# Patient Record
Sex: Male | Born: 1935 | Race: White | Hispanic: No | Smoking: Never smoker
Health system: Southern US, Community
[De-identification: ages and names within clinical notes are randomized; demographics above are authoritative.]

## PROBLEM LIST (undated history)

## (undated) DIAGNOSIS — D649 Anemia, unspecified: Secondary | ICD-10-CM

## (undated) DIAGNOSIS — S129XXA Fracture of neck, unspecified, initial encounter: Secondary | ICD-10-CM

## (undated) DIAGNOSIS — S069X9A Unspecified intracranial injury with loss of consciousness of unspecified duration, initial encounter: Secondary | ICD-10-CM

## (undated) DIAGNOSIS — C61 Malignant neoplasm of prostate: Secondary | ICD-10-CM

## (undated) DIAGNOSIS — I10 Essential (primary) hypertension: Secondary | ICD-10-CM

## (undated) DIAGNOSIS — N400 Enlarged prostate without lower urinary tract symptoms: Secondary | ICD-10-CM

## (undated) DIAGNOSIS — R1310 Dysphagia, unspecified: Secondary | ICD-10-CM

## (undated) DIAGNOSIS — H3530 Unspecified macular degeneration: Secondary | ICD-10-CM

## (undated) DIAGNOSIS — R296 Repeated falls: Secondary | ICD-10-CM

## (undated) DIAGNOSIS — S065X9A Traumatic subdural hemorrhage with loss of consciousness of unspecified duration, initial encounter: Secondary | ICD-10-CM

## (undated) DIAGNOSIS — H409 Unspecified glaucoma: Secondary | ICD-10-CM

## (undated) DIAGNOSIS — F329 Major depressive disorder, single episode, unspecified: Secondary | ICD-10-CM

## (undated) DIAGNOSIS — G4730 Sleep apnea, unspecified: Secondary | ICD-10-CM

## (undated) DIAGNOSIS — N3941 Urge incontinence: Secondary | ICD-10-CM

## (undated) DIAGNOSIS — H401190 Primary open-angle glaucoma, unspecified eye, stage unspecified: Secondary | ICD-10-CM

## (undated) DIAGNOSIS — S0292XA Unspecified fracture of facial bones, initial encounter for closed fracture: Secondary | ICD-10-CM

## (undated) DIAGNOSIS — M47812 Spondylosis without myelopathy or radiculopathy, cervical region: Secondary | ICD-10-CM

## (undated) DIAGNOSIS — S065XAA Traumatic subdural hemorrhage with loss of consciousness status unknown, initial encounter: Secondary | ICD-10-CM

## (undated) DIAGNOSIS — M1990 Unspecified osteoarthritis, unspecified site: Secondary | ICD-10-CM

## (undated) DIAGNOSIS — F0390 Unspecified dementia without behavioral disturbance: Secondary | ICD-10-CM

## (undated) DIAGNOSIS — S069XAA Unspecified intracranial injury with loss of consciousness status unknown, initial encounter: Secondary | ICD-10-CM

## (undated) HISTORY — PX: TURP VAPORIZATION: SUR1397

## (undated) HISTORY — DX: Traumatic subdural hemorrhage with loss of consciousness of unspecified duration, initial encounter: S06.5X9A

## (undated) HISTORY — PX: HERNIA REPAIR: SHX51

## (undated) HISTORY — PX: GALLBLADDER SURGERY: SHX652

## (undated) HISTORY — DX: Primary open-angle glaucoma, unspecified eye, stage unspecified: H40.1190

## (undated) HISTORY — DX: Unspecified macular degeneration: H35.30

## (undated) HISTORY — PX: APPENDECTOMY: SHX54

## (undated) HISTORY — DX: Fracture of neck, unspecified, initial encounter: S12.9XXA

## (undated) HISTORY — DX: Urge incontinence: N39.41

## (undated) HISTORY — DX: Malignant neoplasm of prostate: C61

## (undated) HISTORY — PX: TONSILLECTOMY: SUR1361

## (undated) HISTORY — DX: Unspecified fracture of facial bones, initial encounter for closed fracture: S02.92XA

## (undated) HISTORY — PX: CHOLECYSTECTOMY: SHX55

## (undated) HISTORY — DX: Benign prostatic hyperplasia without lower urinary tract symptoms: N40.0

## (undated) HISTORY — DX: Traumatic subdural hemorrhage with loss of consciousness status unknown, initial encounter: S06.5XAA

---

## 2006-12-10 HISTORY — PX: GLAUCOMA SURGERY: SHX656

## 2009-09-18 ENCOUNTER — Emergency Department: Payer: Self-pay | Admitting: Emergency Medicine

## 2009-11-26 ENCOUNTER — Encounter: Payer: Self-pay | Admitting: Internal Medicine

## 2009-12-09 ENCOUNTER — Encounter: Payer: Self-pay | Admitting: Internal Medicine

## 2010-04-18 ENCOUNTER — Emergency Department: Payer: Self-pay | Admitting: Emergency Medicine

## 2010-07-02 ENCOUNTER — Emergency Department: Payer: Self-pay | Admitting: Emergency Medicine

## 2010-07-24 ENCOUNTER — Ambulatory Visit: Payer: Self-pay | Admitting: Internal Medicine

## 2010-07-25 ENCOUNTER — Encounter: Payer: Self-pay | Admitting: Internal Medicine

## 2010-08-09 ENCOUNTER — Encounter: Payer: Self-pay | Admitting: Internal Medicine

## 2010-09-09 ENCOUNTER — Encounter: Payer: Self-pay | Admitting: Internal Medicine

## 2010-10-02 ENCOUNTER — Ambulatory Visit: Payer: Self-pay | Admitting: Internal Medicine

## 2010-10-10 ENCOUNTER — Encounter: Payer: Self-pay | Admitting: Internal Medicine

## 2010-11-09 ENCOUNTER — Encounter: Payer: Self-pay | Admitting: Internal Medicine

## 2010-12-10 ENCOUNTER — Encounter: Payer: Self-pay | Admitting: Internal Medicine

## 2010-12-29 HISTORY — PX: OTHER SURGICAL HISTORY: SHX169

## 2011-01-09 ENCOUNTER — Encounter: Payer: Self-pay | Admitting: Internal Medicine

## 2011-02-09 ENCOUNTER — Encounter: Payer: Self-pay | Admitting: Internal Medicine

## 2011-02-28 HISTORY — PX: OTHER SURGICAL HISTORY: SHX169

## 2011-03-12 ENCOUNTER — Encounter: Payer: Self-pay | Admitting: Internal Medicine

## 2011-03-30 LAB — BASIC METABOLIC PANEL
Anion Gap: 7 (ref 7–16)
BUN: 22 mg/dL — ABNORMAL HIGH (ref 7–18)
Chloride: 104 mmol/L (ref 98–107)
Co2: 28 mmol/L (ref 21–32)
Creatinine: 0.95 mg/dL (ref 0.60–1.30)
EGFR (African American): >60
Potassium: 3.7 mmol/L (ref 3.5–5.1)
Sodium: 139 mmol/L (ref 136–145)

## 2011-04-09 ENCOUNTER — Encounter: Payer: Self-pay | Admitting: Internal Medicine

## 2011-05-10 ENCOUNTER — Encounter: Payer: Self-pay | Admitting: Internal Medicine

## 2011-05-13 LAB — BASIC METABOLIC PANEL
Anion Gap: 8 (ref 7–16)
Calcium, Total: 8.2 mg/dL — ABNORMAL LOW (ref 8.5–10.1)
Chloride: 107 mmol/L (ref 98–107)
Co2: 27 mmol/L (ref 21–32)
Creatinine: 1.02 mg/dL (ref 0.60–1.30)
EGFR (African American): >60
EGFR (Non-African Amer.): >60
Glucose: 98 mg/dL (ref 65–99)
Sodium: 142 mmol/L (ref 136–145)

## 2011-06-09 ENCOUNTER — Encounter: Payer: Self-pay | Admitting: Internal Medicine

## 2011-07-10 ENCOUNTER — Encounter: Payer: Self-pay | Admitting: Internal Medicine

## 2011-08-09 ENCOUNTER — Encounter: Payer: Self-pay | Admitting: Internal Medicine

## 2011-08-23 DIAGNOSIS — H532 Diplopia: Secondary | ICD-10-CM | POA: Insufficient documentation

## 2011-09-09 ENCOUNTER — Encounter: Payer: Self-pay | Admitting: Internal Medicine

## 2011-10-10 ENCOUNTER — Encounter: Payer: Self-pay | Admitting: Internal Medicine

## 2011-11-09 ENCOUNTER — Encounter: Payer: Self-pay | Admitting: Internal Medicine

## 2011-12-07 LAB — BASIC METABOLIC PANEL
BUN: 24 mg/dL — ABNORMAL HIGH (ref 7–18)
Calcium, Total: 8.2 mg/dL — ABNORMAL LOW (ref 8.5–10.1)
EGFR (Non-African Amer.): >60
Glucose: 89 mg/dL (ref 65–99)
Potassium: 3.4 mmol/L — ABNORMAL LOW (ref 3.5–5.1)

## 2011-12-10 ENCOUNTER — Encounter: Payer: Self-pay | Admitting: Internal Medicine

## 2012-01-09 ENCOUNTER — Encounter: Payer: Self-pay | Admitting: Internal Medicine

## 2012-02-09 ENCOUNTER — Encounter: Payer: Self-pay | Admitting: Internal Medicine

## 2012-03-11 ENCOUNTER — Encounter: Payer: Self-pay | Admitting: Internal Medicine

## 2012-03-27 DIAGNOSIS — N400 Enlarged prostate without lower urinary tract symptoms: Secondary | ICD-10-CM

## 2012-03-27 DIAGNOSIS — R339 Retention of urine, unspecified: Secondary | ICD-10-CM | POA: Insufficient documentation

## 2012-03-27 DIAGNOSIS — N3941 Urge incontinence: Secondary | ICD-10-CM

## 2012-03-27 DIAGNOSIS — R350 Frequency of micturition: Secondary | ICD-10-CM | POA: Insufficient documentation

## 2012-03-27 HISTORY — DX: Benign prostatic hyperplasia without lower urinary tract symptoms: N40.0

## 2012-03-27 HISTORY — DX: Urge incontinence: N39.41

## 2012-04-08 ENCOUNTER — Encounter: Payer: Self-pay | Admitting: Internal Medicine

## 2012-04-27 ENCOUNTER — Ambulatory Visit: Payer: Self-pay

## 2012-05-09 ENCOUNTER — Encounter: Payer: Self-pay | Admitting: Internal Medicine

## 2012-05-22 DIAGNOSIS — N138 Other obstructive and reflux uropathy: Secondary | ICD-10-CM | POA: Insufficient documentation

## 2012-05-22 DIAGNOSIS — C61 Malignant neoplasm of prostate: Secondary | ICD-10-CM

## 2012-05-22 HISTORY — DX: Malignant neoplasm of prostate: C61

## 2012-06-08 ENCOUNTER — Encounter: Payer: Self-pay | Admitting: Internal Medicine

## 2012-07-09 ENCOUNTER — Encounter: Payer: Self-pay | Admitting: Internal Medicine

## 2012-08-08 ENCOUNTER — Encounter: Payer: Self-pay | Admitting: Internal Medicine

## 2012-08-10 LAB — BASIC METABOLIC PANEL
BUN: 26 mg/dL — ABNORMAL HIGH (ref 7–18)
Calcium, Total: 9.1 mg/dL (ref 8.5–10.1)
Creatinine: 1.13 mg/dL (ref 0.60–1.30)
EGFR (African American): >60
EGFR (Non-African Amer.): 59 — ABNORMAL LOW
Osmolality: 286 (ref 275–301)
Potassium: 3.8 mmol/L (ref 3.5–5.1)
Sodium: 141 mmol/L (ref 136–145)

## 2012-09-08 ENCOUNTER — Encounter: Payer: Self-pay | Admitting: Internal Medicine

## 2012-10-09 ENCOUNTER — Encounter: Payer: Self-pay | Admitting: Internal Medicine

## 2012-11-08 ENCOUNTER — Encounter: Payer: Self-pay | Admitting: Internal Medicine

## 2012-12-09 ENCOUNTER — Encounter: Payer: Self-pay | Admitting: Internal Medicine

## 2013-01-08 ENCOUNTER — Encounter: Payer: Self-pay | Admitting: Internal Medicine

## 2013-02-08 ENCOUNTER — Encounter: Payer: Self-pay | Admitting: Internal Medicine

## 2013-03-08 LAB — BASIC METABOLIC PANEL
ANION GAP: 3 — AB (ref 7–16)
BUN: 20 mg/dL — ABNORMAL HIGH (ref 7–18)
CALCIUM: 8.9 mg/dL (ref 8.5–10.1)
CREATININE: 0.99 mg/dL (ref 0.60–1.30)
Chloride: 106 mmol/L (ref 98–107)
Co2: 28 mmol/L (ref 21–32)
EGFR (African American): >60
Glucose: 97 mg/dL (ref 65–99)
Osmolality: 276 (ref 275–301)
POTASSIUM: 3.5 mmol/L (ref 3.5–5.1)
Sodium: 137 mmol/L (ref 136–145)

## 2013-03-11 ENCOUNTER — Encounter: Payer: Self-pay | Admitting: Internal Medicine

## 2013-04-08 ENCOUNTER — Encounter: Payer: Self-pay | Admitting: Internal Medicine

## 2013-05-09 ENCOUNTER — Encounter: Payer: Self-pay | Admitting: Internal Medicine

## 2013-06-08 ENCOUNTER — Encounter: Payer: Self-pay | Admitting: Internal Medicine

## 2013-07-09 ENCOUNTER — Encounter: Payer: Self-pay | Admitting: Internal Medicine

## 2013-07-18 ENCOUNTER — Ambulatory Visit: Payer: Self-pay | Admitting: Gerontology

## 2013-08-08 ENCOUNTER — Encounter: Payer: Self-pay | Admitting: Internal Medicine

## 2013-09-06 LAB — BASIC METABOLIC PANEL
Anion Gap: 8 (ref 7–16)
BUN: 19 mg/dL — ABNORMAL HIGH (ref 7–18)
CALCIUM: 8.1 mg/dL — AB (ref 8.5–10.1)
CREATININE: 1.07 mg/dL (ref 0.60–1.30)
Chloride: 109 mmol/L — ABNORMAL HIGH (ref 98–107)
Co2: 24 mmol/L (ref 21–32)
EGFR (African American): >60
EGFR (Non-African Amer.): >60
Glucose: 121 mg/dL — ABNORMAL HIGH (ref 65–99)
Osmolality: 285 (ref 275–301)
Potassium: 3.5 mmol/L (ref 3.5–5.1)
SODIUM: 141 mmol/L (ref 136–145)

## 2013-09-08 ENCOUNTER — Encounter: Payer: Self-pay | Admitting: Internal Medicine

## 2013-10-09 ENCOUNTER — Encounter: Payer: Self-pay | Admitting: Internal Medicine

## 2013-11-08 ENCOUNTER — Encounter: Payer: Self-pay | Admitting: Internal Medicine

## 2013-12-09 ENCOUNTER — Encounter: Payer: Self-pay | Admitting: Internal Medicine

## 2014-01-08 ENCOUNTER — Encounter: Payer: Self-pay | Admitting: Internal Medicine

## 2014-02-08 ENCOUNTER — Encounter: Payer: Self-pay | Admitting: Internal Medicine

## 2014-03-11 ENCOUNTER — Encounter: Payer: Self-pay | Admitting: Internal Medicine

## 2014-04-09 ENCOUNTER — Encounter
Admit: 2014-04-09 | Disposition: A | Discharge status: Home / Self Care | Payer: Self-pay | Attending: Internal Medicine | Admitting: Internal Medicine

## 2014-05-01 DIAGNOSIS — H4010X0 Unspecified open-angle glaucoma, stage unspecified: Secondary | ICD-10-CM | POA: Insufficient documentation

## 2014-05-10 ENCOUNTER — Encounter
Admit: 2014-05-10 | Disposition: A | Discharge status: Home / Self Care | Payer: Self-pay | Attending: Internal Medicine | Admitting: Internal Medicine

## 2014-05-28 LAB — BASIC METABOLIC PANEL
Anion Gap: 5 — ABNORMAL LOW (ref 7–16)
BUN: 28 mg/dL — ABNORMAL HIGH
CALCIUM: 7.8 mg/dL — AB
Chloride: 109 mmol/L
Co2: 25 mmol/L
Creatinine: 0.81 mg/dL
EGFR (African American): >60
Glucose: 86 mg/dL
POTASSIUM: 3.7 mmol/L
Sodium: 139 mmol/L

## 2014-06-09 ENCOUNTER — Encounter
Admission: RE | Admit: 2014-06-09 | Discharge: 2014-06-09 | Disposition: A | Discharge status: Home / Self Care | Payer: Medicare Other | Source: Ambulatory Visit | Attending: Internal Medicine | Admitting: Internal Medicine

## 2014-06-09 DIAGNOSIS — R350 Frequency of micturition: Secondary | ICD-10-CM | POA: Insufficient documentation

## 2014-06-09 DIAGNOSIS — R8290 Unspecified abnormal findings in urine: Secondary | ICD-10-CM | POA: Insufficient documentation

## 2014-06-09 DIAGNOSIS — M545 Low back pain: Secondary | ICD-10-CM | POA: Insufficient documentation

## 2014-06-22 DIAGNOSIS — M545 Low back pain: Secondary | ICD-10-CM | POA: Diagnosis not present

## 2014-06-22 DIAGNOSIS — R8290 Unspecified abnormal findings in urine: Secondary | ICD-10-CM | POA: Diagnosis not present

## 2014-06-22 DIAGNOSIS — R350 Frequency of micturition: Secondary | ICD-10-CM | POA: Diagnosis not present

## 2014-06-22 LAB — URINALYSIS COMPLETE WITH MICROSCOPIC (ARMC ONLY)
Bacteria, UA: NONE SEEN
Bilirubin Urine: NEGATIVE
Glucose, UA: NEGATIVE mg/dL
HGB URINE DIPSTICK: NEGATIVE
Ketones, ur: NEGATIVE mg/dL
Leukocytes, UA: NEGATIVE
Nitrite: NEGATIVE
Protein, ur: NEGATIVE mg/dL
RBC / HPF: NONE SEEN RBC/hpf (ref 0–5)
Specific Gravity, Urine: 1.019 (ref 1.005–1.030)
pH: 5 (ref 5.0–8.0)

## 2014-06-24 LAB — URINE CULTURE

## 2014-09-09 ENCOUNTER — Encounter
Admission: RE | Admit: 2014-09-09 | Discharge: 2014-09-09 | Disposition: A | Discharge status: Home / Self Care | Payer: Medicare Other | Source: Ambulatory Visit | Attending: Internal Medicine | Admitting: Internal Medicine

## 2014-10-11 ENCOUNTER — Encounter
Admission: RE | Admit: 2014-10-11 | Discharge: 2014-10-11 | Disposition: A | Discharge status: Home / Self Care | Payer: Medicare Other | Source: Ambulatory Visit | Attending: Internal Medicine | Admitting: Internal Medicine

## 2014-11-09 ENCOUNTER — Encounter
Admission: RE | Admit: 2014-11-09 | Discharge: 2014-11-09 | Disposition: A | Discharge status: Home / Self Care | Payer: Medicare Other | Source: Ambulatory Visit | Attending: Internal Medicine | Admitting: Internal Medicine

## 2014-12-10 ENCOUNTER — Encounter
Admission: RE | Admit: 2014-12-10 | Discharge: 2014-12-10 | Disposition: A | Discharge status: Home / Self Care | Payer: Medicare Other | Source: Ambulatory Visit | Attending: Internal Medicine | Admitting: Internal Medicine

## 2014-12-10 DIAGNOSIS — M549 Dorsalgia, unspecified: Secondary | ICD-10-CM | POA: Insufficient documentation

## 2014-12-10 DIAGNOSIS — R350 Frequency of micturition: Secondary | ICD-10-CM | POA: Insufficient documentation

## 2014-12-10 LAB — BASIC METABOLIC PANEL
ANION GAP: 7 (ref 5–15)
BUN: 28 mg/dL — AB (ref 6–20)
CHLORIDE: 109 mmol/L (ref 101–111)
CO2: 23 mmol/L (ref 22–32)
Calcium: 8.3 mg/dL — ABNORMAL LOW (ref 8.9–10.3)
Creatinine, Ser: 1 mg/dL (ref 0.61–1.24)
GFR calc Af Amer: >60 mL/min (ref >60)
Glucose, Bld: 97 mg/dL (ref 65–99)
POTASSIUM: 3.8 mmol/L (ref 3.5–5.1)
SODIUM: 139 mmol/L (ref 135–145)

## 2015-01-09 ENCOUNTER — Encounter
Admission: RE | Admit: 2015-01-09 | Discharge: 2015-01-09 | Disposition: A | Discharge status: Home / Self Care | Payer: Medicare Other | Source: Ambulatory Visit | Attending: Internal Medicine | Admitting: Internal Medicine

## 2015-02-04 DIAGNOSIS — G4730 Sleep apnea, unspecified: Secondary | ICD-10-CM

## 2015-02-04 DIAGNOSIS — S129XXA Fracture of neck, unspecified, initial encounter: Secondary | ICD-10-CM | POA: Insufficient documentation

## 2015-02-04 DIAGNOSIS — G4733 Obstructive sleep apnea (adult) (pediatric): Secondary | ICD-10-CM | POA: Insufficient documentation

## 2015-02-04 HISTORY — DX: Sleep apnea, unspecified: G47.30

## 2015-02-05 DIAGNOSIS — F32A Depression, unspecified: Secondary | ICD-10-CM

## 2015-02-05 DIAGNOSIS — I10 Essential (primary) hypertension: Secondary | ICD-10-CM | POA: Insufficient documentation

## 2015-02-05 DIAGNOSIS — F39 Unspecified mood [affective] disorder: Secondary | ICD-10-CM | POA: Insufficient documentation

## 2015-02-05 HISTORY — DX: Depression, unspecified: F32.A

## 2015-02-09 ENCOUNTER — Encounter
Admission: RE | Admit: 2015-02-09 | Discharge: 2015-02-09 | Disposition: A | Discharge status: Home / Self Care | Payer: Medicare Other | Source: Ambulatory Visit | Attending: Internal Medicine | Admitting: Internal Medicine

## 2015-03-12 ENCOUNTER — Encounter
Admission: RE | Admit: 2015-03-12 | Discharge: 2015-03-12 | Disposition: A | Discharge status: Home / Self Care | Payer: Medicare Other | Source: Ambulatory Visit | Attending: Internal Medicine | Admitting: Internal Medicine

## 2015-03-17 DIAGNOSIS — F329 Major depressive disorder, single episode, unspecified: Secondary | ICD-10-CM | POA: Diagnosis not present

## 2015-03-17 DIAGNOSIS — Z66 Do not resuscitate: Secondary | ICD-10-CM | POA: Diagnosis not present

## 2015-03-17 DIAGNOSIS — H353190 Nonexudative age-related macular degeneration, unspecified eye, stage unspecified: Secondary | ICD-10-CM | POA: Diagnosis not present

## 2015-03-17 DIAGNOSIS — Z86718 Personal history of other venous thrombosis and embolism: Secondary | ICD-10-CM | POA: Diagnosis not present

## 2015-03-17 DIAGNOSIS — N401 Enlarged prostate with lower urinary tract symptoms: Secondary | ICD-10-CM | POA: Diagnosis not present

## 2015-03-17 DIAGNOSIS — N39498 Other specified urinary incontinence: Secondary | ICD-10-CM | POA: Diagnosis not present

## 2015-03-17 DIAGNOSIS — I10 Essential (primary) hypertension: Secondary | ICD-10-CM | POA: Diagnosis not present

## 2015-03-17 DIAGNOSIS — G3184 Mild cognitive impairment, so stated: Secondary | ICD-10-CM | POA: Diagnosis not present

## 2015-03-17 DIAGNOSIS — H401113 Primary open-angle glaucoma, right eye, severe stage: Secondary | ICD-10-CM | POA: Diagnosis not present

## 2015-03-17 HISTORY — PX: AQUEOUS SHUNT: SHX6305

## 2015-03-21 DIAGNOSIS — I509 Heart failure, unspecified: Secondary | ICD-10-CM | POA: Diagnosis not present

## 2015-03-21 DIAGNOSIS — M159 Polyosteoarthritis, unspecified: Secondary | ICD-10-CM | POA: Diagnosis not present

## 2015-03-21 DIAGNOSIS — F329 Major depressive disorder, single episode, unspecified: Secondary | ICD-10-CM | POA: Diagnosis not present

## 2015-03-31 DIAGNOSIS — F0280 Dementia in other diseases classified elsewhere without behavioral disturbance: Secondary | ICD-10-CM | POA: Diagnosis not present

## 2015-03-31 DIAGNOSIS — G301 Alzheimer's disease with late onset: Secondary | ICD-10-CM | POA: Diagnosis not present

## 2015-04-09 ENCOUNTER — Encounter
Admission: RE | Admit: 2015-04-09 | Discharge: 2015-04-09 | Disposition: A | Discharge status: Home / Self Care | Payer: Medicare Other | Source: Ambulatory Visit | Attending: Internal Medicine | Admitting: Internal Medicine

## 2015-04-14 DIAGNOSIS — F0280 Dementia in other diseases classified elsewhere without behavioral disturbance: Secondary | ICD-10-CM | POA: Insufficient documentation

## 2015-04-14 DIAGNOSIS — G309 Alzheimer's disease, unspecified: Secondary | ICD-10-CM

## 2015-05-10 ENCOUNTER — Encounter
Admission: RE | Admit: 2015-05-10 | Discharge: 2015-05-10 | Disposition: A | Discharge status: Home / Self Care | Payer: Medicare Other | Source: Ambulatory Visit | Attending: Internal Medicine | Admitting: Internal Medicine

## 2015-06-09 ENCOUNTER — Encounter
Admission: RE | Admit: 2015-06-09 | Discharge: 2015-06-09 | Disposition: A | Discharge status: Home / Self Care | Payer: Medicare Other | Source: Ambulatory Visit | Attending: Internal Medicine | Admitting: Internal Medicine

## 2015-06-16 DIAGNOSIS — F0390 Unspecified dementia without behavioral disturbance: Secondary | ICD-10-CM | POA: Diagnosis not present

## 2015-06-16 DIAGNOSIS — I509 Heart failure, unspecified: Secondary | ICD-10-CM | POA: Diagnosis not present

## 2015-06-16 DIAGNOSIS — I10 Essential (primary) hypertension: Secondary | ICD-10-CM | POA: Diagnosis not present

## 2015-06-16 DIAGNOSIS — G4700 Insomnia, unspecified: Secondary | ICD-10-CM | POA: Diagnosis not present

## 2015-06-16 DIAGNOSIS — F329 Major depressive disorder, single episode, unspecified: Secondary | ICD-10-CM | POA: Diagnosis not present

## 2015-07-10 ENCOUNTER — Encounter
Admission: RE | Admit: 2015-07-10 | Discharge: 2015-07-10 | Disposition: A | Discharge status: Home / Self Care | Payer: Medicare Other | Source: Ambulatory Visit | Attending: Internal Medicine | Admitting: Internal Medicine

## 2015-08-05 ENCOUNTER — Encounter: Payer: Self-pay | Admitting: Occupational Medicine

## 2015-08-05 ENCOUNTER — Emergency Department
Admission: EM | Admit: 2015-08-05 | Discharge: 2015-08-06 | Disposition: A | Discharge status: Home / Self Care | Payer: Medicare Other | Attending: Emergency Medicine | Admitting: Emergency Medicine

## 2015-08-05 ENCOUNTER — Emergency Department: Payer: Medicare Other

## 2015-08-05 DIAGNOSIS — M1990 Unspecified osteoarthritis, unspecified site: Secondary | ICD-10-CM | POA: Diagnosis not present

## 2015-08-05 DIAGNOSIS — Z79899 Other long term (current) drug therapy: Secondary | ICD-10-CM | POA: Diagnosis not present

## 2015-08-05 DIAGNOSIS — F329 Major depressive disorder, single episode, unspecified: Secondary | ICD-10-CM | POA: Insufficient documentation

## 2015-08-05 DIAGNOSIS — H401133 Primary open-angle glaucoma, bilateral, severe stage: Secondary | ICD-10-CM | POA: Diagnosis not present

## 2015-08-05 DIAGNOSIS — Z7982 Long term (current) use of aspirin: Secondary | ICD-10-CM | POA: Insufficient documentation

## 2015-08-05 DIAGNOSIS — I10 Essential (primary) hypertension: Secondary | ICD-10-CM | POA: Diagnosis not present

## 2015-08-05 DIAGNOSIS — Z7951 Long term (current) use of inhaled steroids: Secondary | ICD-10-CM | POA: Insufficient documentation

## 2015-08-05 DIAGNOSIS — K625 Hemorrhage of anus and rectum: Secondary | ICD-10-CM | POA: Diagnosis not present

## 2015-08-05 DIAGNOSIS — R319 Hematuria, unspecified: Secondary | ICD-10-CM | POA: Insufficient documentation

## 2015-08-05 HISTORY — DX: Unspecified osteoarthritis, unspecified site: M19.90

## 2015-08-05 HISTORY — DX: Sleep apnea, unspecified: G47.30

## 2015-08-05 HISTORY — DX: Anemia, unspecified: D64.9

## 2015-08-05 HISTORY — DX: Benign prostatic hyperplasia without lower urinary tract symptoms: N40.0

## 2015-08-05 HISTORY — DX: Repeated falls: R29.6

## 2015-08-05 HISTORY — DX: Major depressive disorder, single episode, unspecified: F32.9

## 2015-08-05 HISTORY — DX: Unspecified glaucoma: H40.9

## 2015-08-05 HISTORY — DX: Unspecified intracranial injury with loss of consciousness status unknown, initial encounter: S06.9XAA

## 2015-08-05 HISTORY — DX: Essential (primary) hypertension: I10

## 2015-08-05 HISTORY — DX: Spondylosis without myelopathy or radiculopathy, cervical region: M47.812

## 2015-08-05 HISTORY — DX: Dysphagia, unspecified: R13.10

## 2015-08-05 HISTORY — DX: Unspecified dementia, unspecified severity, without behavioral disturbance, psychotic disturbance, mood disturbance, and anxiety: F03.90

## 2015-08-05 HISTORY — DX: Unspecified intracranial injury with loss of consciousness of unspecified duration, initial encounter: S06.9X9A

## 2015-08-05 LAB — CBC WITH DIFFERENTIAL/PLATELET
BASOS ABS: 0 10*3/uL (ref 0–0.1)
BASOS PCT: 1 %
EOS ABS: 0.2 10*3/uL (ref 0–0.7)
Eosinophils Relative: 2 %
HCT: 36 % — ABNORMAL LOW (ref 40.0–52.0)
HEMOGLOBIN: 12.2 g/dL — AB (ref 13.0–18.0)
Lymphocytes Relative: 20 %
Lymphs Abs: 1.9 10*3/uL (ref 1.0–3.6)
MCH: 30.5 pg (ref 26.0–34.0)
MCHC: 33.9 g/dL (ref 32.0–36.0)
MCV: 89.8 fL (ref 80.0–100.0)
Monocytes Absolute: 0.7 10*3/uL (ref 0.2–1.0)
Monocytes Relative: 8 %
NEUTROS PCT: 69 %
Neutro Abs: 6.5 10*3/uL (ref 1.4–6.5)
Platelets: 254 10*3/uL (ref 150–440)
RBC: 4.01 MIL/uL — AB (ref 4.40–5.90)
RDW: 14.1 % (ref 11.5–14.5)
WBC: 9.4 10*3/uL (ref 3.8–10.6)

## 2015-08-05 LAB — URINALYSIS COMPLETE WITH MICROSCOPIC (ARMC ONLY)
BACTERIA UA: NONE SEEN
SQUAMOUS EPITHELIAL / LPF: NONE SEEN
Specific Gravity, Urine: 1.018 (ref 1.005–1.030)

## 2015-08-05 LAB — COMPREHENSIVE METABOLIC PANEL
ALBUMIN: 3.9 g/dL (ref 3.5–5.0)
ALT: 20 U/L (ref 17–63)
AST: 19 U/L (ref 15–41)
Alkaline Phosphatase: 82 U/L (ref 38–126)
Anion gap: 8 (ref 5–15)
BUN: 28 mg/dL — AB (ref 6–20)
CALCIUM: 8.8 mg/dL — AB (ref 8.9–10.3)
CO2: 24 mmol/L (ref 22–32)
Chloride: 108 mmol/L (ref 101–111)
Creatinine, Ser: 1.28 mg/dL — ABNORMAL HIGH (ref 0.61–1.24)
GFR calc non Af Amer: 48 mL/min — ABNORMAL LOW (ref >60)
GFR, EST AFRICAN AMERICAN: 56 mL/min — AB (ref >60)
Glucose, Bld: 106 mg/dL — ABNORMAL HIGH (ref 65–99)
Potassium: 3.8 mmol/L (ref 3.5–5.1)
Sodium: 140 mmol/L (ref 135–145)
TOTAL PROTEIN: 6.9 g/dL (ref 6.5–8.1)
Total Bilirubin: 0.4 mg/dL (ref 0.3–1.2)

## 2015-08-05 LAB — PROTIME-INR
INR: 1.04
Prothrombin Time: 13.8 seconds (ref 11.4–15.0)

## 2015-08-05 LAB — APTT: APTT: 30 s (ref 24–36)

## 2015-08-05 MED ORDER — ACETAMINOPHEN 325 MG PO TABS
650.0000 mg | ORAL_TABLET | Freq: Once | ORAL | Status: AC
  Administered 2015-08-05: 650 mg via ORAL
  Filled 2015-08-05: qty 2

## 2015-08-05 MED ORDER — DEXTROSE 5 % IV SOLN
1.0000 g | Freq: Once | INTRAVENOUS | Status: AC
  Administered 2015-08-05: 1 g via INTRAVENOUS
  Filled 2015-08-05: qty 10

## 2015-08-05 MED ORDER — SODIUM CHLORIDE 0.9 % IV BOLUS (SEPSIS)
500.0000 mL | Freq: Once | INTRAVENOUS | Status: AC
  Administered 2015-08-05: 500 mL via INTRAVENOUS

## 2015-08-05 NOTE — ED Notes (Signed)
Pt presents from [facility] on the AL unit. Pt states he is fine. Staff noticed 2 diapes with blood in it. Staff thinks its coming from urine passed clots today twice. Staff sent a U/A today.

## 2015-08-05 NOTE — ED Notes (Signed)
Placed a urethral cath 10fr. Irrigation going around cath MD aware ordered a bigger cath to be placed.

## 2015-08-05 NOTE — ED Notes (Signed)
[employer] NP updated on condition. [facility] updated on condition.

## 2015-08-05 NOTE — ED Notes (Signed)
MD at bedside. Holding irrgation at this time due to drainage clear in foley tubing. Will monitor.

## 2015-08-05 NOTE — ED Notes (Signed)
Pt c/o pain to tip of penis from the cath MD aware.

## 2015-08-06 DIAGNOSIS — K625 Hemorrhage of anus and rectum: Secondary | ICD-10-CM | POA: Diagnosis not present

## 2015-08-06 MED ORDER — CEPHALEXIN 500 MG PO CAPS: 500.0000 mg | ORAL_CAPSULE | Freq: Four times a day (QID) | ORAL | Status: AC

## 2015-08-06 NOTE — ED Notes (Signed)
Pt still having blood and clots in foley bag after irrigation of NS 1249ml MD made aware.

## 2015-08-06 NOTE — ED Provider Notes (Signed)
Time Seen: Approximately 2050  I have reviewed the triage notes  Chief Complaint: Hematuria   History of Present Illness: Tony [name] is a 82 y.o. male who presents with painless hematuria. Patient was transported here from [place]. They have noticed some blood in it and his. patient thinks she passed some clots in his urine twice today. He does have a urologist and has a scheduled appointment coming up in 1 week. Patient has a history of dementia but can answer some questions appropriately. He denies any significant pain at this time.   Past Medical History  Diagnosis Date  . Dementia   . Dysphagia   . Spondylosis of cervical joint   . Osteoarthritis   . Glaucoma   . Hypertension   . Depression   . Sleep apnea   . Anemia   . Benign prostatic hyperplasia   . Falling   . Traumatic brain injury ([place])   . Intracranial injury ([place])     There are no active problems to display for this patient.   Past Surgical History  Procedure Laterality Date  . Appendectomy    . Tonsillectomy    . Hernia repair      Past Surgical History  Procedure Laterality Date  . Appendectomy    . Tonsillectomy    . Hernia repair      Current Outpatient Rx  Name  Route  Sig  Dispense  Refill  . acetaminophen (TYLENOL) 325 MG tablet   Oral   Take 650 mg by mouth every 6 (six) hours as needed.         Marland Kitchen amLODipine (NORVASC) 5 MG tablet   Oral   Take 7.5 mg by mouth daily.         Marland Kitchen aspirin EC 81 MG tablet   Oral   Take 81 mg by mouth daily.         Marland Kitchen azelastine (ASTELIN) 0.1 % nasal spray   Each Nare   Place 2 sprays into both nostrils at bedtime. Use in each nostril as directed         . bimatoprost (LUMIGAN) 0.01 % SOLN   Left Eye   Place 1 drop into the left eye at bedtime.         . brimonidine (ALPHAGAN) 0.2 % ophthalmic solution   Left Eye   Place 1 drop into the left eye 2 (two) times daily.         . carvedilol (COREG) 12.5 MG tablet    Oral   Take 12.5 mg by mouth 2 (two) times daily with a meal.         . citalopram (CELEXA) 20 MG tablet   Oral   Take 20 mg by mouth daily.         . coal tar (NEUTROGENA T-GEL) 0.5 % shampoo   Topical   Apply topically once a week.         . donepezil (ARICEPT) 10 MG tablet   Oral   Take 10 mg by mouth at bedtime.         . dorzolamide-timolol (COSOPT) 22.3-6.8 MG/ML ophthalmic solution   Both Eyes   Place 1 drop into both eyes 2 (two) times daily.         Marland Kitchen loperamide (IMODIUM) 2 MG capsule   Oral   Take 2-4 mg by mouth as needed for diarrhea or loose stools.         . mometasone (NASONEX) 50  MCG/ACT nasal spray   Nasal   Place 2 sprays into the nose daily.         . potassium chloride (MICRO-K) 10 MEQ CR capsule   Oral   Take 10 mEq by mouth daily.         . tamsulosin (FLOMAX) 0.4 MG CAPS capsule   Oral   Take 0.4 mg by mouth daily.         Marland Kitchen torsemide (DEMADEX) 5 MG tablet   Oral   Take 5 mg by mouth daily.         . traMADol (ULTRAM) 50 MG tablet   Oral   Take 50 mg by mouth every 4 (four) hours as needed.         . traZODone (DESYREL) 50 MG tablet   Oral   Take 50 mg by mouth at bedtime as needed for sleep.         . cephALEXin (KEFLEX) 500 MG capsule   Oral   Take 1 capsule (500 mg total) by mouth 4 (four) times daily.   28 capsule   0     Allergies:  Review of patient's allergies indicates no known allergies.  Family History: History reviewed. No pertinent family history.  Social History: Social History  Substance Use Topics  . Smoking status: Never Smoker   . Smokeless tobacco: None  . Alcohol Use: 0.6 oz/week    1 Cans of beer per week     Comment: day     Review of Systems:   10 point review of systems was performed and was otherwise negative: Review of systems taken through the medical record and also from the patient Constitutional: No fever Eyes: No visual disturbances ENT: No sore throat, ear  pain Cardiac: No chest pain Respiratory: No shortness of breath, wheezing, or stridor Abdomen: No abdominal pain, no vomiting, No diarrhea Endocrine: No weight loss, No night sweats Extremities: No peripheral edema, cyanosis Skin: No rashes, easy bruising Neurologic: No focal weakness, trouble with speech or swollowing Urologic: Positive hematuria with clots Physical Exam:  ED Triage Vitals  Enc Vitals Group     BP 08/05/15 2031 155/66 mmHg     Pulse Rate 08/05/15 2031 68     Resp 08/05/15 2031 18     Temp 08/05/15 2031 98.6 F (37 C)     Temp Source 08/05/15 2031 Oral     SpO2 08/05/15 2024 97 %     Weight 08/05/15 2031 166 lb 4.8 oz (75.433 kg)     Height 08/05/15 2031 5\' 4"  (1.626 m)     Head Cir --      Peak Flow --      Pain Score --      Pain Loc --      Pain Edu? --      Excl. in [place]? --     General: Awake , Alert , and Oriented times2. Glasgow Coma Scale 15 Head: Normal cephalic , atraumatic Eyes: Pupils equal , round, reactive to light Nose/Throat: No nasal drainage, patent upper airway without erythema or exudate.  Neck: Supple, Full range of motion, No anterior adenopathy or palpable thyroid masses Lungs: Clear to ascultation without wheezes , rhonchi, or rales Heart: Regular rate, regular rhythm without murmurs , gallops , or rubs Abdomen: Soft, non tender without rebound, guarding , or rigidity; bowel sounds positive and symmetric in all 4 quadrants. No organomegaly .        Extremities: 2 plus symmetric  pulses. No edema, clubbing or cyanosis Neurologic: normal ambulation, Motor symmetric without deficits, sensory intact Skin: warm, dry, no rashes Blood at the penile meatus. No testicular pain or masses  Labs:   All laboratory work was reviewed including any pertinent negatives or positives listed below:  Labs Reviewed  [place] ([place]) - Abnormal; Notable for the following:    Color, Urine RED (*)    APPearance CLOUDY (*)     Glucose, UA   (*)    Value: TEST NOT REPORTED DUE TO COLOR INTERFERENCE OF URINE PIGMENT   Bilirubin Urine   (*)    Value: TEST NOT REPORTED DUE TO COLOR INTERFERENCE OF URINE PIGMENT   Ketones, ur   (*)    Value: TEST NOT REPORTED DUE TO COLOR INTERFERENCE OF URINE PIGMENT   Hgb urine dipstick   (*)    Value: TEST NOT REPORTED DUE TO COLOR INTERFERENCE OF URINE PIGMENT   Protein, ur   (*)    Value: TEST NOT REPORTED DUE TO COLOR INTERFERENCE OF URINE PIGMENT   Nitrite   (*)    Value: TEST NOT REPORTED DUE TO COLOR INTERFERENCE OF URINE PIGMENT   Leukocytes, UA   (*)    Value: TEST NOT REPORTED DUE TO COLOR INTERFERENCE OF URINE PIGMENT   All other components within normal limits  CBC WITH DIFFERENTIAL/PLATELET - Abnormal; Notable for the following:    RBC 4.01 (*)    Hemoglobin 12.2 (*)    HCT 36.0 (*)    All other components within normal limits  COMPREHENSIVE METABOLIC PANEL - Abnormal; Notable for the following:    Glucose, Bld 106 (*)    BUN 28 (*)    Creatinine, Ser 1.28 (*)    Calcium 8.8 (*)    GFR calc non Af Amer 48 (*)    GFR calc Af Amer 56 (*)    All other components within normal limits  URINE CULTURE  APTT  PROTIME-INR      CT RENAL STONE STUDY (Final result) Result time: 08/05/15 21:59:18   Final result by Rad Results In Interface (08/05/15 21:59:18)   Narrative:   CLINICAL DATA: Hematuria.  EXAM: CT ABDOMEN AND PELVIS WITHOUT CONTRAST  TECHNIQUE: Multidetector CT imaging of the abdomen and pelvis was performed following the standard protocol without IV contrast.  COMPARISON: None.  FINDINGS: Atelectasis seen in the lingula. Coronary artery calcifications are noted. Lung bases are otherwise unremarkable. No free air or free fluid.  No renal stones or hydronephrosis. A tiny exophytic nodule off the left kidney is too small to characterize but may simply represent a hyperdense cyst. This exophytic nodule measures 8 mm on series 2, image  39. No other masses. No ureteral stones or ureterectasis.  The patient is status post cholecystectomy. The liver, spleen, adrenal glands, and pancreas are unremarkable. Atherosclerotic changes seen in the non aneurysmal aorta. No adenopathy. The stomach and small bowel are normal. The colon demonstrates diverticulosis without diverticulitis. The patient is status post appendectomy.  High attenuation material centered in the right side of the bladder is consistent with blood products. An underlying source is not definitely seen. There is mild increased attenuation in the fat around the bladder however. No adenopathy or mass. No other abnormalities seen in the pelvis.  The visualized bones demonstrate degenerative changes.  IMPRESSION: 1. High attenuation centered in the right side of the bladder consistent with blood products. Mild increased attenuation of fat around the bladder raises the possibility  of an inflammatory or infectious etiology. No stones or suspicious masses are seen. The small exophytic nodule off the left kidney is too small to characterize but may simply be a hyperdense cyst. 2. Atherosclerotic change in the abdominal aorta.   Electronically Signed By: Dorise Bullion III M.D On: 08/05/2015 21:59    ED Course: * Patient's stay here showed some improvement patient had a Foley catheter and had some drainage of some large clots and then was irrigated with normal saline initially cleared and then some clots returned though he still seems to be passing the clots and denies any pain other than from the catheter site. IV antibiotics and his urine was cultured as it is unknown whether or not there is associated urinary tract infection or hemorrhagic cystitis. The patient has a urology consultation upcoming with left the Foley catheter in place.   Assessment: Gross hematuria Final Clinical Impression:   Final diagnoses:  Hematuria     Plan: * Outpatient  management The patient's power of attorney is a Designer, jewellery. Be of understanding and the nursing staff here consulted with the nursing staff there and advised them on how to flush the catheter if it becomes clotted off again. If flushing does not become successful then they may have to return to the emergency department. I placed patient on Keflex for possible infection with urine culture pending           Daymon Larsen, MD 08/06/15 [phone]

## 2015-08-06 NOTE — Discharge Instructions (Signed)
Hematuria, Adult Hematuria is blood in your urine. It can be caused by a bladder infection, kidney infection, prostate infection, kidney stone, or cancer of your urinary tract. Infections can usually be treated with medicine, and a kidney stone usually will pass through your urine. If neither of these is the cause of your hematuria, further workup to find out the reason may be needed. It is very important that you tell your health care provider about any blood you see in your urine, even if the blood stops without treatment or happens without causing pain. Blood in your urine that happens and then stops and then happens again can be a symptom of a very serious condition. Also, pain is not a symptom in the initial stages of many urinary cancers. HOME CARE INSTRUCTIONS   Drink lots of fluid, 3-4 quarts a day. If you have been diagnosed with an infection, cranberry juice is especially recommended, in addition to large amounts of water.  Avoid caffeine, tea, and carbonated beverages because they tend to irritate the bladder.  Avoid alcohol because it may irritate the prostate.  Take all medicines as directed by your health care provider.  If you were prescribed an antibiotic medicine, finish it all even if you start to feel better.  If you have been diagnosed with a kidney stone, follow your health care provider's instructions regarding straining your urine to catch the stone.  Empty your bladder often. Avoid holding urine for long periods of time.  After a bowel movement, women should cleanse front to back. Use each tissue only once.  Empty your bladder before and after sexual intercourse if you are a male. SEEK MEDICAL CARE IF:  You develop back pain.  You have a fever.  You have a feeling of sickness in your stomach (nausea) or vomiting.  Your symptoms are not better in 3 days. Return sooner if you are getting worse. SEEK IMMEDIATE MEDICAL CARE IF:   You develop severe vomiting and  are unable to keep the medicine down.  You develop severe back or abdominal pain despite taking your medicines.  You begin passing a large amount of blood or clots in your urine.  You feel extremely weak or faint, or you pass out. MAKE SURE YOU:   Understand these instructions.  Will watch your condition.  Will get help right away if you are not doing well or get worse.   This information is not intended to replace advice given to you by your health care provider. Make sure you discuss any questions you have with your health care provider.   Document Released: 01/25/2005 Document Revised: 02/15/2014 Document Reviewed: 09/25/2012 Elsevier Interactive Patient Education [employer].  Please return immediately if condition worsens. Please contact her primary physician or the physician you were given for referral. If you have any specialist physicians involved in her treatment and plan please also contact them. Thank you for using [facility].  Irrigate as needed , if foley not draining. Irrigate with 500 ml  Normal Saline. If does not drain after irrigation then return to ED. Foley will continue to drain clots. Please establish urgent Urology follow up.

## 2015-08-06 NOTE — ED Notes (Addendum)
Pt dirty with stool on him. Pt cleaned up. Foley drainage bloody MD aware sent back to facility. Pts foley drainage bag changed. Pt getting frustrated pain to penis tip from cath. Occasional bladder spasms. MD aware .

## 2015-08-08 ENCOUNTER — Non-Acute Institutional Stay (SKILLED_NURSING_FACILITY): Payer: Medicare Other | Admitting: Gerontology

## 2015-08-08 DIAGNOSIS — R319 Hematuria, unspecified: Secondary | ICD-10-CM | POA: Diagnosis not present

## 2015-08-08 DIAGNOSIS — N4889 Other specified disorders of penis: Secondary | ICD-10-CM

## 2015-08-08 DIAGNOSIS — N390 Urinary tract infection, site not specified: Secondary | ICD-10-CM | POA: Diagnosis not present

## 2015-08-08 LAB — URINE CULTURE

## 2015-08-08 NOTE — Progress Notes (Signed)
Location:  [facility]:  SNF [phone]) Provider:  Toni Arthurs, NP-C  Kirk Ruths., MD  Patient Care Team: Kirk Ruths, MD as PCP - General (Internal Medicine)  Extended Emergency Contact Information Primary Emergency Contact: [facility] Address: [address] [place] [address] [name] of [place] Phone: [phone] Work Phone: [phone] Mobile Phone: [phone] Relation: Son Secondary Emergency Contact: Nelia Shi Address: [address]          [place], [place] [address] [name] of [place] Phone: [phone] Work Phone: [phone] Mobile Phone: [phone] Relation: Daughter  Code Status:  DNR Goals of care: Advanced Directive information Advanced Directives 08/05/2015  Does patient have an advance directive? Yes  Does patient want to make changes to advanced directive? No - Patient declined  Copy of advanced directive(s) in chart? No - copy requested     Chief Complaint  Patient presents with  . Penis Pain    HPI:  Pt is a 82 y.o. male seen today for an acute visit for f/u after visit to the ED for hematuria. Pt was passing large amounts of blood with urine. Soaked through 2 briefs in an hours time. Staff very concerned. Sent to ED. ED diagnosed UTI, placed foley and recommended urology f/u. He was sent back with an rx for Keflex. Cultures were still pending at the time. Pt having bladder spasms now and penile pain. Foley, large diameter, in place. Hematuria in foley bag. Pt was dementia so more complete ROS difficult.    Past Medical History  Diagnosis Date  . Dementia   . Dysphagia   . Spondylosis of cervical joint   . Osteoarthritis   . Glaucoma   . Hypertension   . Depression   . Sleep apnea   . Anemia   . Benign prostatic hyperplasia   . Falling   . Traumatic brain injury ([place])   . Intracranial injury [facility])    Past Surgical History  Procedure Laterality  Date  . Appendectomy    . Tonsillectomy    . Hernia repair      No Known Allergies    Medication List       This list is accurate as of: 08/08/15  2:11 PM.  Always use your most recent med list.               acetaminophen 325 MG tablet  Commonly known as:  TYLENOL  Take 650 mg by mouth every 6 (six) hours as needed.     amLODipine 5 MG tablet  Commonly known as:  NORVASC  Take 7.5 mg by mouth daily.     aspirin EC 81 MG tablet  Take 81 mg by mouth daily.     azelastine 0.1 % nasal spray  Commonly known as:  ASTELIN  Place 2 sprays into both nostrils at bedtime. Use in each nostril as directed     bimatoprost 0.01 % Soln  Commonly known as:  LUMIGAN  Place 1 drop into the left eye at bedtime.     brimonidine 0.2 % ophthalmic solution  Commonly known as:  ALPHAGAN  Place 1 drop into the left eye 2 (two) times daily.     carvedilol 12.5 MG tablet  Commonly known as:  COREG  Take 12.5 mg by mouth 2 (two) times daily with a meal.     cephALEXin 500 MG capsule  Commonly known as:  KEFLEX  Take 1 capsule (500 mg total) by mouth 4 (four) times daily.     citalopram 20 MG tablet  Commonly known as:  CELEXA  Take 20 mg by mouth daily.     coal tar 0.5 % shampoo  Commonly known as:  NEUTROGENA T-GEL  Apply topically once a week.     donepezil 10 MG tablet  Commonly known as:  ARICEPT  Take 10 mg by mouth at bedtime.     dorzolamide-timolol 22.3-6.8 MG/ML ophthalmic solution  Commonly known as:  COSOPT  Place 1 drop into both eyes 2 (two) times daily.     loperamide 2 MG capsule  Commonly known as:  IMODIUM  Take 2-4 mg by mouth as needed for diarrhea or loose stools.     mometasone 50 MCG/ACT nasal spray  Commonly known as:  NASONEX  Place 2 sprays into the nose daily.     potassium chloride 10 MEQ CR capsule  Commonly known as:  MICRO-K  Take 10 mEq by mouth daily.     tamsulosin 0.4 MG Caps capsule  Commonly known as:  FLOMAX  Take 0.4 mg by mouth  daily.     torsemide 5 MG tablet  Commonly known as:  DEMADEX  Take 5 mg by mouth daily.     traMADol 50 MG tablet  Commonly known as:  ULTRAM  Take 50 mg by mouth every 4 (four) hours as needed.     traZODone 50 MG tablet  Commonly known as:  DESYREL  Take 50 mg by mouth at bedtime as needed for sleep.        Review of Systems  Unable to perform ROS: Dementia  Constitutional: Negative.   Respiratory: Negative.   Cardiovascular: Negative.   Gastrointestinal: Negative.   Genitourinary: Positive for urgency, hematuria and penile pain.  Musculoskeletal: Negative.   Skin: Negative.   All other systems reviewed and are negative.    There is no immunization history on file for this patient. There are no preventive care reminders to display for this patient. No flowsheet data found. Functional Status Survey:    There were no vitals filed for this visit. There is no weight on file to calculate BMI. Physical Exam  Constitutional: Vital signs are normal. He appears well-developed and well-nourished. No distress.  Cardiovascular: Normal rate, regular rhythm, normal heart sounds and normal pulses.  Exam reveals no gallop and no friction rub.   No murmur heard. Pulmonary/Chest: Effort normal and breath sounds normal. No respiratory distress.  Abdominal: Normal appearance and bowel sounds are normal. There is no tenderness.  Genitourinary: Circumcised. Penile tenderness present. No penile erythema. No discharge found.  Neurological: He is alert.  Skin: Skin is warm, dry and intact.  Nursing note and vitals reviewed.   Labs reviewed:  Recent Labs  12/10/14 0540 08/05/15 2035  NA 139 140  K 3.8 3.8  CL 109 108  CO2 23 24  GLUCOSE 97 106*  BUN 28* 28*  CREATININE 1.00 1.28*  CALCIUM 8.3* 8.8*    Recent Labs  08/05/15 2035  AST 19  ALT 20  ALKPHOS 82  BILITOT 0.4  PROT 6.9  ALBUMIN 3.9    Recent Labs  08/05/15 2035  WBC 9.4  NEUTROABS 6.5  HGB 12.2*    HCT 36.0*  MCV 89.8  PLT 254   No results found for: TSH No results found for: HGBA1C No results found for: CHOL, HDL, LDLCALC, LDLDIRECT, TRIG, CHOLHDL  Significant Diagnostic  Results in last 30 days:  Ct Renal Stone Study  08/05/2015  CLINICAL DATA:  Hematuria. EXAM: CT ABDOMEN AND PELVIS WITHOUT CONTRAST TECHNIQUE: Multidetector CT imaging of the abdomen and pelvis was performed following the standard protocol without IV contrast. COMPARISON:  None. FINDINGS: Atelectasis seen in the lingula. Coronary artery calcifications are noted. Lung bases are otherwise unremarkable. No free air or free fluid. No renal stones or hydronephrosis. A tiny exophytic nodule off the left kidney is too small to characterize but may simply represent a hyperdense cyst. This exophytic nodule measures 8 mm on series 2, image 39. No other masses. No ureteral stones or ureterectasis. The patient is status post cholecystectomy. The liver, spleen, adrenal glands, and pancreas are unremarkable. Atherosclerotic changes seen in the non aneurysmal aorta. No adenopathy. The stomach and small bowel are normal. The colon demonstrates diverticulosis without diverticulitis. The patient is status post appendectomy. High attenuation material centered in the right side of the bladder is consistent with blood products. An underlying source is not definitely seen. There is mild increased attenuation in the fat around the bladder however. No adenopathy or mass. No other abnormalities seen in the pelvis. The visualized bones demonstrate degenerative changes. IMPRESSION: 1. High attenuation centered in the right side of the bladder consistent with blood products. Mild increased attenuation of fat around the bladder raises the possibility of an inflammatory or infectious etiology. No stones or suspicious masses are seen. The small exophytic nodule off the left kidney is too small to characterize but may simply be a hyperdense cyst. 2.  Atherosclerotic change in the abdominal aorta. Electronically Signed   By: Dorise Bullion III M.D   On: 08/05/2015 21:59    Assessment/Plan 1. Penis pain  Tramadol 50 mg 1-2 tablets Q 4 hours prn pain #120  Pyridium 100 mg po TID x 3 days  Lidocaine cream 4%- apply small amount to tip of penis QID prn for pain. May also retract penis and apply cream to foley tubing to allow med to reach urethra  2. Urinary tract infection with hematuria, site unspecified  >100,000 E-Coli on cx  Cipro 250 mg po Q 12 hours x 14 days  DC Keflex (Rx from ED)  Maintain foley  Urology f/u next week  Flush foley prn for clots.  No indication of sepsis  Family/ staff Communication:   Total Time: 40 minutes  Documentation: 20 minutes  Face to Face: 20 minutes  Family/Phone:   Labs/tests ordered:  na  Vikki Ports, NP-C [facility] [address], [place] [address] Cell Phone (Mon-Fri 8am-5pm):  [phone] On Call:  [phone] & follow prompts after 5pm & weekends Office Phone:  [phone] Office Fax:  [phone]

## 2015-08-09 ENCOUNTER — Encounter
Admission: RE | Admit: 2015-08-09 | Discharge: 2015-08-09 | Disposition: A | Discharge status: Home / Self Care | Payer: Medicare Other | Source: Ambulatory Visit | Attending: Internal Medicine | Admitting: Internal Medicine

## 2015-08-09 DIAGNOSIS — R39198 Other difficulties with micturition: Secondary | ICD-10-CM | POA: Insufficient documentation

## 2015-08-14 DIAGNOSIS — N3941 Urge incontinence: Secondary | ICD-10-CM | POA: Diagnosis not present

## 2015-08-14 DIAGNOSIS — N138 Other obstructive and reflux uropathy: Secondary | ICD-10-CM | POA: Diagnosis not present

## 2015-08-14 DIAGNOSIS — R339 Retention of urine, unspecified: Secondary | ICD-10-CM | POA: Diagnosis not present

## 2015-08-14 DIAGNOSIS — R350 Frequency of micturition: Secondary | ICD-10-CM | POA: Diagnosis not present

## 2015-08-14 DIAGNOSIS — N401 Enlarged prostate with lower urinary tract symptoms: Secondary | ICD-10-CM | POA: Diagnosis not present

## 2015-08-14 DIAGNOSIS — N3001 Acute cystitis with hematuria: Secondary | ICD-10-CM | POA: Diagnosis not present

## 2015-09-04 DIAGNOSIS — R39198 Other difficulties with micturition: Secondary | ICD-10-CM | POA: Diagnosis not present

## 2015-09-04 LAB — URINALYSIS COMPLETE WITH MICROSCOPIC (ARMC ONLY)
BACTERIA UA: NONE SEEN
BILIRUBIN URINE: NEGATIVE
GLUCOSE, UA: NEGATIVE mg/dL
KETONES UR: NEGATIVE mg/dL
LEUKOCYTES UA: NEGATIVE
Nitrite: NEGATIVE
PH: 6 (ref 5.0–8.0)
Protein, ur: NEGATIVE mg/dL
RBC / HPF: NONE SEEN RBC/hpf (ref 0–5)
Specific Gravity, Urine: 1.017 (ref 1.005–1.030)

## 2015-09-05 LAB — URINE CULTURE

## 2015-09-09 ENCOUNTER — Encounter
Admission: RE | Admit: 2015-09-09 | Discharge: 2015-09-09 | Disposition: A | Discharge status: Home / Self Care | Payer: Medicare Other | Source: Ambulatory Visit | Attending: Internal Medicine | Admitting: Internal Medicine

## 2015-09-15 DIAGNOSIS — F329 Major depressive disorder, single episode, unspecified: Secondary | ICD-10-CM | POA: Diagnosis not present

## 2015-09-15 DIAGNOSIS — F0390 Unspecified dementia without behavioral disturbance: Secondary | ICD-10-CM | POA: Diagnosis not present

## 2015-09-15 DIAGNOSIS — I509 Heart failure, unspecified: Secondary | ICD-10-CM | POA: Diagnosis not present

## 2015-10-10 ENCOUNTER — Encounter: Admission: RE | Admit: 2015-10-10 | Payer: Medicare Other | Source: Ambulatory Visit | Admitting: Internal Medicine

## 2015-11-09 ENCOUNTER — Encounter
Admission: RE | Admit: 2015-11-09 | Discharge: 2015-11-09 | Disposition: A | Discharge status: Home / Self Care | Payer: Medicare Other | Source: Ambulatory Visit | Attending: Internal Medicine | Admitting: Internal Medicine

## 2015-11-19 DIAGNOSIS — F329 Major depressive disorder, single episode, unspecified: Secondary | ICD-10-CM | POA: Diagnosis not present

## 2015-11-19 DIAGNOSIS — G309 Alzheimer's disease, unspecified: Secondary | ICD-10-CM | POA: Diagnosis not present

## 2015-11-19 DIAGNOSIS — G4700 Insomnia, unspecified: Secondary | ICD-10-CM | POA: Diagnosis not present

## 2015-11-19 DIAGNOSIS — F0280 Dementia in other diseases classified elsewhere without behavioral disturbance: Secondary | ICD-10-CM | POA: Diagnosis not present

## 2015-12-10 ENCOUNTER — Encounter
Admission: RE | Admit: 2015-12-10 | Discharge: 2015-12-10 | Disposition: A | Discharge status: Home / Self Care | Payer: Medicare Other | Source: Ambulatory Visit | Attending: Internal Medicine | Admitting: Internal Medicine

## 2015-12-10 DIAGNOSIS — R410 Disorientation, unspecified: Secondary | ICD-10-CM | POA: Insufficient documentation

## 2015-12-16 DIAGNOSIS — H401133 Primary open-angle glaucoma, bilateral, severe stage: Secondary | ICD-10-CM | POA: Diagnosis not present

## 2015-12-24 DIAGNOSIS — F329 Major depressive disorder, single episode, unspecified: Secondary | ICD-10-CM | POA: Diagnosis not present

## 2015-12-24 DIAGNOSIS — I10 Essential (primary) hypertension: Secondary | ICD-10-CM | POA: Diagnosis not present

## 2015-12-24 DIAGNOSIS — G4733 Obstructive sleep apnea (adult) (pediatric): Secondary | ICD-10-CM | POA: Diagnosis not present

## 2015-12-24 DIAGNOSIS — I509 Heart failure, unspecified: Secondary | ICD-10-CM | POA: Diagnosis not present

## 2015-12-29 ENCOUNTER — Non-Acute Institutional Stay: Payer: Medicare Other | Admitting: Gerontology

## 2015-12-29 DIAGNOSIS — J069 Acute upper respiratory infection, unspecified: Secondary | ICD-10-CM | POA: Diagnosis not present

## 2015-12-29 DIAGNOSIS — R0989 Other specified symptoms and signs involving the circulatory and respiratory systems: Secondary | ICD-10-CM | POA: Diagnosis not present

## 2015-12-29 DIAGNOSIS — R062 Wheezing: Secondary | ICD-10-CM | POA: Diagnosis not present

## 2015-12-30 DIAGNOSIS — R410 Disorientation, unspecified: Secondary | ICD-10-CM | POA: Diagnosis not present

## 2015-12-30 LAB — URINALYSIS COMPLETE WITH MICROSCOPIC (ARMC ONLY)
BILIRUBIN URINE: NEGATIVE
Bacteria, UA: NONE SEEN
GLUCOSE, UA: NEGATIVE mg/dL
HGB URINE DIPSTICK: NEGATIVE
Ketones, ur: NEGATIVE mg/dL
LEUKOCYTES UA: NEGATIVE
Nitrite: NEGATIVE
Protein, ur: NEGATIVE mg/dL
RBC / HPF: NONE SEEN RBC/hpf (ref 0–5)
SPECIFIC GRAVITY, URINE: 1.013 (ref 1.005–1.030)
pH: 5 (ref 5.0–8.0)

## 2015-12-31 ENCOUNTER — Non-Acute Institutional Stay: Payer: Medicare Other | Admitting: Gerontology

## 2015-12-31 DIAGNOSIS — J069 Acute upper respiratory infection, unspecified: Secondary | ICD-10-CM

## 2016-01-01 LAB — URINE CULTURE: CULTURE: NO GROWTH

## 2016-01-06 NOTE — Progress Notes (Signed)
Location:      Place of Service:  ALF (13) Provider:  Toni Arthurs, NP-C  Kirk Ruths., MD  Patient Care Team: Kirk Ruths, MD as PCP - General (Internal Medicine)  Extended Emergency Contact Information Primary Emergency Contact: [facility] Address: [address] [place] [address] Tony [name] of [place] Phone: [phone] Work Phone: [phone] Mobile Phone: [phone] Relation: Son Secondary Emergency Contact: Nelia Shi Address: [address]          [place], [place] [address] Tony [name] of [place] Phone: [phone] Work Phone: [phone] Mobile Phone: [phone] Relation: Daughter  Code Status:  dnr Goals of care: Advanced Directive information Advanced Directives 08/05/2015  Does Patient Have a Medical Advance Directive? Yes  Does patient want to make changes to medical advance directive? No - Patient declined  Copy of [place] in Chart? No - copy requested     Chief Complaint  Patient presents with  . Acute Visit    HPI:  Pt is a 82 y.o. male seen today for an acute visit for cough, congestion, wheezing. Staff is concerned about on-going wheezing. Daughter asked for pt to be evaluated for safety (medically) to take pt out of the facility for Thanksgiving. Pt denies cough, congestion. However, pt has dementia and is not a reliable historian. Pt reports feeling well and is without complaints. Afebrile. VSS remain stable.    Past Medical History:  Diagnosis Date  . Anemia   . Benign prostatic hyperplasia   . Dementia   . Depression   . Dysphagia   . Falling   . Glaucoma   . Hypertension   . Intracranial injury ([place])   . Osteoarthritis   . Sleep apnea   . Spondylosis of cervical joint   . Traumatic brain injury [facility])    Past Surgical History:  Procedure Laterality Date  . APPENDECTOMY    . HERNIA REPAIR    . TONSILLECTOMY      No Known Allergies    Medication List         Accurate as of 12/29/15 11:59 PM. Always use your most recent med list.          acetaminophen 325 MG tablet Commonly known as:  TYLENOL Take 650 mg by mouth every 6 (six) hours as needed.   amLODipine 5 MG tablet Commonly known as:  NORVASC Take 7.5 mg by mouth daily.   aspirin EC 81 MG tablet Take 81 mg by mouth daily.   azelastine 0.1 % nasal spray Commonly known as:  ASTELIN Place 2 sprays into both nostrils at bedtime. Use in each nostril as directed   bimatoprost 0.01 % Soln Commonly known as:  LUMIGAN Place 1 drop into the left eye at bedtime.   brimonidine 0.2 % ophthalmic solution Commonly known as:  ALPHAGAN Place 1 drop into the left eye 2 (two) times daily.   carvedilol 12.5 MG tablet Commonly known as:  COREG Take 12.5 mg by mouth 2 (two) times daily with a meal.   citalopram 20 MG tablet Commonly known as:  CELEXA Take 20 mg by mouth daily.   coal tar 0.5 % shampoo Commonly known as:  NEUTROGENA T-GEL Apply topically once a week.   donepezil 10 MG tablet Commonly known as:  ARICEPT Take 10 mg by mouth at bedtime.   dorzolamide-timolol 22.3-6.8 MG/ML ophthalmic solution Commonly known as:  COSOPT Place 1 drop into both eyes 2 (  two) times daily.   loperamide 2 MG capsule Commonly known as:  IMODIUM Take 2-4 mg by mouth as needed for diarrhea or loose stools.   mometasone 50 MCG/ACT nasal spray Commonly known as:  NASONEX Place 2 sprays into the nose daily.   potassium chloride 10 MEQ CR capsule Commonly known as:  MICRO-K Take 10 mEq by mouth daily.   tamsulosin 0.4 MG Caps capsule Commonly known as:  FLOMAX Take 0.4 mg by mouth daily.   torsemide 5 MG tablet Commonly known as:  DEMADEX Take 5 mg by mouth daily.   traMADol 50 MG tablet Commonly known as:  ULTRAM Take 50 mg by mouth every 4 (four) hours as needed.   traZODone 50 MG tablet Commonly known as:  DESYREL Take 50 mg by mouth at bedtime as needed for sleep.        Review of Systems  Unable to perform ROS: Dementia  Constitutional: Negative for activity change, appetite change, chills, diaphoresis and fever.  HENT: Negative for congestion, sneezing, sore throat, trouble swallowing and voice change.   Respiratory: Positive for cough. Negative for apnea, choking, chest tightness, shortness of breath and wheezing.   Cardiovascular: Negative for chest pain.  Musculoskeletal: Arthralgias: typical arthritis.  Neurological: Negative for dizziness, tremors, syncope, speech difficulty, weakness, numbness and headaches.  All other systems reviewed and are negative.    There is no immunization history on file for this patient. Pertinent  Health Maintenance Due  Topic Date Due  . PNA vac Low Risk Adult (1 of 2 - PCV13) 02/23/1992  . INFLUENZA VACCINE  09/09/2015   No flowsheet data found. Functional Status Survey:    Vitals:   12/29/15 0600 01/06/16 2251  BP: (!) 128/53 (!) 128/53   There is no height or weight on file to calculate BMI. Physical Exam  Constitutional: He is oriented to person, place, and time. Vital signs are normal. He appears well-developed and well-nourished. He is active and cooperative. He does not appear ill. No distress.  HENT:  Head: Normocephalic and atraumatic.  Mouth/Throat: Uvula is midline, oropharynx is clear and moist and mucous membranes are normal. Mucous membranes are not pale, not dry and not cyanotic.  Eyes: Conjunctivae, EOM and lids are normal. Pupils are equal, round, and reactive to light.  Neck: Trachea normal, normal range of motion and full passive range of motion without pain. Neck supple. No JVD present. No tracheal deviation, no edema and no erythema present. No thyromegaly present.  Cardiovascular: Normal rate, regular rhythm, normal heart sounds, intact distal pulses and normal pulses.  Exam reveals no gallop, no distant heart sounds and no friction rub.   No murmur heard. Pulmonary/Chest: Effort  normal. No accessory muscle usage. No respiratory distress. He has no decreased breath sounds. He has no wheezes. He has no rhonchi. He has rales (faint) in the right lower field and the left lower field. He exhibits no tenderness.  Abdominal: Soft. Normal appearance and bowel sounds are normal. He exhibits no distension and no ascites. There is no tenderness.  Musculoskeletal: Normal range of motion. He exhibits no edema or tenderness.  Expected osteoarthritis, stiffness  Neurological: He is alert and oriented to person, place, and time. He has normal strength.  Skin: Skin is warm, dry and intact. He is not diaphoretic. No cyanosis. No pallor. Nails show no clubbing.  Psychiatric: He has a normal mood and affect. His speech is normal and behavior is normal. Judgment and thought content normal. Cognition and  memory are normal.  Nursing note and vitals reviewed.   Labs reviewed:  Recent Labs  08/05/15 2035  NA 140  K 3.8  CL 108  CO2 24  GLUCOSE 106*  BUN 28*  CREATININE 1.28*  CALCIUM 8.8*    Recent Labs  08/05/15 2035  AST 19  ALT 20  ALKPHOS 82  BILITOT 0.4  PROT 6.9  ALBUMIN 3.9    Recent Labs  08/05/15 2035  WBC 9.4  NEUTROABS 6.5  HGB 12.2*  HCT 36.0*  MCV 89.8  PLT 254   No results found for: TSH No results found for: HGBA1C No results found for: CHOL, HDL, LDLCALC, LDLDIRECT, TRIG, CHOLHDL  Significant Diagnostic Results in last 30 days:  No results found.  Assessment/Plan 1. Acute upper respiratory infection  Azithromycin 250 mg- 2 tablets today, then 1 tablet daily x 4 days  Tessalon Perles 100 mg- 1 capsule po TID scheduled x 3 days  Furosemide 20 mg IM x 1 now  Continue Guaifenesin 10 mL po Q 4 hours prn- cough/ congestion   Family/ staff Communication:   Total Time:  Documentation:  Face to Face:  Family/Phone:   Labs/tests ordered:  cxr  Medication list reviewed and assessed for continued appropriateness.  Vikki Ports,  NP-C [facility] [phone], [place] [address] Cell Phone (Mon-Fri 8am-5pm):  [phone] On Call:  [phone] & follow prompts after 5pm & weekends Office Phone:  [phone] Office Fax:  [phone]

## 2016-01-08 NOTE — Progress Notes (Signed)
Location:      Place of Service:  ALF (13) Provider:  Toni Arthurs, NP-C  Kirk Ruths., MD  Patient Care Team: Kirk Ruths, MD as PCP - General (Internal Medicine)  Extended Emergency Contact Information Primary Emergency Contact: [facility] Address: [address] [place] [address] Tony [name] of [place] Phone: [phone] Work Phone: [phone] Mobile Phone: [phone] Relation: Son Secondary Emergency Contact: Nelia Shi Address: [address]          [place], [place] [address] Tony [name] of [place] Phone: [phone] Work Phone: [phone] Mobile Phone: [phone] Relation: Daughter  Code Status:  dnr Goals of care: Advanced Directive information Advanced Directives 08/05/2015  Does Patient Have a Medical Advance Directive? Yes  Does patient want to make changes to medical advance directive? No - Patient declined  Copy of [place] in Chart? No - copy requested     Chief Complaint  Patient presents with  . Follow-up    HPI:  Pt is a 82 y.o. male seen today for an follow up visit for cough, congestion, wheezing. Staff was concerned about on-going wheezing. Daughter asked for pt to be evaluated for safety (medically) to take pt out of the facility for Thanksgiving. Pt denies cough, congestion. However, pt has dementia and is not a reliable historian. CXR was obtained. Abt and cough suppressant initiated.Pt reports feeling well and is without complaints. Afebrile. VSS remain stable.    Past Medical History:  Diagnosis Date  . Anemia   . Benign prostatic hyperplasia   . Dementia   . Depression   . Dysphagia   . Falling   . Glaucoma   . Hypertension   . Intracranial injury ([place])   . Osteoarthritis   . Sleep apnea   . Spondylosis of cervical joint   . Traumatic brain injury [facility])    Past Surgical History:  Procedure Laterality Date  . APPENDECTOMY    . HERNIA REPAIR    .  TONSILLECTOMY      No Known Allergies    Medication List       Accurate as of 12/31/15 11:59 PM. Always use your most recent med list.          acetaminophen 325 MG tablet Commonly known as:  TYLENOL Take 650 mg by mouth every 6 (six) hours as needed.   amLODipine 5 MG tablet Commonly known as:  NORVASC Take 7.5 mg by mouth daily.   aspirin EC 81 MG tablet Take 81 mg by mouth daily.   azelastine 0.1 % nasal spray Commonly known as:  ASTELIN Place 2 sprays into both nostrils at bedtime. Use in each nostril as directed   bimatoprost 0.01 % Soln Commonly known as:  LUMIGAN Place 1 drop into the left eye at bedtime.   brimonidine 0.2 % ophthalmic solution Commonly known as:  ALPHAGAN Place 1 drop into the left eye 2 (two) times daily.   carvedilol 12.5 MG tablet Commonly known as:  COREG Take 12.5 mg by mouth 2 (two) times daily with a meal.   citalopram 20 MG tablet Commonly known as:  CELEXA Take 20 mg by mouth daily.   coal tar 0.5 % shampoo Commonly known as:  NEUTROGENA T-GEL Apply topically once a week.   donepezil 10 MG tablet Commonly known as:  ARICEPT Take 10 mg by mouth at bedtime.   dorzolamide-timolol 22.3-6.8 MG/ML ophthalmic solution Commonly known as:  COSOPT Place  1 drop into both eyes 2 (two) times daily.   loperamide 2 MG capsule Commonly known as:  IMODIUM Take 2-4 mg by mouth as needed for diarrhea or loose stools.   mometasone 50 MCG/ACT nasal spray Commonly known as:  NASONEX Place 2 sprays into the nose daily.   potassium chloride 10 MEQ CR capsule Commonly known as:  MICRO-K Take 10 mEq by mouth daily.   tamsulosin 0.4 MG Caps capsule Commonly known as:  FLOMAX Take 0.4 mg by mouth daily.   torsemide 5 MG tablet Commonly known as:  DEMADEX Take 5 mg by mouth daily.   traMADol 50 MG tablet Commonly known as:  ULTRAM Take 50 mg by mouth every 4 (four) hours as needed.   traZODone 50 MG tablet Commonly known as:   DESYREL Take 50 mg by mouth at bedtime as needed for sleep.       Review of Systems  Unable to perform ROS: Dementia  Constitutional: Negative for activity change, appetite change, chills, diaphoresis and fever.  HENT: Negative for congestion, sneezing, sore throat, trouble swallowing and voice change.   Respiratory: Positive for cough. Negative for apnea, choking, chest tightness, shortness of breath and wheezing.   Cardiovascular: Negative for chest pain.  Musculoskeletal: Arthralgias: typical arthritis.  Neurological: Negative for dizziness, tremors, syncope, speech difficulty, weakness, numbness and headaches.  All other systems reviewed and are negative.    There is no immunization history on file for this patient. Pertinent  Health Maintenance Due  Topic Date Due  . PNA vac Low Risk Adult (1 of 2 - PCV13) 02/23/1992  . INFLUENZA VACCINE  09/09/2015   No flowsheet data found. Functional Status Survey:    There were no vitals filed for this visit. There is no height or weight on file to calculate BMI. Physical Exam  Constitutional: He is oriented to person, place, and time. Vital signs are normal. He appears well-developed and well-nourished. He is active and cooperative. He does not appear ill. No distress.  HENT:  Head: Normocephalic and atraumatic.  Mouth/Throat: Uvula is midline, oropharynx is clear and moist and mucous membranes are normal. Mucous membranes are not pale, not dry and not cyanotic.  Eyes: Conjunctivae, EOM and lids are normal. Pupils are equal, round, and reactive to light.  Neck: Trachea normal, normal range of motion and full passive range of motion without pain. Neck supple. No JVD present. No tracheal deviation, no edema and no erythema present. No thyromegaly present.  Cardiovascular: Normal rate, regular rhythm, normal heart sounds, intact distal pulses and normal pulses.  Exam reveals no gallop, no distant heart sounds and no friction rub.   No  murmur heard. Pulmonary/Chest: Effort normal. No accessory muscle usage. No respiratory distress. He has no decreased breath sounds. He has no wheezes. He has no rhonchi. He has rales (faint) in the right lower field and the left lower field. He exhibits no tenderness.  Abdominal: Soft. Normal appearance and bowel sounds are normal. He exhibits no distension and no ascites. There is no tenderness.  Musculoskeletal: Normal range of motion. He exhibits no edema or tenderness.  Expected osteoarthritis, stiffness  Neurological: He is alert and oriented to person, place, and time. He has normal strength.  Skin: Skin is warm, dry and intact. He is not diaphoretic. No cyanosis. No pallor. Nails show no clubbing.  Psychiatric: He has a normal mood and affect. His speech is normal and behavior is normal. Judgment and thought content normal. Cognition and memory  are normal.  Nursing note and vitals reviewed.   Labs reviewed:  Recent Labs  08/05/15 2035  NA 140  K 3.8  CL 108  CO2 24  GLUCOSE 106*  BUN 28*  CREATININE 1.28*  CALCIUM 8.8*    Recent Labs  08/05/15 2035  AST 19  ALT 20  ALKPHOS 82  BILITOT 0.4  PROT 6.9  ALBUMIN 3.9    Recent Labs  08/05/15 2035  WBC 9.4  NEUTROABS 6.5  HGB 12.2*  HCT 36.0*  MCV 89.8  PLT 254   No results found for: TSH No results found for: HGBA1C No results found for: CHOL, HDL, LDLCALC, LDLDIRECT, TRIG, CHOLHDL  Significant Diagnostic Results in last 30 days:  No results found.  Assessment/Plan 1. Acute upper respiratory infection  Finish course of Azithromycin 250 mg  Tessalon Perles 100 mg- 1 capsule po TID scheduled x 3 days  Continue Guaifenesin 10 mL po Q 4 hours prn- cough/ congestion   Ok for pt to go out of facility for Thanksgiving with family  Family/ staff Communication:   Total Time:  Documentation:  Face to Face:  Family/Phone:   Labs/tests ordered:    Medication list reviewed and assessed for continued  appropriateness.  Vikki Ports, NP-C [facility] [phone], [place] [address] Cell Phone (Mon-Fri 8am-5pm):  [phone] On Call:  [phone] & follow prompts after 5pm & weekends Office Phone:  [phone] Office Fax:  [phone]

## 2016-01-09 ENCOUNTER — Encounter
Admission: RE | Admit: 2016-01-09 | Discharge: 2016-01-09 | Disposition: A | Discharge status: Home / Self Care | Payer: Medicare Other | Source: Ambulatory Visit | Attending: Internal Medicine | Admitting: Internal Medicine

## 2016-01-09 DIAGNOSIS — R410 Disorientation, unspecified: Secondary | ICD-10-CM | POA: Insufficient documentation

## 2016-01-21 DIAGNOSIS — G309 Alzheimer's disease, unspecified: Secondary | ICD-10-CM | POA: Diagnosis not present

## 2016-01-21 DIAGNOSIS — F0280 Dementia in other diseases classified elsewhere without behavioral disturbance: Secondary | ICD-10-CM | POA: Diagnosis not present

## 2016-01-21 DIAGNOSIS — G4700 Insomnia, unspecified: Secondary | ICD-10-CM | POA: Diagnosis not present

## 2016-01-21 DIAGNOSIS — F329 Major depressive disorder, single episode, unspecified: Secondary | ICD-10-CM | POA: Diagnosis not present

## 2016-01-29 DIAGNOSIS — R262 Difficulty in walking, not elsewhere classified: Secondary | ICD-10-CM | POA: Diagnosis not present

## 2016-01-29 DIAGNOSIS — R410 Disorientation, unspecified: Secondary | ICD-10-CM | POA: Diagnosis not present

## 2016-01-29 DIAGNOSIS — M6281 Muscle weakness (generalized): Secondary | ICD-10-CM | POA: Diagnosis not present

## 2016-01-29 LAB — COMPREHENSIVE METABOLIC PANEL
ALBUMIN: 4 g/dL (ref 3.5–5.0)
ALK PHOS: 68 U/L (ref 38–126)
ALT: 27 U/L (ref 17–63)
ANION GAP: 7 (ref 5–15)
AST: 25 U/L (ref 15–41)
BILIRUBIN TOTAL: 0.3 mg/dL (ref 0.3–1.2)
BUN: 21 mg/dL — AB (ref 6–20)
CALCIUM: 9.1 mg/dL (ref 8.9–10.3)
CO2: 22 mmol/L (ref 22–32)
Chloride: 112 mmol/L — ABNORMAL HIGH (ref 101–111)
Creatinine, Ser: 1.06 mg/dL (ref 0.61–1.24)
GFR calc Af Amer: >60 mL/min (ref >60)
GFR calc non Af Amer: >60 mL/min (ref >60)
GLUCOSE: 96 mg/dL (ref 65–99)
Potassium: 3.8 mmol/L (ref 3.5–5.1)
SODIUM: 141 mmol/L (ref 135–145)
Total Protein: 7.5 g/dL (ref 6.5–8.1)

## 2016-01-29 LAB — CBC WITH DIFFERENTIAL/PLATELET
BASOS ABS: 0 10*3/uL (ref 0–0.1)
Basophils Relative: 0 %
Eosinophils Absolute: 0.2 10*3/uL (ref 0–0.7)
Eosinophils Relative: 2 %
HEMATOCRIT: 41.2 % (ref 40.0–52.0)
HEMOGLOBIN: 13.8 g/dL (ref 13.0–18.0)
LYMPHS ABS: 1.7 10*3/uL (ref 1.0–3.6)
LYMPHS PCT: 22 %
MCH: 30.2 pg (ref 26.0–34.0)
MCHC: 33.4 g/dL (ref 32.0–36.0)
MCV: 90.5 fL (ref 80.0–100.0)
Monocytes Absolute: 0.5 10*3/uL (ref 0.2–1.0)
Monocytes Relative: 7 %
NEUTROS ABS: 5.4 10*3/uL (ref 1.4–6.5)
NEUTROS PCT: 69 %
PLATELETS: 237 10*3/uL (ref 150–440)
RBC: 4.55 MIL/uL (ref 4.40–5.90)
RDW: 14.6 % — ABNORMAL HIGH (ref 11.5–14.5)
WBC: 7.8 10*3/uL (ref 3.8–10.6)

## 2016-01-29 LAB — MAGNESIUM: Magnesium: 2.5 mg/dL — ABNORMAL HIGH (ref 1.7–2.4)

## 2016-01-29 LAB — FOLATE: Folate: 10.2 ng/mL (ref >5.9)

## 2016-01-29 LAB — TSH: TSH: 1.535 u[IU]/mL (ref 0.350–4.500)

## 2016-01-30 LAB — VITAMIN D 25 HYDROXY (VIT D DEFICIENCY, FRACTURES): Vit D, 25-Hydroxy: 12.4 ng/mL — ABNORMAL LOW (ref 30.0–100.0)

## 2016-01-30 LAB — VITAMIN B12: Vitamin B-12: 198 pg/mL (ref 180–914)

## 2016-02-03 DIAGNOSIS — R262 Difficulty in walking, not elsewhere classified: Secondary | ICD-10-CM | POA: Diagnosis not present

## 2016-02-03 DIAGNOSIS — M6281 Muscle weakness (generalized): Secondary | ICD-10-CM | POA: Diagnosis not present

## 2016-02-07 DIAGNOSIS — M6281 Muscle weakness (generalized): Secondary | ICD-10-CM | POA: Diagnosis not present

## 2016-02-07 DIAGNOSIS — R262 Difficulty in walking, not elsewhere classified: Secondary | ICD-10-CM | POA: Diagnosis not present

## 2016-02-09 ENCOUNTER — Encounter
Admission: RE | Admit: 2016-02-09 | Discharge: 2016-02-09 | Disposition: A | Discharge status: Home / Self Care | Payer: Medicare Other | Source: Ambulatory Visit | Attending: Internal Medicine | Admitting: Internal Medicine

## 2016-02-10 DIAGNOSIS — M6281 Muscle weakness (generalized): Secondary | ICD-10-CM | POA: Diagnosis not present

## 2016-02-10 DIAGNOSIS — R262 Difficulty in walking, not elsewhere classified: Secondary | ICD-10-CM | POA: Diagnosis not present

## 2016-02-11 DIAGNOSIS — R262 Difficulty in walking, not elsewhere classified: Secondary | ICD-10-CM | POA: Diagnosis not present

## 2016-02-11 DIAGNOSIS — M6281 Muscle weakness (generalized): Secondary | ICD-10-CM | POA: Diagnosis not present

## 2016-02-13 DIAGNOSIS — M6281 Muscle weakness (generalized): Secondary | ICD-10-CM | POA: Diagnosis not present

## 2016-02-13 DIAGNOSIS — R262 Difficulty in walking, not elsewhere classified: Secondary | ICD-10-CM | POA: Diagnosis not present

## 2016-02-16 DIAGNOSIS — M6281 Muscle weakness (generalized): Secondary | ICD-10-CM | POA: Diagnosis not present

## 2016-02-16 DIAGNOSIS — R262 Difficulty in walking, not elsewhere classified: Secondary | ICD-10-CM | POA: Diagnosis not present

## 2016-03-02 DIAGNOSIS — N3941 Urge incontinence: Secondary | ICD-10-CM | POA: Diagnosis not present

## 2016-03-02 DIAGNOSIS — R339 Retention of urine, unspecified: Secondary | ICD-10-CM | POA: Diagnosis not present

## 2016-03-02 DIAGNOSIS — R310 Gross hematuria: Secondary | ICD-10-CM | POA: Diagnosis not present

## 2016-03-02 DIAGNOSIS — N401 Enlarged prostate with lower urinary tract symptoms: Secondary | ICD-10-CM | POA: Diagnosis not present

## 2016-03-02 DIAGNOSIS — R350 Frequency of micturition: Secondary | ICD-10-CM | POA: Diagnosis not present

## 2016-03-02 DIAGNOSIS — N138 Other obstructive and reflux uropathy: Secondary | ICD-10-CM | POA: Diagnosis not present

## 2016-03-11 ENCOUNTER — Encounter
Admission: RE | Admit: 2016-03-11 | Discharge: 2016-03-11 | Disposition: A | Discharge status: Home / Self Care | Payer: Medicare Other | Source: Ambulatory Visit | Attending: Internal Medicine | Admitting: Internal Medicine

## 2016-03-22 DIAGNOSIS — I10 Essential (primary) hypertension: Secondary | ICD-10-CM | POA: Diagnosis not present

## 2016-03-22 DIAGNOSIS — I5032 Chronic diastolic (congestive) heart failure: Secondary | ICD-10-CM | POA: Diagnosis not present

## 2016-03-22 DIAGNOSIS — F325 Major depressive disorder, single episode, in full remission: Secondary | ICD-10-CM | POA: Diagnosis not present

## 2016-03-22 DIAGNOSIS — F0280 Dementia in other diseases classified elsewhere without behavioral disturbance: Secondary | ICD-10-CM | POA: Diagnosis not present

## 2016-03-22 DIAGNOSIS — G301 Alzheimer's disease with late onset: Secondary | ICD-10-CM | POA: Diagnosis not present

## 2016-03-30 DIAGNOSIS — G4700 Insomnia, unspecified: Secondary | ICD-10-CM | POA: Diagnosis not present

## 2016-03-30 DIAGNOSIS — F329 Major depressive disorder, single episode, unspecified: Secondary | ICD-10-CM | POA: Diagnosis not present

## 2016-03-30 DIAGNOSIS — G309 Alzheimer's disease, unspecified: Secondary | ICD-10-CM | POA: Diagnosis not present

## 2016-03-30 DIAGNOSIS — F0280 Dementia in other diseases classified elsewhere without behavioral disturbance: Secondary | ICD-10-CM | POA: Diagnosis not present

## 2016-04-05 DIAGNOSIS — F0280 Dementia in other diseases classified elsewhere without behavioral disturbance: Secondary | ICD-10-CM | POA: Diagnosis not present

## 2016-04-05 DIAGNOSIS — G301 Alzheimer's disease with late onset: Secondary | ICD-10-CM | POA: Diagnosis not present

## 2016-04-08 ENCOUNTER — Encounter
Admission: RE | Admit: 2016-04-08 | Discharge: 2016-04-08 | Disposition: A | Discharge status: Home / Self Care | Payer: Medicare Other | Source: Ambulatory Visit | Attending: Internal Medicine | Admitting: Internal Medicine

## 2016-04-26 ENCOUNTER — Emergency Department: Payer: Medicare Other

## 2016-04-26 ENCOUNTER — Encounter: Payer: Self-pay | Admitting: Emergency Medicine

## 2016-04-26 ENCOUNTER — Emergency Department
Admission: EM | Admit: 2016-04-26 | Discharge: 2016-04-26 | Disposition: A | Discharge status: Home / Self Care | Payer: Medicare Other | Attending: Emergency Medicine | Admitting: Emergency Medicine

## 2016-04-26 DIAGNOSIS — Y939 Activity, unspecified: Secondary | ICD-10-CM | POA: Insufficient documentation

## 2016-04-26 DIAGNOSIS — M545 Low back pain, unspecified: Secondary | ICD-10-CM

## 2016-04-26 DIAGNOSIS — I10 Essential (primary) hypertension: Secondary | ICD-10-CM | POA: Diagnosis not present

## 2016-04-26 DIAGNOSIS — M542 Cervicalgia: Secondary | ICD-10-CM | POA: Diagnosis not present

## 2016-04-26 DIAGNOSIS — Z7982 Long term (current) use of aspirin: Secondary | ICD-10-CM | POA: Insufficient documentation

## 2016-04-26 DIAGNOSIS — S40212A Abrasion of left shoulder, initial encounter: Secondary | ICD-10-CM | POA: Insufficient documentation

## 2016-04-26 DIAGNOSIS — Y929 Unspecified place or not applicable: Secondary | ICD-10-CM | POA: Diagnosis not present

## 2016-04-26 DIAGNOSIS — G309 Alzheimer's disease, unspecified: Secondary | ICD-10-CM | POA: Diagnosis not present

## 2016-04-26 DIAGNOSIS — Z79899 Other long term (current) drug therapy: Secondary | ICD-10-CM | POA: Diagnosis not present

## 2016-04-26 DIAGNOSIS — Y999 Unspecified external cause status: Secondary | ICD-10-CM | POA: Insufficient documentation

## 2016-04-26 DIAGNOSIS — S0990XA Unspecified injury of head, initial encounter: Secondary | ICD-10-CM | POA: Diagnosis not present

## 2016-04-26 DIAGNOSIS — R296 Repeated falls: Secondary | ICD-10-CM

## 2016-04-26 DIAGNOSIS — S0101XA Laceration without foreign body of scalp, initial encounter: Secondary | ICD-10-CM | POA: Diagnosis not present

## 2016-04-26 DIAGNOSIS — S199XXA Unspecified injury of neck, initial encounter: Secondary | ICD-10-CM | POA: Diagnosis not present

## 2016-04-26 DIAGNOSIS — R51 Headache: Secondary | ICD-10-CM | POA: Diagnosis not present

## 2016-04-26 DIAGNOSIS — W19XXXA Unspecified fall, initial encounter: Secondary | ICD-10-CM | POA: Diagnosis not present

## 2016-04-26 LAB — URINALYSIS, COMPLETE (UACMP) WITH MICROSCOPIC
BILIRUBIN URINE: NEGATIVE
Bacteria, UA: NONE SEEN
GLUCOSE, UA: NEGATIVE mg/dL
Ketones, ur: NEGATIVE mg/dL
LEUKOCYTES UA: NEGATIVE
NITRITE: NEGATIVE
PH: 7 (ref 5.0–8.0)
Protein, ur: NEGATIVE mg/dL
SPECIFIC GRAVITY, URINE: 1.012 (ref 1.005–1.030)

## 2016-04-26 LAB — BASIC METABOLIC PANEL
ANION GAP: 6 (ref 5–15)
BUN: 19 mg/dL (ref 6–20)
CALCIUM: 8.6 mg/dL — AB (ref 8.9–10.3)
CO2: 26 mmol/L (ref 22–32)
Chloride: 105 mmol/L (ref 101–111)
Creatinine, Ser: 0.71 mg/dL (ref 0.61–1.24)
GLUCOSE: 118 mg/dL — AB (ref 65–99)
POTASSIUM: 3.8 mmol/L (ref 3.5–5.1)
Sodium: 137 mmol/L (ref 135–145)

## 2016-04-26 LAB — CBC
HCT: 37.7 % — ABNORMAL LOW (ref 40.0–52.0)
Hemoglobin: 12.8 g/dL — ABNORMAL LOW (ref 13.0–18.0)
MCH: 30.9 pg (ref 26.0–34.0)
MCHC: 33.8 g/dL (ref 32.0–36.0)
MCV: 91.2 fL (ref 80.0–100.0)
PLATELETS: 242 10*3/uL (ref 150–440)
RBC: 4.14 MIL/uL — AB (ref 4.40–5.90)
RDW: 14.4 % (ref 11.5–14.5)
WBC: 10.9 10*3/uL — AB (ref 3.8–10.6)

## 2016-04-26 LAB — TROPONIN I

## 2016-04-26 MED ORDER — LIDOCAINE-EPINEPHRINE-TETRACAINE (LET) SOLUTION
NASAL | Status: AC
  Administered 2016-04-26: 3 mL
  Filled 2016-04-26: qty 3

## 2016-04-26 MED ORDER — ACETAMINOPHEN 325 MG PO TABS
650.0000 mg | ORAL_TABLET | Freq: Once | ORAL | Status: AC
  Administered 2016-04-26: 650 mg via ORAL
  Filled 2016-04-26: qty 2

## 2016-04-26 MED ORDER — BACITRACIN-NEOMYCIN-POLYMYXIN 400-5-5000 EX OINT
TOPICAL_OINTMENT | Freq: Once | CUTANEOUS | Status: AC
  Administered 2016-04-26: 11:00:00 via TOPICAL
  Filled 2016-04-26: qty 1

## 2016-04-26 MED ORDER — TRIPLE ANTIBIOTIC 5-400-5000 EX OINT: TOPICAL_OINTMENT | Freq: Three times a day (TID) | CUTANEOUS | 0 refills | Status: DC

## 2016-04-26 NOTE — ED Notes (Signed)
This RN spoke with nursing home staff that pt is ready to picked up for discharge.

## 2016-04-26 NOTE — ED Triage Notes (Signed)
Pt to ed via [facility] from [facility], resides in the memory care. Pt had a fall this am, unwitnessed. Pt does not remember falling or why he fell. NO LOC noted. Pt a/ox2. Pt c/o head and back pain. EMS reports all VSS. edp at bedside.

## 2016-04-26 NOTE — ED Notes (Signed)
Pt noted to have a 3 inch lac to back of head and small abrasion to left scapula.

## 2016-04-26 NOTE — Discharge Instructions (Signed)
Make an appointment for reevaluation with your primary care physician in the next 1-2 days. In addition, make an appointment for staple removal in 5-7 days.  Please keep the area over the staples clean and dry. You may shower but do not soak your scalp until the staples have been removed.  Please apply Neosporin or any triple antibiotic cream and a thick coat 3 times daily over the abrasion on the left scapula has completely healed.  Return to the emergency department for severe pain, changes in mental status, vomiting, inability to walk, or any other symptoms concerning to you.

## 2016-04-26 NOTE — ED Provider Notes (Signed)
[facility] Emergency Department Provider Note  ____________________________________________  Time seen: Approximately 7:18 AM  I have reviewed the triage vital signs and the nursing notes.   HISTORY  Chief Complaint Fall and Head Laceration    HPI Kavin Weckwerth is a 83 y.o. male who lives in the dementia unit at a nursing home, with a history of recurrent falls, remote traumatic brain injury, hypertension, presenting for fall. The patient does not remember anything about the fall and it was unwitnessed. At this time, he is complaining of pain in the back of his head with an associated laceration, as well as low back pain without any numbness tingling, weakness. He was able to ambulate after the fall.Patient takes a daily aspirin but is not otherwise anticoagulated according to his medication list.   Past Medical History:  Diagnosis Date  . Anemia   . Benign prostatic hyperplasia   . Dementia   . Depression   . Dysphagia   . Falling   . Glaucoma   . Hypertension   . Intracranial injury ([place])   . Osteoarthritis   . Sleep apnea   . Spondylosis of cervical joint   . Traumatic brain injury [facility])     Patient Active Problem List   Diagnosis Date Noted  . 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 Clinical depression 02/05/2015  . BP (high blood pressure) 02/05/2015  . Apnea, sleep 02/04/2015  . Fracture of cervical vertebra ([place]) 02/04/2015  . Primary open angle glaucoma 05/01/2014  . Benign prostatic hyperplasia with urinary obstruction 05/22/2012  . Incomplete bladder emptying 03/27/2012  . Urge incontinence of urine 03/27/2012  . FOM (frequency of micturition) 03/27/2012  . Binocular vision disorder with diplopia 08/23/2011    Past Surgical History:  Procedure Laterality Date  . APPENDECTOMY    . HERNIA REPAIR    . TONSILLECTOMY      Current Outpatient Rx  . Order #: [id number] Class: Historical Med  . Order #:  [id number] Class: Historical Med  . Order #: [id number] Class: Historical Med  . Order #: [id number] Class: Historical Med  . Order #: [id number] Class: Historical Med  . Order #: [id number] Class: Historical Med  . Order #: [id number] Class: Historical Med  . Order #: [id number] Class: Historical Med  . Order #: [id number] Class: Historical Med  . Order #: [id number] Class: Historical Med  . Order #: [id number] Class: Historical Med  . Order #: [id number] Class: Historical Med  . Order #: [id number] Class: Historical Med  . Order #: [id number] Class: Historical Med  . Order #: [id number] Class: Historical Med  . Order #: [id number] Class: Historical Med  . Order #: [id number] Class: Historical Med  . Order #: [id number] Class: Historical Med  . Order #: [id number] Class: Historical Med  . Order #: [id number] Class: Print  . Order #: [id number] Class: Historical Med  . Order #: [id number] Class: Historical Med    Allergies Patient has no known allergies.  No family history on file.  Social History Social History  Substance Use Topics  . Smoking status: Never Smoker  . Smokeless tobacco: Never Used  . Alcohol use 0.6 oz/week    1 Cans of beer per week     Comment: day    Review of Systems Constitutional: No fever/chills.No lightheadedness or known syncope although the patient does not remember his fall. Eyes: No visual changes. Alert and double vision at this time. ENT: No congestion or rhinorrhea.  Neck: Denies neck pain. Cardiovascular: Denies chest pain. Denies palpitations. Respiratory: Denies shortness of breath.  No cough. Gastrointestinal: No  abdominal pain.  No nausea, no vomiting.  No diarrhea.  No constipation. Genitourinary: Negative for dysuria. Musculoskeletal: Positive for back pain. Hip pain. No extremity pain. Skin: Negative for rash. Positive for laceration on the posterior scalp. Neurological: Negative for headaches. No focal numbness, tingling or weakness.   10-point ROS otherwise  negative.  ____________________________________________   PHYSICAL EXAM:  VITAL SIGNS: ED Triage Vitals [04/26/16 0712]  Enc Vitals Group     BP (!) 167/82     Pulse Rate 66     Resp 20     Temp 97.6 F (36.4 C)     Temp Source Oral     SpO2 97 %     Weight 160 lb (72.6 kg)     Height 5\' 8"  (1.727 m)     Head Circumference      Peak Flow      Pain Score      Pain Loc      Pain Edu?      Excl. in [place]?     Constitutional: Alert and answering most questions appropriately. GCS is 15. Eyes: Conjunctivae are normal.  EOMI. No scleral icterus. No raccoon eyes. Head: 1.75 inch posterior scalp laceration that is hemostatic. No Battle sign. Nose: No congestion/rhinnorhea. No swelling over the nose or septal hematoma. Mouth/Throat: Mucous membranes are moist. No dental injury or malocclusion. Neck: No stridor.  Supple.   C-spine tenderness to palpation, step-offs or deformities. Cardiovascular: Normal rate, regular rhythm. No murmurs, rubs or gallops.  Respiratory: Normal respiratory effort.  No accessory muscle use or retractions. Lungs CTAB.  No wheezes, rales or ronchi. Gastrointestinal: Soft, nontender and nondistended.  No guarding or rebound.  No peritoneal signs. Musculoskeletal: Pelvis is stable. Full range of motion of the bilateral hips, knees, and ankles without pain. Bilateral range of motion of the wrists, elbows, and shoulders without pain. Diffuse lumbar spine tenderness to palpation without any step-offs or deformity. No T-spine tenderness to palpation, step-offs or deformities. Neurologic:  A&Ox1; things it is 28 and January or February.Marland Kitchen  Speech is clear.  Face and smile are symmetric.  EOMI. PERRLA. Moves all extremities well. Skin:  Skin is warm, dry. Serious scalp laceration as noted above. Additional 4" x 2" left scapular abrasion. Psychiatric: Mood and affect are normal. ____________________________________________   LABS (all labs ordered are listed, but only  abnormal results are displayed)  Labs Reviewed  CBC - Abnormal; Notable for the following:       Result Value   WBC 10.9 (*)    RBC 4.14 (*)    Hemoglobin 12.8 (*)    HCT 37.7 (*)    All other components within normal limits  BASIC METABOLIC PANEL - Abnormal; Notable for the following:    Glucose, Bld 118 (*)    Calcium 8.6 (*)    All other components within normal limits  URINALYSIS, COMPLETE (UACMP) WITH MICROSCOPIC - Abnormal; Notable for the following:    Color, Urine STRAW (*)    APPearance CLEAR (*)    Hgb urine dipstick SMALL (*)    Squamous Epithelial / LPF 0-5 (*)    All other components within normal limits  TROPONIN I   ____________________________________________  EKG  ED ECG REPORT I, Eula Listen, the attending physician, personally viewed and interpreted this ECG.   Date: 04/26/2016  EKG Time: 709  Rate: 70  Rhythm: normal sinus rhythm  Axis: leftward  Intervals:first-degree A-V block   ST&T Change: Nonspecific T-wave inversion in V1.  No ST elevation.  ____________________________________________  RADIOLOGY  Dg Lumbar Spine Complete  Result Date: 04/26/2016 CLINICAL DATA:  Back pain after fall. EXAM: LUMBAR SPINE - COMPLETE 4+ VIEW COMPARISON:  CT scan of August 05, 2015. FINDINGS: No fracture is noted. Minimal grade 1 anterolisthesis is noted at L4-5 secondary to hypertrophy of posterior facet joints. Anterior osteophyte formation is noted at L2-3, L3-4 and L4-5. Degenerative changes also seen involving posterior facet joints of L5-S1. Disc spaces are well-maintained. IMPRESSION: Degenerative changes as described above.  No acute abnormality seen. Electronically Signed   By: Marijo Conception, M.D.   On: 04/26/2016 08:30   Ct Head Wo Contrast  Result Date: 04/26/2016 CLINICAL DATA:  Pain following fall EXAM: CT HEAD WITHOUT CONTRAST CT CERVICAL SPINE WITHOUT CONTRAST TECHNIQUE: Multidetector CT imaging of the head and cervical spine was performed  following the standard protocol without intravenous contrast. Multiplanar CT image reconstructions of the cervical spine were also generated. COMPARISON:  CT cervical spine Jul 02, 2010; head CT July 24, 2010 FINDINGS: CT HEAD FINDINGS Brain: There is moderate generalized sulcal atrophy, stable. The ventricles are within normal limits with respect to size and configuration, unchanged. There is no intracranial mass, hemorrhage, extra-axial fluid collection, or midline shift. There is extensive small vessel disease throughout the centra semiovale bilaterally. Small vessel disease is also noted throughout the internal and external capsules as well as in both lentiform nuclei and heads of caudate nuclei. No new gray-white compartment lesion is evident. No acute infarct evident. Vascular: There is no hyperdense vessel. There is calcification in both carotid siphon regions. There is also calcification in the distal vertebral arteries, more on the left than on the right. Skull: The bony calvarium appears intact. Sinuses/Orbits: There is mucosal thickening in both maxillary antra with a small retention cyst in the anterior inferior left maxillary antrum. There is mucosal thickening in multiple ethmoid air cells bilaterally. Patient is status post scleral banding on the right. Orbits elsewhere appear symmetric and unremarkable. Other: There is opacification in several mastoid air cells on the left. Mastoid air cells on the right are clear. CT CERVICAL SPINE FINDINGS Alignment: There is no spondylolisthesis. Skull base and vertebrae: The craniocervical junction and skull base regions appear normal. Bones are osteoporotic. There is no evident fracture. There are no blastic or lytic bone lesions. There is diffuse idiopathic skeletal hyperostosis with bridging osteophytes along the entire cervical and upper thoracic regions. Soft tissues and spinal canal: Prevertebral soft tissues and predental space regions are normal. No  paraspinous lesions are evident. There is no cord or canal hematoma. No high-grade stenosis. Disc levels: There is moderate disc space narrowing at C5-6. There is slightly less narrowing at C4-5. There is milder disc space narrowing at all other levels. There is facet hypertrophy at multiple levels. Exit foraminal narrowing is moderate at C5-6 bilaterally. There is no frank disc extrusion. Upper chest: Visualized lung apices are clear. There is atherosclerotic calcification in the left subclavian artery. Other: There is carotid artery calcification bilaterally. IMPRESSION: CT head: Atrophy with extensive supratentorial small vessel disease. No intracranial mass, hemorrhage, or extra-axial fluid collection. No evident acute infarct. Foci of paranasal sinus disease are noted. There are areas of arteriovascular calcification. There is left-sided mastoid disease. Evidence of prior scleral banding on the right. CT cervical spine: No fracture or spondylolisthesis. Bones are osteoporotic. There is osteoarthritic change at multiple levels. There is diffuse idiopathic skeletal hyperostosis. There are foci of calcification in each carotid artery  as well as in the left subclavian artery. Appearance similar to prior study. Electronically Signed   By: Lowella Grip III M.D.   On: 04/26/2016 07:57   Ct Cervical Spine Wo Contrast  Result Date: 04/26/2016 CLINICAL DATA:  Pain following fall EXAM: CT HEAD WITHOUT CONTRAST CT CERVICAL SPINE WITHOUT CONTRAST TECHNIQUE: Multidetector CT imaging of the head and cervical spine was performed following the standard protocol without intravenous contrast. Multiplanar CT image reconstructions of the cervical spine were also generated. COMPARISON:  CT cervical spine Jul 02, 2010; head CT July 24, 2010 FINDINGS: CT HEAD FINDINGS Brain: There is moderate generalized sulcal atrophy, stable. The ventricles are within normal limits with respect to size and configuration, unchanged. There is  no intracranial mass, hemorrhage, extra-axial fluid collection, or midline shift. There is extensive small vessel disease throughout the centra semiovale bilaterally. Small vessel disease is also noted throughout the internal and external capsules as well as in both lentiform nuclei and heads of caudate nuclei. No new gray-white compartment lesion is evident. No acute infarct evident. Vascular: There is no hyperdense vessel. There is calcification in both carotid siphon regions. There is also calcification in the distal vertebral arteries, more on the left than on the right. Skull: The bony calvarium appears intact. Sinuses/Orbits: There is mucosal thickening in both maxillary antra with a small retention cyst in the anterior inferior left maxillary antrum. There is mucosal thickening in multiple ethmoid air cells bilaterally. Patient is status post scleral banding on the right. Orbits elsewhere appear symmetric and unremarkable. Other: There is opacification in several mastoid air cells on the left. Mastoid air cells on the right are clear. CT CERVICAL SPINE FINDINGS Alignment: There is no spondylolisthesis. Skull base and vertebrae: The craniocervical junction and skull base regions appear normal. Bones are osteoporotic. There is no evident fracture. There are no blastic or lytic bone lesions. There is diffuse idiopathic skeletal hyperostosis with bridging osteophytes along the entire cervical and upper thoracic regions. Soft tissues and spinal canal: Prevertebral soft tissues and predental space regions are normal. No paraspinous lesions are evident. There is no cord or canal hematoma. No high-grade stenosis. Disc levels: There is moderate disc space narrowing at C5-6. There is slightly less narrowing at C4-5. There is milder disc space narrowing at all other levels. There is facet hypertrophy at multiple levels. Exit foraminal narrowing is moderate at C5-6 bilaterally. There is no frank disc extrusion. Upper  chest: Visualized lung apices are clear. There is atherosclerotic calcification in the left subclavian artery. Other: There is carotid artery calcification bilaterally. IMPRESSION: CT head: Atrophy with extensive supratentorial small vessel disease. No intracranial mass, hemorrhage, or extra-axial fluid collection. No evident acute infarct. Foci of paranasal sinus disease are noted. There are areas of arteriovascular calcification. There is left-sided mastoid disease. Evidence of prior scleral banding on the right. CT cervical spine: No fracture or spondylolisthesis. Bones are osteoporotic. There is osteoarthritic change at multiple levels. There is diffuse idiopathic skeletal hyperostosis. There are foci of calcification in each carotid artery as well as in the left subclavian artery. Appearance similar to prior study. Electronically Signed   By: Lowella Grip III M.D.   On: 04/26/2016 07:57    ____________________________________________   PROCEDURES  Procedure(s) performed: None  Procedures  Critical Care performed: No ____________________________________________   INITIAL IMPRESSION / ASSESSMENT AND PLAN / ED COURSE  Pertinent labs & imaging results that were available during my care of the patient were reviewed by me and considered  in my medical decision making (see chart for details).  83 y.o. male with dementia presenting with a fall resulting in a posterior scalp laceration and lower diffuse nonfocal lumbar spine tenderness. We'll get x-rays of the spine, but at this time there is no evidence of spinal cord or neurologic deficit. We will also get a CT of the head and cervical spine to rule out intercranial or C-spine injury. Since it is an unwitnessed fall and the patient cannot give any details, we'll do basic labs and a urinalysis, as well as a troponin. EKG does not show ischemic changes at this time. Plan reevaluation for final disposition.  LACERATION REPAIR Performed by:  Eula Listen Authorized by: Eula Listen Consent: Verbal consent obtained. Risks and benefits: risks, benefits and alternatives were discussed Consent given by: patient Patient identity confirmed: provided demographic data Prepped and Draped in normal sterile fashion Wound explored  Laceration Location: right posterior scalp  Laceration Length: 1.75 inches  No Foreign Bodies seen or palpated  Anesthesia: local infiltration  Local anesthetic: LET  Anesthetic total: topical  Irrigation method: cleaned with 4x4 and NS Amount of cleaning: standard  Skin closure: 5  Number of sutures: staples  Technique: simple  Patient tolerance: Patient tolerated the procedure well with no immediate complications.  ----------------------------------------- 8:28 AM on 04/26/2016 -----------------------------------------  The patient continues to be hemodynamically stable. His blood work is reassuring, and a CT head and neck do not show any acute processes. I'm awaiting the results of his lumbar spine x-ray, as well as urinalysis. If the patient's workup is negative, I plan to discharge the patient home with instructions for staple removal in 5 days and PMD reevaluation in one to 2 days.  ____________________________________________  FINAL CLINICAL IMPRESSION(S) / ED DIAGNOSES  Final diagnoses:  Scalp laceration, initial encounter  Unwitnessed fall  Acute midline low back pain without sciatica  Abrasion of left scapular region, initial encounter         NEW MEDICATIONS STARTED DURING THIS VISIT:  New Prescriptions   NEOMYCIN-BACITRACIN-POLYMYXIN (NEOSPORIN) [phone] OINTMENT    Apply topically 3 (three) times daily. To left scapula abrasion until healed      Eula Listen, MD 04/26/16 [phone]

## 2016-04-28 ENCOUNTER — Non-Acute Institutional Stay: Payer: Medicare Other | Admitting: Gerontology

## 2016-04-28 DIAGNOSIS — S0101XD Laceration without foreign body of scalp, subsequent encounter: Secondary | ICD-10-CM

## 2016-04-28 DIAGNOSIS — M545 Low back pain: Secondary | ICD-10-CM | POA: Diagnosis not present

## 2016-04-28 DIAGNOSIS — W19XXXA Unspecified fall, initial encounter: Secondary | ICD-10-CM | POA: Diagnosis not present

## 2016-04-28 DIAGNOSIS — M546 Pain in thoracic spine: Secondary | ICD-10-CM | POA: Diagnosis not present

## 2016-04-30 DIAGNOSIS — R102 Pelvic and perineal pain: Secondary | ICD-10-CM | POA: Diagnosis not present

## 2016-04-30 DIAGNOSIS — M533 Sacrococcygeal disorders, not elsewhere classified: Secondary | ICD-10-CM | POA: Diagnosis not present

## 2016-05-09 ENCOUNTER — Encounter
Admission: RE | Admit: 2016-05-09 | Discharge: 2016-05-09 | Disposition: A | Discharge status: Home / Self Care | Payer: Medicare Other | Source: Ambulatory Visit | Attending: Internal Medicine | Admitting: Internal Medicine

## 2016-05-09 NOTE — Progress Notes (Signed)
Location:      Place of Service:  ALF (13) Provider:  Toni Arthurs, NP-C  Kirk Ruths., MD  Patient Care Team: Kirk Ruths, MD as PCP - General (Internal Medicine)  Extended Emergency Contact Information Primary Emergency Contact: [facility] Address: [address] [place] [address] [name] Phone: [phone] Work Phone: [phone] Mobile Phone: [phone] Relation: Son Secondary Emergency Contact: Nelia Shi Address: [address]          [place], [place] [address] [name] of [place] Phone: [phone] Work Phone: [phone] Mobile Phone: [phone] Relation: Daughter  Code Status:  dnr Goals of care: Advanced Directive information Advanced Directives 08/05/2015  Does Patient Have a Medical Advance Directive? Yes  Does patient want to make changes to medical advance directive? No - Patient declined  Copy of [place] in Chart? No - copy requested     Chief Complaint  Patient presents with  . Follow-up    HPI:  Pt is a 83 y.o. [name] seen today for a hospital f/u s/p visit to the ED s/p fall with scalp laceration and low back pain. Pt was seen and treated in the emergency room, receiving a CT of the head and cervical spine. He also received an xray of the lumbar spine. All scans were negative for hemorrhage or fracture. Pt has been having intractable low back pain since the fall. Will repeat the lumbar xray and check thoracic xrays. Scalp laceration is well approximated, staples intact. Dried blood remains in the hair surrounding the area. No redness, no warmth, no drainage. Minimal edema. Pt denies n/v/d/f/c/cp/sob/ha/abd pain/dizziness. VSS. No other complaints.   Past Medical History:  Diagnosis Date  . Anemia   . Benign prostatic hyperplasia   . Dementia   . Depression   . Dysphagia   . Falling   . Glaucoma   . Hypertension   . Intracranial injury ([place])   .  Osteoarthritis   . Sleep apnea   . Spondylosis of cervical joint   . Traumatic brain injury [facility])    Past Surgical History:  Procedure Laterality Date  . APPENDECTOMY    . HERNIA REPAIR    . TONSILLECTOMY      No Known Allergies  Allergies as of 04/28/2016   No Known Allergies     Medication List       Accurate as of 04/28/16 11:59 PM. Always use your most recent med list.          acetaminophen 325 MG tablet Commonly known as:  TYLENOL Take 650 mg by mouth every 6 (six) hours as needed.   amLODipine 5 MG tablet Commonly known as:  NORVASC Take 5 mg by mouth daily.   aspirin EC 81 MG tablet Take 81 mg by mouth daily.   azelastine 0.1 % nasal spray Commonly known as:  ASTELIN Place 2 sprays into both nostrils at bedtime. Use in each nostril as directed   bimatoprost 0.01 % Soln Commonly known as:  LUMIGAN Place 1 drop into the left eye at bedtime.   brimonidine 0.2 % ophthalmic solution Commonly known as:  ALPHAGAN Place 1 drop into the left eye 2 (two) times daily.   carvedilol 6.25 MG tablet Commonly known as:  COREG Take 6.25 mg by mouth 2 (two) times daily with a meal.   cholecalciferol 1000 units tablet Commonly known as:  VITAMIN D Take 2,000 Units by mouth daily.  citalopram 20 MG tablet Commonly known as:  CELEXA Take 20 mg by mouth daily.   coal tar 0.5 % shampoo Commonly known as:  NEUTROGENA T-GEL Apply topically once a week.   donepezil 10 MG tablet Commonly known as:  ARICEPT Take 10 mg by mouth at bedtime.   dorzolamide-timolol 22.3-6.8 MG/ML ophthalmic solution Commonly known as:  COSOPT Place 1 drop into both eyes 2 (two) times daily.   folic acid 361 MCG tablet Commonly known as:  FOLVITE Take 400 mcg by mouth daily.   guaiFENesin 100 MG/5ML Soln Commonly known as:  ROBITUSSIN Take 5 mLs by mouth every 4 (four) hours as needed for cough or to loosen phlegm.   loperamide 2 MG capsule Commonly known as:  IMODIUM Take 2-4  mg by mouth as needed for diarrhea or loose stools.   mometasone 50 MCG/ACT nasal spray Commonly known as:  NASONEX Place 2 sprays into the nose daily.   neomycin-bacitracin-polymyxin [phone] ointment Apply topically 3 (three) times daily. To left scapula abrasion until healed   potassium chloride 10 MEQ CR capsule Commonly known as:  MICRO-K Take 10 mEq by mouth daily.   tamsulosin 0.4 MG Caps capsule Commonly known as:  FLOMAX Take 0.4 mg by mouth daily.   traMADol 50 MG tablet Commonly known as:  ULTRAM Take 50 mg by mouth every 4 (four) hours as needed.   traZODone 50 MG tablet Commonly known as:  DESYREL Take 50 mg by mouth at bedtime as needed for sleep.   vitamin B-12 1000 MCG tablet Commonly known as:  CYANOCOBALAMIN Take 1,000 mcg by mouth daily.       Review of Systems  Unable to perform ROS: Dementia  Constitutional: Negative for activity change, appetite change, chills, diaphoresis and fever.  HENT: Negative for congestion, sneezing, sore throat, trouble swallowing and voice change.   Eyes: Negative.   Respiratory: Negative for apnea, cough, choking, chest tightness, shortness of breath and wheezing.   Cardiovascular: Negative for chest pain.  Gastrointestinal: Negative.   Genitourinary: Negative.   Musculoskeletal: Positive for arthralgias (typical arthritis) and back pain.  Skin: Positive for wound.  Neurological: Negative for dizziness, tremors, syncope, speech difficulty, weakness, numbness and headaches.  Psychiatric/Behavioral: Negative.   All other systems reviewed and are negative.    There is no immunization history on file for this patient. Pertinent  Health Maintenance Due  Topic Date Due  . PNA vac Low Risk Adult (1 of 2 - PCV13) 02/23/1992  . INFLUENZA VACCINE  09/08/2016   No flowsheet data found. Functional Status Survey:    Vitals:   04/28/16 2000  BP: (!) 147/73  Pulse: 79   There is no height or weight on file to  calculate BMI. Physical Exam  Constitutional: He is oriented to person, place, and time. Vital signs are normal. He appears well-developed and well-nourished. He is active and cooperative. He does not appear ill. No distress.  HENT:  Head: Normocephalic and atraumatic.  Mouth/Throat: Uvula is midline, oropharynx is clear and moist and mucous membranes are normal. Mucous membranes are not pale, not dry and not cyanotic.  Eyes: Conjunctivae, EOM and lids are normal. Pupils are equal, round, and reactive to light.  Neck: Trachea normal, normal range of motion and full passive range of motion without pain. Neck supple. No JVD present. No tracheal deviation, no edema and no erythema present. No thyromegaly present.  Cardiovascular: Normal rate, regular rhythm, normal heart sounds, intact distal pulses and normal pulses.  Exam reveals no gallop, no distant heart sounds and no friction rub.   No murmur heard. Pulmonary/Chest: Effort normal. No accessory muscle usage. No respiratory distress. He has no decreased breath sounds. He has no wheezes. He has no rhonchi. He has no rales (faint). He exhibits no tenderness.  Abdominal: Soft. Normal appearance and bowel sounds are normal. He exhibits no distension and no ascites. There is no tenderness.  Musculoskeletal: He exhibits no edema or tenderness.       Lumbar back: He exhibits decreased range of motion and pain.  Expected osteoarthritis, stiffness; calves soft, supple. Negative Homan's sign. Increased difficulty walking d/t back pain  Neurological: He is alert and oriented to person, place, and time. He has normal strength.  Skin: Skin is warm and dry. Laceration (scalp- staples intact) noted. He is not diaphoretic. No cyanosis. No pallor. Nails show no clubbing.  Psychiatric: He has a normal mood and affect. His speech is normal and behavior is normal. Judgment and thought content normal. Cognition and memory are normal.  Nursing note and vitals  reviewed.   Labs reviewed:  Recent Labs  08/05/15 2035 01/29/16 1220 04/26/16 0721  NA 140 141 137  K 3.8 3.8 3.8  CL 108 112* 105  CO2 24 22 26   GLUCOSE 106* 96 118*  BUN 28* 21* 19  CREATININE 1.28* 1.06 0.71  CALCIUM 8.8* 9.1 8.6*  MG  --  2.5*  --     Recent Labs  08/05/15 2035 01/29/16 1220  AST 19 25  ALT 20 27  ALKPHOS 82 68  BILITOT 0.4 0.3  PROT 6.9 7.5  ALBUMIN 3.9 4.0    Recent Labs  08/05/15 2035 01/29/16 1220 04/26/16 0721  WBC 9.4 7.8 10.9*  NEUTROABS 6.5 5.4  --   HGB 12.2* 13.8 12.8*  HCT 36.0* 41.2 37.7*  MCV 89.8 90.5 91.2  PLT 254 237 242   Lab Results  Component Value Date   TSH 1.535 01/29/2016   No results found for: HGBA1C No results found for: CHOL, HDL, LDLCALC, LDLDIRECT, TRIG, CHOLHDL  Significant Diagnostic Results in last 30 days:  No results found.  Assessment/Plan 1. Scalp laceration, subsequent encounter  OK to wash gently with soap and water to remove dried blood  Remove staples in 1 week   2. Acute midline low back pain, with sciatica presence unspecified  Naproxyn 220 mg po Q 12 hours  Aspercreme with Lidocaine- thin film to lumbar-sacral area TID  Tramadol 50 mg po QID scheduled  Tramadol 50 mg po Q 4 hours prn   Family/ staff Communication:   Total Time:  Documentation:  Face to Face:  Family/Phone: spoke with HCPOA for ~10 minutes   Labs/tests ordered:  Complete view xrays lumbar and thorasic  Vikki Ports, NP-C [facility] [address], [place] [address] Cell Phone (Mon-Fri 8am-5pm):  [phone] On Call:  [phone] & follow prompts after 5pm & weekends Office Phone:  [phone] Office Fax:  [phone]

## 2016-05-17 DIAGNOSIS — M6281 Muscle weakness (generalized): Secondary | ICD-10-CM | POA: Diagnosis not present

## 2016-05-17 DIAGNOSIS — R2681 Unsteadiness on feet: Secondary | ICD-10-CM | POA: Diagnosis not present

## 2016-05-18 DIAGNOSIS — R2681 Unsteadiness on feet: Secondary | ICD-10-CM | POA: Diagnosis not present

## 2016-05-18 DIAGNOSIS — M6281 Muscle weakness (generalized): Secondary | ICD-10-CM | POA: Diagnosis not present

## 2016-05-20 DIAGNOSIS — M6281 Muscle weakness (generalized): Secondary | ICD-10-CM | POA: Diagnosis not present

## 2016-05-20 DIAGNOSIS — R2681 Unsteadiness on feet: Secondary | ICD-10-CM | POA: Diagnosis not present

## 2016-05-21 DIAGNOSIS — R2681 Unsteadiness on feet: Secondary | ICD-10-CM | POA: Diagnosis not present

## 2016-05-21 DIAGNOSIS — M6281 Muscle weakness (generalized): Secondary | ICD-10-CM | POA: Diagnosis not present

## 2016-05-24 DIAGNOSIS — M6281 Muscle weakness (generalized): Secondary | ICD-10-CM | POA: Diagnosis not present

## 2016-05-24 DIAGNOSIS — R2681 Unsteadiness on feet: Secondary | ICD-10-CM | POA: Diagnosis not present

## 2016-05-25 DIAGNOSIS — R2681 Unsteadiness on feet: Secondary | ICD-10-CM | POA: Diagnosis not present

## 2016-05-25 DIAGNOSIS — M6281 Muscle weakness (generalized): Secondary | ICD-10-CM | POA: Diagnosis not present

## 2016-05-26 DIAGNOSIS — M6281 Muscle weakness (generalized): Secondary | ICD-10-CM | POA: Diagnosis not present

## 2016-05-26 DIAGNOSIS — R2681 Unsteadiness on feet: Secondary | ICD-10-CM | POA: Diagnosis not present

## 2016-05-27 DIAGNOSIS — M6281 Muscle weakness (generalized): Secondary | ICD-10-CM | POA: Diagnosis not present

## 2016-05-27 DIAGNOSIS — R2681 Unsteadiness on feet: Secondary | ICD-10-CM | POA: Diagnosis not present

## 2016-05-30 DIAGNOSIS — R2681 Unsteadiness on feet: Secondary | ICD-10-CM | POA: Diagnosis not present

## 2016-05-30 DIAGNOSIS — M6281 Muscle weakness (generalized): Secondary | ICD-10-CM | POA: Diagnosis not present

## 2016-06-01 DIAGNOSIS — M6281 Muscle weakness (generalized): Secondary | ICD-10-CM | POA: Diagnosis not present

## 2016-06-01 DIAGNOSIS — R2681 Unsteadiness on feet: Secondary | ICD-10-CM | POA: Diagnosis not present

## 2016-06-08 ENCOUNTER — Encounter
Admission: RE | Admit: 2016-06-08 | Discharge: 2016-06-08 | Disposition: A | Discharge status: Home / Self Care | Payer: Medicare Other | Source: Ambulatory Visit | Attending: Internal Medicine | Admitting: Internal Medicine

## 2016-06-08 ENCOUNTER — Non-Acute Institutional Stay: Payer: Medicare Other | Admitting: Gerontology

## 2016-06-08 DIAGNOSIS — G4700 Insomnia, unspecified: Secondary | ICD-10-CM | POA: Diagnosis not present

## 2016-06-08 DIAGNOSIS — E559 Vitamin D deficiency, unspecified: Secondary | ICD-10-CM

## 2016-06-08 DIAGNOSIS — F329 Major depressive disorder, single episode, unspecified: Secondary | ICD-10-CM

## 2016-06-08 DIAGNOSIS — Z9181 History of falling: Secondary | ICD-10-CM

## 2016-06-08 DIAGNOSIS — R1312 Dysphagia, oropharyngeal phase: Secondary | ICD-10-CM

## 2016-06-08 DIAGNOSIS — M1990 Unspecified osteoarthritis, unspecified site: Secondary | ICD-10-CM | POA: Diagnosis not present

## 2016-06-08 DIAGNOSIS — N138 Other obstructive and reflux uropathy: Secondary | ICD-10-CM

## 2016-06-08 DIAGNOSIS — M545 Low back pain: Secondary | ICD-10-CM

## 2016-06-08 DIAGNOSIS — I10 Essential (primary) hypertension: Secondary | ICD-10-CM | POA: Diagnosis not present

## 2016-06-08 DIAGNOSIS — G301 Alzheimer's disease with late onset: Secondary | ICD-10-CM | POA: Diagnosis not present

## 2016-06-08 DIAGNOSIS — D649 Anemia, unspecified: Secondary | ICD-10-CM

## 2016-06-08 DIAGNOSIS — G4733 Obstructive sleep apnea (adult) (pediatric): Secondary | ICD-10-CM | POA: Diagnosis not present

## 2016-06-08 DIAGNOSIS — N401 Enlarged prostate with lower urinary tract symptoms: Secondary | ICD-10-CM | POA: Diagnosis not present

## 2016-06-08 DIAGNOSIS — H4010X0 Unspecified open-angle glaucoma, stage unspecified: Secondary | ICD-10-CM

## 2016-06-08 DIAGNOSIS — G309 Alzheimer's disease, unspecified: Secondary | ICD-10-CM | POA: Diagnosis not present

## 2016-06-08 DIAGNOSIS — J309 Allergic rhinitis, unspecified: Secondary | ICD-10-CM

## 2016-06-08 DIAGNOSIS — M47812 Spondylosis without myelopathy or radiculopathy, cervical region: Secondary | ICD-10-CM

## 2016-06-08 DIAGNOSIS — F39 Unspecified mood [affective] disorder: Secondary | ICD-10-CM | POA: Diagnosis not present

## 2016-06-08 DIAGNOSIS — E876 Hypokalemia: Secondary | ICD-10-CM

## 2016-06-08 DIAGNOSIS — F0280 Dementia in other diseases classified elsewhere without behavioral disturbance: Secondary | ICD-10-CM

## 2016-06-08 DIAGNOSIS — E538 Deficiency of other specified B group vitamins: Secondary | ICD-10-CM

## 2016-06-15 DIAGNOSIS — H401133 Primary open-angle glaucoma, bilateral, severe stage: Secondary | ICD-10-CM | POA: Diagnosis not present

## 2016-06-24 DIAGNOSIS — E876 Hypokalemia: Secondary | ICD-10-CM | POA: Insufficient documentation

## 2016-06-24 DIAGNOSIS — M47812 Spondylosis without myelopathy or radiculopathy, cervical region: Secondary | ICD-10-CM | POA: Insufficient documentation

## 2016-06-24 DIAGNOSIS — G4700 Insomnia, unspecified: Secondary | ICD-10-CM | POA: Insufficient documentation

## 2016-06-24 DIAGNOSIS — J309 Allergic rhinitis, unspecified: Secondary | ICD-10-CM | POA: Insufficient documentation

## 2016-06-24 DIAGNOSIS — M1990 Unspecified osteoarthritis, unspecified site: Secondary | ICD-10-CM | POA: Insufficient documentation

## 2016-06-24 DIAGNOSIS — E559 Vitamin D deficiency, unspecified: Secondary | ICD-10-CM | POA: Insufficient documentation

## 2016-06-24 DIAGNOSIS — E538 Deficiency of other specified B group vitamins: Secondary | ICD-10-CM | POA: Insufficient documentation

## 2016-06-24 DIAGNOSIS — M545 Low back pain, unspecified: Secondary | ICD-10-CM | POA: Insufficient documentation

## 2016-06-24 DIAGNOSIS — Z9181 History of falling: Secondary | ICD-10-CM | POA: Insufficient documentation

## 2016-06-24 DIAGNOSIS — R1312 Dysphagia, oropharyngeal phase: Secondary | ICD-10-CM | POA: Insufficient documentation

## 2016-06-24 DIAGNOSIS — F0280 Dementia in other diseases classified elsewhere without behavioral disturbance: Secondary | ICD-10-CM | POA: Insufficient documentation

## 2016-06-24 DIAGNOSIS — D649 Anemia, unspecified: Secondary | ICD-10-CM | POA: Insufficient documentation

## 2016-06-24 DIAGNOSIS — F329 Major depressive disorder, single episode, unspecified: Secondary | ICD-10-CM | POA: Insufficient documentation

## 2016-06-24 NOTE — Progress Notes (Signed)
Location:      Place of Service:  ALF (13) Provider:  Toni Arthurs, NP-C  Kirk Ruths, MD  Patient Care Team: Kirk Ruths, MD as PCP - General (Internal Medicine)  Extended Emergency Contact Information Primary Emergency Contact: [facility] Address: [address] [place] [address] [name] of [place] Phone: [phone] Work Phone: [phone] Mobile Phone: [phone] Relation: Son Secondary Emergency Contact: Nelia Shi Address: [address]          [place], [place] [address] [name] of [place] Phone: [phone] Work Phone: [phone] Mobile Phone: [phone] Relation: Daughter  Code Status:  DNR Goals of care: Advanced Directive information Advanced Directives 08/05/2015  Does Patient Have a Medical Advance Directive? Yes  Does patient want to make changes to medical advance directive? No - Patient declined  Copy of [place] in Chart? No - copy requested     Chief Complaint  Patient presents with  . Medical Management of Chronic Issues    HPI:  Pt is a 83 y.o. male seen today for medical management of chronic diseases. Pt has been stable this month. Last fall was in March with subsequent back pain. Pain has since resolved. Pt ambulates on unit with rolling walker. Does not like to participate in activities with others. He prefers to sit in his recliner and watch TV, but does occasionally eat in the dining room for meals. Incontinent of B&B. He has had a gradual mental decline over the past few months. Recently moved to the memory care unit. Friend reports he did not remember his ophthamologist name that he has been seeing for many years (who has the same name as him.) Pt continues to typically eat most to all of each meal. Pt denies pain. Denies n/v/d/f/c/cp/sob/ha/abd pain/dizziness. VSS. No other complaints.    Past Medical History:  Diagnosis Date  . Anemia   . Benign prostatic  hyperplasia   . Dementia   . Depression   . Dysphagia   . Falling   . Glaucoma   . Hypertension   . Intracranial injury ([place])   . Osteoarthritis   . Sleep apnea   . Spondylosis of cervical joint   . Traumatic brain injury [facility])    Past Surgical History:  Procedure Laterality Date  . APPENDECTOMY    . HERNIA REPAIR    . TONSILLECTOMY      No Known Allergies  Allergies as of 06/08/2016   No Known Allergies     Medication List       Accurate as of 06/08/16 11:59 PM. Always use your most recent med list.          acetaminophen 325 MG tablet Commonly known as:  TYLENOL Take 650 mg by mouth every 6 (six) hours as needed.   amLODipine 5 MG tablet Commonly known as:  NORVASC Take 5 mg by mouth daily.   aspirin EC 81 MG tablet Take 81 mg by mouth daily.   azelastine 0.1 % nasal spray Commonly known as:  ASTELIN Place 2 sprays into both nostrils at bedtime. Use in each nostril as directed   bimatoprost 0.01 % Soln Commonly known as:  LUMIGAN Place 1 drop into the left eye at bedtime.   brimonidine 0.2 % ophthalmic solution Commonly known as:  ALPHAGAN Place 1 drop into the left eye 2 (two) times daily.   carvedilol 6.25 MG tablet Commonly known as:  COREG Take  6.25 mg by mouth 2 (two) times daily with a meal.   cholecalciferol 1000 units tablet Commonly known as:  VITAMIN D Take 2,000 Units by mouth daily.   citalopram 20 MG tablet Commonly known as:  CELEXA Take 20 mg by mouth daily.   coal tar 0.5 % shampoo Commonly known as:  NEUTROGENA T-GEL Apply topically once a week.   donepezil 10 MG tablet Commonly known as:  ARICEPT Take 10 mg by mouth at bedtime.   dorzolamide-timolol 22.3-6.8 MG/ML ophthalmic solution Commonly known as:  COSOPT Place 1 drop into both eyes 2 (two) times daily.   folic acid 017 MCG tablet Commonly known as:  FOLVITE Take 400 mcg by mouth daily.   guaiFENesin 100 MG/5ML Soln Commonly known as:  ROBITUSSIN Take 5 mLs by  mouth every 4 (four) hours as needed for cough or to loosen phlegm.   loperamide 2 MG capsule Commonly known as:  IMODIUM Take 2-4 mg by mouth as needed for diarrhea or loose stools.   mometasone 50 MCG/ACT nasal spray Commonly known as:  NASONEX Place 2 sprays into the nose daily.   neomycin-bacitracin-polymyxin [phone] ointment Apply topically 3 (three) times daily. To left scapula abrasion until healed   potassium chloride 10 MEQ CR capsule Commonly known as:  MICRO-K Take 10 mEq by mouth daily.   tamsulosin 0.4 MG Caps capsule Commonly known as:  FLOMAX Take 0.4 mg by mouth daily.   traMADol 50 MG tablet Commonly known as:  ULTRAM Take 50 mg by mouth every 4 (four) hours as needed.   traZODone 50 MG tablet Commonly known as:  DESYREL Take 50 mg by mouth at bedtime as needed for sleep.   vitamin B-12 1000 MCG tablet Commonly known as:  CYANOCOBALAMIN Take 1,000 mcg by mouth daily.       Review of Systems  Unable to perform ROS: Dementia  Constitutional: Negative for activity change, appetite change, chills, diaphoresis and fever.  HENT: Negative for congestion, sneezing, sore throat, trouble swallowing and voice change.   Eyes: Negative.   Respiratory: Negative for apnea, cough, choking, chest tightness, shortness of breath and wheezing.   Cardiovascular: Negative for chest pain.  Gastrointestinal: Negative.   Genitourinary: Negative.   Musculoskeletal: Positive for arthralgias (typical arthritis). Negative for back pain.  Skin: Positive for wound.  Neurological: Negative for dizziness, tremors, syncope, speech difficulty, weakness, numbness and headaches.  Psychiatric/Behavioral: Negative.   All other systems reviewed and are negative.    There is no immunization history on file for this patient. Pertinent  Health Maintenance Due  Topic Date Due  . PNA vac Low Risk Adult (1 of 2 - PCV13) 02/23/1992  . INFLUENZA VACCINE  09/08/2016   No flowsheet data  found. Functional Status Survey:    Vitals:   06/08/16 1545  BP: 138/62  Pulse: 68   There is no height or weight on file to calculate BMI. Physical Exam  Constitutional: He is oriented to person, place, and time. Vital signs are normal. He appears well-developed and well-nourished. He is active and cooperative. He does not appear ill. No distress.  HENT:  Head: Normocephalic and atraumatic.  Mouth/Throat: Uvula is midline, oropharynx is clear and moist and mucous membranes are normal. Mucous membranes are not pale, not dry and not cyanotic.  Eyes: Conjunctivae, EOM and lids are normal. Pupils are equal, round, and reactive to light.  Neck: Trachea normal, normal range of motion and full passive range of motion without pain. Neck  supple. No JVD present. No tracheal deviation, no edema and no erythema present. No thyromegaly present.  Cardiovascular: Normal rate, regular rhythm, normal heart sounds, intact distal pulses and normal pulses.  Exam reveals no gallop, no distant heart sounds and no friction rub.   No murmur heard. Pulmonary/Chest: Effort normal. No accessory muscle usage. No respiratory distress. He has no decreased breath sounds. He has no wheezes. He has no rhonchi. He has no rales. He exhibits no tenderness.  Abdominal: Soft. Normal appearance and bowel sounds are normal. He exhibits no distension and no ascites. There is no tenderness.  Musculoskeletal: He exhibits no edema or tenderness.       Lumbar back: He exhibits decreased range of motion and pain.  Expected osteoarthritis, stiffness; calves soft, supple. Negative Homan's sign. Increased difficulty walking d/t back pain  Neurological: He is alert and oriented to person, place, and time. He has normal strength.  Skin: Skin is warm and dry. Laceration noted. He is not diaphoretic. No cyanosis. No pallor. Nails show no clubbing.  Psychiatric: He has a normal mood and affect. His speech is normal and behavior is normal.  Judgment and thought content normal. Cognition and memory are impaired. He exhibits abnormal recent memory.  Nursing note and vitals reviewed.   Labs reviewed:  Recent Labs  08/05/15 2035 01/29/16 1220 04/26/16 0721  NA 140 141 137  K 3.8 3.8 3.8  CL 108 112* 105  CO2 24 22 26   GLUCOSE 106* 96 118*  BUN 28* 21* 19  CREATININE 1.28* 1.06 0.71  CALCIUM 8.8* 9.1 8.6*  MG  --  2.5*  --     Recent Labs  08/05/15 2035 01/29/16 1220  AST 19 25  ALT 20 27  ALKPHOS 82 68  BILITOT 0.4 0.3  PROT 6.9 7.5  ALBUMIN 3.9 4.0    Recent Labs  08/05/15 2035 01/29/16 1220 04/26/16 0721  WBC 9.4 7.8 10.9*  NEUTROABS 6.5 5.4  --   HGB 12.2* 13.8 12.8*  HCT 36.0* 41.2 37.7*  MCV 89.8 90.5 91.2  PLT 254 237 242   Lab Results  Component Value Date   TSH 1.535 01/29/2016   No results found for: HGBA1C No results found for: CHOL, HDL, LDLCALC, LDLDIRECT, TRIG, CHOLHDL  Significant Diagnostic Results in last 30 days:  No results found.  Assessment/Plan 1. Late onset Alzheimer's disease without behavioral disturbance 2. Dementia in other diseases classified elsewhere without behavioral disturbance  Stable  DC [place]  3. Oropharyngeal dysphagia  Stable   4. Spondylosis without myelopathy or radiculopathy, cervical region  Stable  See osteoarthritis   5. Osteoarthritis, unspecified osteoarthritis type, unspecified site  Stable  Continue Mapap Arthritis Pain (acetaminophen) [OTC] tablet extended release; 650 mg; amt: 1300mg =2tabs PO BID  6. Essential (primary) hypertension  Stable  Continue Amlodipine 5 mg po Q Day  Continue ASA 81 mg po Q Day  Continue Carvedilol 6.25 mg po BID  7. Open-angle glaucoma, unspecified glaucoma stage, unspecified laterality, unspecified open-angle glaucoma type  Stable  Continue brimonidine drops; 0.2 %- 1 drop in the left eye BID  Continue Cosopt (dorzolamide-timolol) drops; 22.3-6.8 mg/m- 1 drop both eyes BID    Continue Lumigan (bimatoprost) drops; 0.01 %; 1 drop in left eye Q HS  8. Mood disorder ([place]) 9. Major depressive disorder with single episode, remission status unspecified  Stable  Continue Celexa 20 mg po Q Day  10. Obstructive sleep apnea (adult) (pediatric)  stable  11. Anemia, unspecified type  Stable   12. Benign prostatic hyperplasia with urinary obstruction  Stable  Continue tamsulosin capsule,extended release 24hr; 0.4 mg; PO Q Day  13. Hx of falling  Stable   14. Low back pain, unspecified back pain laterality, unspecified chronicity, with sciatica presence unspecified  Stable  Continue Aspercreme 4%- thin film TID prn  Continue naproxen sodium [OTC] tablet; 220 mg; amt: 1 tablet po BID  Continue Tramadol 50 mg po BID scheduled  Continue Tramadol 50 mg po Q 4 hours prn  15. Allergic rhinitis, unspecified seasonality, unspecified trigger  Stable  Continue Astepro 0.15% 2 sprays in each nostril Q HS  Continue Nasonex (mometasone) spray,non-aerosol; 50 mcg/actuation; amt: 2sprays into each nostril Q AM  16. Vitamin D deficiency  Stable  Continue Cholecalciferol 2,000 units po Q Day  17. Vitamin B12 deficiency  Stable  Continue Cyanocobalamin 1,000 mcg po Q Day  Continue Folic Acid 471 mcg po Q Day  18. Insomnia, unspecified type  Stable  Continue Trazodone 50 mg po Q HS  19. Hypokalemia  Stable  Continue potassium chloride capsule, extended release; 10 mEq po BID  Family/ staff Communication:   Total Time:  Documentation:  Face to Face:  Family/Phone:   Labs/tests ordered:    Medication list reviewed and assessed for continued appropriateness. Monthly medication orders reviewed and signed.  Vikki Ports, NP-C [facility] [phone], [place] [address] Cell Phone (Mon-Fri 8am-5pm):  [phone] On Call:  [phone] & follow prompts after 5pm &  weekends Office Phone:  [phone] Office Fax:  [phone]

## 2016-07-09 ENCOUNTER — Encounter
Admission: RE | Admit: 2016-07-09 | Discharge: 2016-07-09 | Disposition: A | Discharge status: Home / Self Care | Payer: Medicare Other | Source: Ambulatory Visit | Attending: Internal Medicine | Admitting: Internal Medicine

## 2016-07-14 ENCOUNTER — Non-Acute Institutional Stay (SKILLED_NURSING_FACILITY): Payer: Medicare Other | Admitting: Gerontology

## 2016-07-14 DIAGNOSIS — M1990 Unspecified osteoarthritis, unspecified site: Secondary | ICD-10-CM | POA: Diagnosis not present

## 2016-07-14 DIAGNOSIS — I10 Essential (primary) hypertension: Secondary | ICD-10-CM

## 2016-07-14 DIAGNOSIS — D649 Anemia, unspecified: Secondary | ICD-10-CM

## 2016-07-14 DIAGNOSIS — F329 Major depressive disorder, single episode, unspecified: Secondary | ICD-10-CM

## 2016-07-14 DIAGNOSIS — M545 Low back pain: Secondary | ICD-10-CM

## 2016-07-14 DIAGNOSIS — M47812 Spondylosis without myelopathy or radiculopathy, cervical region: Secondary | ICD-10-CM | POA: Diagnosis not present

## 2016-07-14 DIAGNOSIS — R1312 Dysphagia, oropharyngeal phase: Secondary | ICD-10-CM

## 2016-07-14 DIAGNOSIS — F0280 Dementia in other diseases classified elsewhere without behavioral disturbance: Secondary | ICD-10-CM | POA: Diagnosis not present

## 2016-07-14 DIAGNOSIS — E559 Vitamin D deficiency, unspecified: Secondary | ICD-10-CM

## 2016-07-14 DIAGNOSIS — G4733 Obstructive sleep apnea (adult) (pediatric): Secondary | ICD-10-CM

## 2016-07-14 DIAGNOSIS — J309 Allergic rhinitis, unspecified: Secondary | ICD-10-CM

## 2016-07-14 DIAGNOSIS — G301 Alzheimer's disease with late onset: Secondary | ICD-10-CM

## 2016-07-14 DIAGNOSIS — H4010X0 Unspecified open-angle glaucoma, stage unspecified: Secondary | ICD-10-CM | POA: Diagnosis not present

## 2016-07-14 DIAGNOSIS — E538 Deficiency of other specified B group vitamins: Secondary | ICD-10-CM

## 2016-07-14 DIAGNOSIS — G4700 Insomnia, unspecified: Secondary | ICD-10-CM

## 2016-07-14 DIAGNOSIS — Z9181 History of falling: Secondary | ICD-10-CM

## 2016-07-14 DIAGNOSIS — N401 Enlarged prostate with lower urinary tract symptoms: Secondary | ICD-10-CM | POA: Diagnosis not present

## 2016-07-14 DIAGNOSIS — N138 Other obstructive and reflux uropathy: Secondary | ICD-10-CM

## 2016-07-14 DIAGNOSIS — E876 Hypokalemia: Secondary | ICD-10-CM

## 2016-07-14 DIAGNOSIS — K5900 Constipation, unspecified: Secondary | ICD-10-CM

## 2016-07-14 DIAGNOSIS — F39 Unspecified mood [affective] disorder: Secondary | ICD-10-CM

## 2016-08-01 DIAGNOSIS — K5909 Other constipation: Secondary | ICD-10-CM | POA: Insufficient documentation

## 2016-08-01 NOTE — Progress Notes (Signed)
Location:      Place of Service:  ALF (13) Provider:  Toni Arthurs, NP-C  Kirk Ruths, MD  Patient Care Team: Kirk Ruths, MD as PCP - General (Internal Medicine)  Extended Emergency Contact Information Primary Emergency Contact: [facility] Address: [address] [place] [address] [name] of [place] Phone: [phone] Work Phone: [phone] Mobile Phone: [phone] Relation: Son Secondary Emergency Contact: Nelia Shi Address: [address]          [place], [place] [address] [name] of [place] Phone: [phone] Work Phone: [phone] Mobile Phone: [phone] Relation: Daughter  Code Status:  DNR Goals of care: Advanced Directive information Advanced Directives 08/05/2015  Does Patient Have a Medical Advance Directive? Yes  Does patient want to make changes to medical advance directive? No - Patient declined  Copy of [place] in Chart? No - copy requested     Chief Complaint  Patient presents with  . Medical Management of Chronic Issues    HPI:  Pt is a 83 y.o. male seen today for medical management of chronic diseases. Pt has been stable this month. Last fall was in March with subsequent back pain. Pain has since resolved. Will continue GDR of scheduled Tramadol. Pt ambulates on unit with rolling walker. Does not like to participate in activities with others. He prefers to sit in his recliner and watch TV, but does occasionally eat in the dining room for meals. Incontinent of B&B. He has had a gradual mental decline over the past few months. Recently moved to the memory care unit. Friend reports he did not remember his ophthamologist name that he has been seeing for many years (who has the same name as him.) Trial DC of Namenda initiated last month. Since then, pt has been wandering more and having increased confusion. Will add namenda back into regimen. Pt continues to typically eat  most to all of each meal. Pt denies pain. Denies n/v/d/f/c/cp/sob/ha/abd pain/dizziness. VSS. No other complaints.    Past Medical History:  Diagnosis Date  . Anemia   . Benign prostatic hyperplasia   . Dementia   . Depression   . Dysphagia   . Falling   . Glaucoma   . Hypertension   . Intracranial injury ([place])   . Osteoarthritis   . Sleep apnea   . Spondylosis of cervical joint   . Traumatic brain injury [facility])    Past Surgical History:  Procedure Laterality Date  . APPENDECTOMY    . HERNIA REPAIR    . TONSILLECTOMY      No Known Allergies  Allergies as of 07/14/2016   No Known Allergies     Medication List       Accurate as of 07/14/16 11:59 PM. Always use your most recent med list.          acetaminophen 325 MG tablet Commonly known as:  TYLENOL Take 650 mg by mouth every 6 (six) hours as needed.   amLODipine 5 MG tablet Commonly known as:  NORVASC Take 5 mg by mouth daily.   aspirin EC 81 MG tablet Take 81 mg by mouth daily.   azelastine 0.1 % nasal spray Commonly known as:  ASTELIN Place 2 sprays into both nostrils at bedtime. Use in each nostril as directed   bimatoprost 0.01 % Soln Commonly known as:  LUMIGAN Place 1 drop into the left eye at bedtime.   brimonidine 0.2 %  ophthalmic solution Commonly known as:  ALPHAGAN Place 1 drop into the left eye 2 (two) times daily.   carvedilol 6.25 MG tablet Commonly known as:  COREG Take 6.25 mg by mouth 2 (two) times daily with a meal.   cholecalciferol 1000 units tablet Commonly known as:  VITAMIN D Take 2,000 Units by mouth daily.   citalopram 20 MG tablet Commonly known as:  CELEXA Take 20 mg by mouth daily.   coal tar 0.5 % shampoo Commonly known as:  NEUTROGENA T-GEL Apply topically once a week.   donepezil 10 MG tablet Commonly known as:  ARICEPT Take 10 mg by mouth at bedtime.   dorzolamide-timolol 22.3-6.8 MG/ML ophthalmic solution Commonly known as:  COSOPT Place 1 drop into both  eyes 2 (two) times daily.   folic acid 751 MCG tablet Commonly known as:  FOLVITE Take 400 mcg by mouth daily.   guaiFENesin 100 MG/5ML Soln Commonly known as:  ROBITUSSIN Take 5 mLs by mouth every 4 (four) hours as needed for cough or to loosen phlegm.   loperamide 2 MG capsule Commonly known as:  IMODIUM Take 2-4 mg by mouth as needed for diarrhea or loose stools.   mometasone 50 MCG/ACT nasal spray Commonly known as:  NASONEX Place 2 sprays into the nose daily.   neomycin-bacitracin-polymyxin [phone] ointment Apply topically 3 (three) times daily. To left scapula abrasion until healed   potassium chloride 10 MEQ CR capsule Commonly known as:  MICRO-K Take 10 mEq by mouth daily.   tamsulosin 0.4 MG Caps capsule Commonly known as:  FLOMAX Take 0.4 mg by mouth daily.   traMADol 50 MG tablet Commonly known as:  ULTRAM Take 50 mg by mouth every 4 (four) hours as needed.   traZODone 50 MG tablet Commonly known as:  DESYREL Take 50 mg by mouth at bedtime as needed for sleep.   vitamin B-12 1000 MCG tablet Commonly known as:  CYANOCOBALAMIN Take 1,000 mcg by mouth daily.       Review of Systems  Unable to perform ROS: Dementia  Constitutional: Negative for activity change, appetite change, chills, diaphoresis and fever.  HENT: Negative for congestion, sneezing, sore throat, trouble swallowing and voice change.   Eyes: Negative.   Respiratory: Negative for apnea, cough, choking, chest tightness, shortness of breath and wheezing.   Cardiovascular: Negative for chest pain.  Gastrointestinal: Negative.   Genitourinary: Negative.   Musculoskeletal: Positive for arthralgias (typical arthritis). Negative for back pain.  Skin: Positive for wound.  Neurological: Negative for dizziness, tremors, syncope, speech difficulty, weakness, numbness and headaches.  Psychiatric/Behavioral: Negative.   All other systems reviewed and are negative.    There is no immunization  history on file for this patient. Pertinent  Health Maintenance Due  Topic Date Due  . PNA vac Low Risk Adult (1 of 2 - PCV13) 02/23/1992  . INFLUENZA VACCINE  09/08/2016   No flowsheet data found. Functional Status Survey:    Vitals:   07/14/16 1900  BP: (!) 141/73  Pulse: 71  Resp: 20  Temp: 97.4 F (36.3 C)  SpO2: 98%  Weight: 156 lb (70.8 kg)   Body mass index is 23.72 kg/m. Physical Exam  Constitutional: He is oriented to person, place, and time. Vital signs are normal. He appears well-developed and well-nourished. He is active and cooperative. He does not appear ill. No distress.  HENT:  Head: Normocephalic and atraumatic.  Mouth/Throat: Uvula is midline, oropharynx is clear and moist and mucous membranes are  normal. Mucous membranes are not pale, not dry and not cyanotic.  Eyes: Conjunctivae, EOM and lids are normal. Pupils are equal, round, and reactive to light.  Neck: Trachea normal, normal range of motion and full passive range of motion without pain. Neck supple. No JVD present. No tracheal deviation, no edema and no erythema present. No thyromegaly present.  Cardiovascular: Normal rate, regular rhythm, normal heart sounds, intact distal pulses and normal pulses.  Exam reveals no gallop, no distant heart sounds and no friction rub.   No murmur heard. Pulmonary/Chest: Effort normal. No accessory muscle usage. No respiratory distress. He has no decreased breath sounds. He has no wheezes. He has no rhonchi. He has no rales. He exhibits no tenderness.  Abdominal: Soft. Normal appearance and bowel sounds are normal. He exhibits no distension and no ascites. There is no tenderness.  Musculoskeletal: He exhibits no edema or tenderness.       Lumbar back: He exhibits decreased range of motion and pain.  Expected osteoarthritis, stiffness; calves soft, supple. Negative Homan's sign. Increased difficulty walking d/t back pain  Neurological: He is alert and oriented to person,  place, and time. He has normal strength.  Skin: Skin is warm and dry. Laceration noted. He is not diaphoretic. No cyanosis. No pallor. Nails show no clubbing.  Psychiatric: He has a normal mood and affect. His speech is normal and behavior is normal. Judgment and thought content normal. Cognition and memory are impaired. He exhibits abnormal recent memory.  Nursing note and vitals reviewed.   Labs reviewed:  Recent Labs  08/05/15 2035 01/29/16 1220 04/26/16 0721  NA 140 141 137  K 3.8 3.8 3.8  CL 108 112* 105  CO2 24 22 26   GLUCOSE 106* 96 118*  BUN 28* 21* 19  CREATININE 1.28* 1.06 0.71  CALCIUM 8.8* 9.1 8.6*  MG  --  2.5*  --     Recent Labs  08/05/15 2035 01/29/16 1220  AST 19 25  ALT 20 27  ALKPHOS 82 68  BILITOT 0.4 0.3  PROT 6.9 7.5  ALBUMIN 3.9 4.0    Recent Labs  08/05/15 2035 01/29/16 1220 04/26/16 0721  WBC 9.4 7.8 10.9*  NEUTROABS 6.5 5.4  --   HGB 12.2* 13.8 12.8*  HCT 36.0* 41.2 37.7*  MCV 89.8 90.5 91.2  PLT 254 237 242   Lab Results  Component Value Date   TSH 1.535 01/29/2016   No results found for: HGBA1C No results found for: CHOL, HDL, LDLCALC, LDLDIRECT, TRIG, CHOLHDL  Significant Diagnostic Results in last 30 days:  No results found.  Assessment/Plan 1. Late onset Alzheimer's disease without behavioral disturbance 2. Dementia in other diseases classified elsewhere without behavioral disturbance  Stable  Restart Namenda 5 mg po BID  3. Oropharyngeal dysphagia  Stable   4. Spondylosis without myelopathy or radiculopathy, cervical region  Stable  See osteoarthritis   5. Osteoarthritis, unspecified osteoarthritis type, unspecified site  Stable  Continue Mapap Arthritis Pain (acetaminophen) [OTC] tablet extended release; 650 mg; amt: 1300mg =2tabs PO BID  6. Essential (primary) hypertension  Stable  Continue Amlodipine 5 mg po Q Day  Continue ASA 81 mg po Q Day  Continue Carvedilol 6.25 mg po BID  7. Open-angle  glaucoma, unspecified glaucoma stage, unspecified laterality, unspecified open-angle glaucoma type  Stable  Continue brimonidine drops; 0.2 %- 1 drop in the left eye BID  Continue Cosopt (dorzolamide-timolol) drops; 22.3-6.8 mg/m- 1 drop both eyes BID   Continue Lumigan (bimatoprost) drops; 0.01 %;  1 drop in left eye Q HS  8. Mood disorder ([place]) 9. Major depressive disorder with single episode, remission status unspecified  Stable  Continue Celexa 20 mg po Q Day  10. Obstructive sleep apnea (adult) (pediatric)  stable  11. Anemia, unspecified type  Stable   12. Benign prostatic hyperplasia with urinary obstruction  Stable  Continue tamsulosin capsule,extended release 24hr; 0.4 mg; PO Q Day  13. Hx of falling  Stable   14. Low back pain, unspecified back pain laterality, unspecified chronicity, with sciatica presence unspecified  Stable  Continue Aspercreme 4%- thin film TID prn  Continue naproxen sodium [OTC] tablet; 220 mg; amt: 1 tablet po BID  Reduce Tramadol 50 mg po scheduled to Q day (GDR)  Continue Tramadol 50 mg po Q 4 hours prn  15. Allergic rhinitis, unspecified seasonality, unspecified trigger  Stable  Continue Astepro 0.15% 2 sprays in each nostril Q HS  Continue Nasonex (mometasone) spray,non-aerosol; 50 mcg/actuation; amt: 2sprays into each nostril Q AM  16. Vitamin D deficiency  Stable  Continue Cholecalciferol 2,000 units po Q Day  17. Vitamin B12 deficiency  Stable  Continue Cyanocobalamin 1,000 mcg po Q Day  Continue Folic Acid 294 mcg po Q Day  18. Insomnia, unspecified type  Stable  Continue Trazodone 50 mg po Q HS  19. Hypokalemia  Stable  Continue potassium chloride capsule, extended release; 10 mEq po BID  20. Constipation  Stable   Senna S 1 tablet po Q HS  Family/ staff Communication:   Total Time:  Documentation:  Face to Face:  Family/Phone:   Labs/tests ordered:    Medication list reviewed  and assessed for continued appropriateness. Monthly medication orders reviewed and signed.  Vikki Ports, NP-C Geriatrics [facility] [phone], [place] [address] Cell Phone (Mon-Fri 8am-5pm):  [phone] On Call:  [phone] & follow prompts after 5pm & weekends Office Phone:  [phone] Office Fax:  [phone]

## 2016-08-08 ENCOUNTER — Encounter
Admission: RE | Admit: 2016-08-08 | Discharge: 2016-08-08 | Disposition: A | Discharge status: Home / Self Care | Payer: Medicare Other | Source: Ambulatory Visit | Attending: Internal Medicine | Admitting: Internal Medicine

## 2016-08-09 DIAGNOSIS — G301 Alzheimer's disease with late onset: Secondary | ICD-10-CM | POA: Diagnosis not present

## 2016-08-09 DIAGNOSIS — I5032 Chronic diastolic (congestive) heart failure: Secondary | ICD-10-CM | POA: Diagnosis not present

## 2016-08-09 DIAGNOSIS — F0280 Dementia in other diseases classified elsewhere without behavioral disturbance: Secondary | ICD-10-CM | POA: Diagnosis not present

## 2016-08-09 DIAGNOSIS — I10 Essential (primary) hypertension: Secondary | ICD-10-CM | POA: Diagnosis not present

## 2016-08-24 DIAGNOSIS — H401133 Primary open-angle glaucoma, bilateral, severe stage: Secondary | ICD-10-CM | POA: Diagnosis not present

## 2016-09-03 ENCOUNTER — Other Ambulatory Visit
Admission: RE | Admit: 2016-09-03 | Discharge: 2016-09-03 | Disposition: A | Discharge status: Home / Self Care | Payer: Medicare Other | Source: Skilled Nursing Facility | Attending: Internal Medicine | Admitting: Internal Medicine

## 2016-09-03 DIAGNOSIS — R410 Disorientation, unspecified: Secondary | ICD-10-CM | POA: Diagnosis not present

## 2016-09-03 LAB — URINALYSIS, COMPLETE (UACMP) WITH MICROSCOPIC
BILIRUBIN URINE: NEGATIVE
GLUCOSE, UA: NEGATIVE mg/dL
KETONES UR: NEGATIVE mg/dL
NITRITE: NEGATIVE
PH: 6 (ref 5.0–8.0)
Protein, ur: 30 mg/dL — AB
Specific Gravity, Urine: 1.016 (ref 1.005–1.030)
Squamous Epithelial / LPF: NONE SEEN

## 2016-09-06 DIAGNOSIS — H401123 Primary open-angle glaucoma, left eye, severe stage: Secondary | ICD-10-CM | POA: Diagnosis not present

## 2016-09-06 LAB — URINE CULTURE

## 2016-09-08 ENCOUNTER — Encounter
Admission: RE | Admit: 2016-09-08 | Discharge: 2016-09-08 | Disposition: A | Discharge status: Home / Self Care | Payer: Medicare Other | Source: Ambulatory Visit | Attending: Internal Medicine | Admitting: Internal Medicine

## 2016-09-13 ENCOUNTER — Encounter: Payer: Self-pay | Admitting: Gerontology

## 2016-09-13 ENCOUNTER — Non-Acute Institutional Stay: Payer: Medicare Other | Admitting: Gerontology

## 2016-09-13 DIAGNOSIS — F329 Major depressive disorder, single episode, unspecified: Secondary | ICD-10-CM | POA: Diagnosis not present

## 2016-09-13 DIAGNOSIS — I10 Essential (primary) hypertension: Secondary | ICD-10-CM

## 2016-09-13 DIAGNOSIS — F39 Unspecified mood [affective] disorder: Secondary | ICD-10-CM | POA: Diagnosis not present

## 2016-09-13 DIAGNOSIS — N138 Other obstructive and reflux uropathy: Secondary | ICD-10-CM

## 2016-09-13 DIAGNOSIS — D649 Anemia, unspecified: Secondary | ICD-10-CM

## 2016-09-13 DIAGNOSIS — M1990 Unspecified osteoarthritis, unspecified site: Secondary | ICD-10-CM | POA: Diagnosis not present

## 2016-09-13 DIAGNOSIS — M47812 Spondylosis without myelopathy or radiculopathy, cervical region: Secondary | ICD-10-CM

## 2016-09-13 DIAGNOSIS — G4700 Insomnia, unspecified: Secondary | ICD-10-CM

## 2016-09-13 DIAGNOSIS — H4010X0 Unspecified open-angle glaucoma, stage unspecified: Secondary | ICD-10-CM

## 2016-09-13 DIAGNOSIS — F0280 Dementia in other diseases classified elsewhere without behavioral disturbance: Secondary | ICD-10-CM

## 2016-09-13 DIAGNOSIS — E876 Hypokalemia: Secondary | ICD-10-CM

## 2016-09-13 DIAGNOSIS — G301 Alzheimer's disease with late onset: Secondary | ICD-10-CM

## 2016-09-13 DIAGNOSIS — N401 Enlarged prostate with lower urinary tract symptoms: Secondary | ICD-10-CM

## 2016-09-13 DIAGNOSIS — R1312 Dysphagia, oropharyngeal phase: Secondary | ICD-10-CM | POA: Diagnosis not present

## 2016-09-13 DIAGNOSIS — G4733 Obstructive sleep apnea (adult) (pediatric): Secondary | ICD-10-CM | POA: Diagnosis not present

## 2016-09-13 DIAGNOSIS — K5900 Constipation, unspecified: Secondary | ICD-10-CM

## 2016-09-13 DIAGNOSIS — Z9181 History of falling: Secondary | ICD-10-CM

## 2016-09-13 DIAGNOSIS — E538 Deficiency of other specified B group vitamins: Secondary | ICD-10-CM

## 2016-09-13 DIAGNOSIS — E559 Vitamin D deficiency, unspecified: Secondary | ICD-10-CM

## 2016-09-13 DIAGNOSIS — M545 Low back pain: Secondary | ICD-10-CM

## 2016-09-13 DIAGNOSIS — J309 Allergic rhinitis, unspecified: Secondary | ICD-10-CM

## 2016-09-13 NOTE — Progress Notes (Addendum)
Location:   [facility] Room Number: [id number] Place of Service:  [phone]) Provider:  Toni Arthurs, NP-C  Kirk Ruths, MD  Patient Care Team: Kirk Ruths, MD as PCP - General (Internal Medicine)  Extended Emergency Contact Information Primary Emergency Contact: [facility] Address: [address] [place] [address] [name] of [place] Phone: [phone] Work Phone: [phone] Mobile Phone: [phone] Relation: Son Secondary Emergency Contact: Nelia Shi Address: [address]          [place],  [address] [name] of [place] Phone: [phone] Work Phone: [phone] Mobile Phone: [phone] Relation: Daughter  Code Status:  DNR Goals of care: Advanced Directive information Advanced Directives 09/13/2016  Does Patient Have a Medical Advance Directive? Yes  Type of Advance Directive Out of facility DNR (pink MOST or yellow form)  Does patient want to make changes to medical advance directive? No - Patient declined  Copy of [place] in Chart? -     Chief Complaint  Patient presents with  . Medical Management of Chronic Issues    Routine Visit    HPI:  Pt is a 83 y.o. male seen today for medical management of chronic diseases. Pt has been stable this month. No falls this past month. Pt ambulates on unit with rolling walker. Does not like to participate in activities with others. He prefers to sit in his recliner and watch TV, but does occasionally eat in the dining room for meals. Incontinent of B&B. He has had a gradual mental decline over the past few months. Recently moved to the memory care unit. Friend reports he did not remember his ophthamologist name that he has been seeing for many years (who has the same name as him.)  Pt continues to typically eat most to all of each meal. Pt denies pain. Pt did have a recent diagnosis of UTI. On antibiotics without adverse  effects. Denies n/v/d/f/c/cp/sob/ha/abd pain/dizziness. Denies CV tenderness. VSS. No other complaints.    Past Medical History:  Diagnosis Date  . Anemia   . Benign prostatic hyperplasia   . BPH without obstruction/lower urinary tract symptoms 03/27/2012  . Cervical vertebral fracture (HCC)    C7 with small subdural hematoma  . Dementia   . Depression 02/05/2015   unspecified  . Dysphagia   . Facial fracture ([place])   . Falling   . Glaucoma   . Hypertension   . Intracranial injury ([place])   . Macular degeneration    early/dry  . Malignant neoplasm of prostate ([place]) 05/22/2012  . Osteoarthritis   . POAG (primary open-angle glaucoma)   . Sleep apnea 02/04/2015  . Spondylosis of cervical joint   . Subdural hematoma ([place])   . Traumatic brain injury ([place])   . Urge incontinence 03/27/2012   Past Surgical History:  Procedure Laterality Date  . APPENDECTOMY    . AQUEOUS SHUNT Right 03/17/2015   Procedure:  . CHOLECYSTECTOMY     x 2  . GALLBLADDER SURGERY    . GLAUCOMA SURGERY Right 12/2006  . HERNIA REPAIR    . LENS SURGERY Right 12/29/2010   With i-stent  . LENS SURGERY Left 02/28/2011   With i-stent  . TONSILLECTOMY    . TURP VAPORIZATION      No Known Allergies  Allergies as of 09/13/2016   No Known Allergies     Medication List  Accurate as of 09/13/16  1:23 PM. Always use your most recent med list.          amLODipine 5 MG tablet Commonly known as:  NORVASC Take 5 mg by mouth daily.   ASPERCREME W/LIDOCAINE 4 % cream Generic drug:  lidocaine Apply 1 application topically 3 (three) times daily as needed. Apply thin film to lumbo-sacral area for pain   aspirin EC 81 MG tablet Take 81 mg by mouth daily.   ASTEPRO 0.15 % Soln Generic drug:  Azelastine HCl Place 2 sprays into both nostrils at bedtime.   brimonidine 0.2 % ophthalmic solution Commonly known as:  ALPHAGAN Place 1 drop into the left eye 2 (two) times daily. Per Verneita Griffes, MD.  Wait 5 minutes between admin of multiple eye drops   carvedilol 6.25 MG tablet Commonly known as:  COREG Take 6.25 mg by mouth every 12 (twelve) hours.   cholecalciferol 1000 units tablet Commonly known as:  VITAMIN D Take 2,000 Units by mouth daily.   ciprofloxacin 250 MG tablet Commonly known as:  CIPRO Take 250 mg by mouth every 12 (twelve) hours.   citalopram 20 MG tablet Commonly known as:  CELEXA Take 20 mg by mouth daily.   coal tar 0.5 % shampoo Commonly known as:  NEUTROGENA T-GEL Apply 1 application topically once a week. On thursdays   dorzolamide-timolol 22.3-6.8 MG/ML ophthalmic solution Commonly known as:  COSOPT Place 1 drop into both eyes 2 (two) times daily. Per Dr. Arlis Porta. Wait 5 mins between admin of multiple eye drops   ENDIT EX Apply liberal amount topically to areas of skin irritation as needed. Ok to leave at bedside.   folic acid 832 MCG tablet Commonly known as:  FOLVITE Take 400 mcg by mouth daily.   loperamide 2 MG capsule Commonly known as:  IMODIUM Give 4 mg by mouth prn for loose stools and 2 mg after each subsequent loose stool up to 8 doses in 24 hours   magnesium hydroxide 400 MG/5ML suspension Commonly known as:  MILK OF MAGNESIA Take 30 mLs by mouth daily as needed for mild constipation. For constipation no results in 24 hours may administer bisacodyl supp   MAPAP ARTHRITIS PAIN 650 MG CR tablet Generic drug:  acetaminophen Take 1,300 mg by mouth 2 (two) times daily.   memantine 5 MG tablet Commonly known as:  NAMENDA Take 5 mg by mouth 2 (two) times daily.   mometasone 50 MCG/ACT nasal spray Commonly known as:  NASONEX Place 2 sprays into the nose every morning.   potassium chloride 10 MEQ CR capsule Commonly known as:  MICRO-K Take 10 mEq by mouth daily.   prednisoLONE acetate 1 % ophthalmic suspension Commonly known as:  PRED FORTE Place 1 drop into the left eye 4 (four) times daily. Per Dr. Arlis Porta. Wait 5 minutes  between drops, Shake before giving drops   sennosides-docusate sodium 8.6-50 MG tablet Commonly known as:  SENOKOT-S Take 1 tablet by mouth at bedtime.   tamsulosin 0.4 MG Caps capsule Commonly known as:  FLOMAX Take 0.4 mg by mouth daily.   traMADol 50 MG tablet Commonly known as:  ULTRAM Take 50 mg by mouth every 4 (four) hours as needed.   traZODone 50 MG tablet Commonly known as:  DESYREL Take 50 mg by mouth at bedtime as needed for sleep. Ok to hold if pt is sleeping   trimethoprim-polymyxin b ophthalmic solution Commonly known as:  POLYTRIM Place 1 drop into the  left eye 4 (four) times daily. Per Dr. Arlis Porta   vitamin B-12 1000 MCG tablet Commonly known as:  CYANOCOBALAMIN Take 1,000 mcg by mouth daily.       Review of Systems  Unable to perform ROS: Dementia  Constitutional: Negative for activity change, appetite change, chills, diaphoresis and fever.  HENT: Negative for congestion, sneezing, sore throat, trouble swallowing and voice change.   Eyes: Negative.   Respiratory: Negative for apnea, cough, choking, chest tightness, shortness of breath and wheezing.   Cardiovascular: Negative for chest pain.  Gastrointestinal: Negative.   Genitourinary: Negative.   Musculoskeletal: Positive for arthralgias (typical arthritis). Negative for back pain.  Skin: Positive for wound.  Neurological: Negative for dizziness, tremors, syncope, speech difficulty, weakness, numbness and headaches.  Psychiatric/Behavioral: Negative.   All other systems reviewed and are negative.   Immunization History  Administered Date(s) Administered  . Influenza-Unspecified 11/14/2015  . PPD Test 03/05/2016   Pertinent  Health Maintenance Due  Topic Date Due  . PNA vac Low Risk Adult (1 of 2 - PCV13) 02/23/1992  . INFLUENZA VACCINE  09/08/2016   No flowsheet data found. Functional Status Survey:    Vitals:   09/13/16 1235  BP: (!) 160/61  Pulse: 70  Resp: 20  Temp: 97.6 F (36.4  C)  SpO2: 96%  Weight: 159 lb 12.8 oz (72.5 kg)  Height: 5\' 5"  (1.651 m)   Body mass index is 26.59 kg/m. Physical Exam  Constitutional: He is oriented to person, place, and time. Vital signs are normal. He appears well-developed and well-nourished. He is active and cooperative. He does not appear ill. No distress.  HENT:  Head: Normocephalic and atraumatic.  Mouth/Throat: Uvula is midline, oropharynx is clear and moist and mucous membranes are normal. Mucous membranes are not pale, not dry and not cyanotic.  Eyes: Pupils are equal, round, and reactive to light. Conjunctivae, EOM and lids are normal.  Neck: Trachea normal, normal range of motion and full passive range of motion without pain. Neck supple. No JVD present. No tracheal deviation, no edema and no erythema present. No thyromegaly present.  Cardiovascular: Normal rate, regular rhythm, normal heart sounds, intact distal pulses and normal pulses.  Exam reveals no gallop, no distant heart sounds and no friction rub.   No murmur heard. Pulmonary/Chest: Effort normal. No accessory muscle usage. No respiratory distress. He has no decreased breath sounds. He has no wheezes. He has no rhonchi. He has no rales. He exhibits no tenderness.  Abdominal: Soft. Normal appearance and bowel sounds are normal. He exhibits no distension and no ascites. There is no tenderness.  Musculoskeletal: He exhibits no edema or tenderness.       Lumbar back: He exhibits decreased range of motion and pain.  Expected osteoarthritis, stiffness; calves soft, supple. Negative Homan's sign. Increased difficulty walking d/t back pain  Neurological: He is alert and oriented to person, place, and time. He has normal strength.  Skin: Skin is warm and dry. Laceration noted. He is not diaphoretic. No cyanosis. No pallor. Nails show no clubbing.  Psychiatric: He has a normal mood and affect. His speech is normal and behavior is normal. Judgment and thought content normal.  Cognition and memory are impaired. He exhibits abnormal recent memory.  Nursing note and vitals reviewed.   Labs reviewed:  Recent Labs  01/29/16 1220 04/26/16 0721  NA 141 137  K 3.8 3.8  CL 112* 105  CO2 22 26  GLUCOSE 96 118*  BUN 21* 19  CREATININE 1.06 0.71  CALCIUM 9.1 8.6*  MG 2.5*  --     Recent Labs  01/29/16 1220  AST 25  ALT 27  ALKPHOS 68  BILITOT 0.3  PROT 7.5  ALBUMIN 4.0    Recent Labs  01/29/16 1220 04/26/16 0721  WBC 7.8 10.9*  NEUTROABS 5.4  --   HGB 13.8 12.8*  HCT 41.2 37.7*  MCV 90.5 91.2  PLT 237 242   Lab Results  Component Value Date   TSH 1.535 01/29/2016   No results found for: HGBA1C No results found for: CHOL, HDL, LDLCALC, LDLDIRECT, TRIG, CHOLHDL  Significant Diagnostic Results in last 30 days:  No results found.  Assessment/Plan 1. Late onset Alzheimer's disease without behavioral disturbance 2. Dementia in other diseases classified elsewhere without behavioral disturbance  Stable  Continue Namenda 5 mg po BID  3. Oropharyngeal dysphagia  Stable   4. Spondylosis without myelopathy or radiculopathy, cervical region  Stable  See osteoarthritis   5. Osteoarthritis, unspecified osteoarthritis type, unspecified site  Stable  Continue Mapap Arthritis Pain (acetaminophen) [OTC] tablet extended release; 650 mg; amt: 1300mg =2tabs PO BID  6. Essential (primary) hypertension  Stable  Continue Amlodipine 5 mg po Q Day  Continue ASA 81 mg po Q Day  Continue Carvedilol 6.25 mg po BID  7. Open-angle glaucoma, unspecified glaucoma stage, unspecified laterality, unspecified open-angle glaucoma type  Stable  Continue brimonidine drops; 0.2 %- 1 drop in the left eye BID  Continue Cosopt (dorzolamide-timolol) drops; 22.3-6.8 mg/m- 1 drop both eyes BID   Continue Lumigan (bimatoprost) drops; 0.01 %; 1 drop in left eye Q HS  8. Mood disorder ([place]) 9. Major depressive disorder with single episode, remission  status unspecified  Stable  Continue Celexa 20 mg po Q Day  10. Obstructive sleep apnea (adult) (pediatric)  stable  11. Anemia, unspecified type  Stable   12. Benign prostatic hyperplasia with urinary obstruction  Stable  Continue tamsulosin capsule,extended release 24hr; 0.4 mg; PO Q Day  13. Hx of falling  Stable   14. Low back pain, unspecified back pain laterality, unspecified chronicity, with sciatica presence unspecified  Stable  Continue Aspercreme 4%- thin film TID prn  Continue naproxen sodium [OTC] tablet; 220 mg; amt: 1 tablet po BID  Continue Tramadol 50 mg 1-2 tablets po Q 4 hours prn  15. Allergic rhinitis, unspecified seasonality, unspecified trigger  Stable  Continue Astepro 0.15% 2 sprays in each nostril Q HS  Continue Nasonex (mometasone) spray,non-aerosol; 50 mcg/actuation; amt: 2sprays into each nostril Q AM  16. Vitamin D deficiency  Stable  Continue Cholecalciferol 2,000 units po Q Day  17. Vitamin B12 deficiency  Stable  Continue Cyanocobalamin 1,000 mcg po Q Day  Continue Folic Acid 381 mcg po Q Day  18. Insomnia, unspecified type  Stable  Continue Trazodone 50 mg po Q HS  19. Hypokalemia  Stable  Continue potassium chloride capsule, extended release; 10 mEq po BID  20. Constipation  Stable   Continue Senna S 1 tablet po Q HS  Family/ staff Communication:   Total Time:  Documentation:  Face to Face:  Family/Phone:   Labs/tests ordered:    Medication list reviewed and assessed for continued appropriateness. Monthly medication orders reviewed and signed.  Vikki Ports, NP-C Geriatrics [facility] [phone] [place], [place] [address] Cell Phone (Mon-Fri 8am-5pm):  [phone] On Call:  [phone] & follow prompts after 5pm & weekends Office Phone:  [phone] Office  Fax:  [phone]

## 2016-09-14 DIAGNOSIS — F0280 Dementia in other diseases classified elsewhere without behavioral disturbance: Secondary | ICD-10-CM | POA: Diagnosis not present

## 2016-09-14 DIAGNOSIS — F329 Major depressive disorder, single episode, unspecified: Secondary | ICD-10-CM | POA: Diagnosis not present

## 2016-09-14 DIAGNOSIS — G4700 Insomnia, unspecified: Secondary | ICD-10-CM | POA: Diagnosis not present

## 2016-09-14 DIAGNOSIS — G309 Alzheimer's disease, unspecified: Secondary | ICD-10-CM | POA: Diagnosis not present

## 2016-09-21 DIAGNOSIS — Z0000 Encounter for general adult medical examination without abnormal findings: Secondary | ICD-10-CM | POA: Diagnosis not present

## 2016-09-21 DIAGNOSIS — G301 Alzheimer's disease with late onset: Secondary | ICD-10-CM | POA: Diagnosis not present

## 2016-09-21 DIAGNOSIS — F0280 Dementia in other diseases classified elsewhere without behavioral disturbance: Secondary | ICD-10-CM | POA: Diagnosis not present

## 2016-09-21 DIAGNOSIS — I5032 Chronic diastolic (congestive) heart failure: Secondary | ICD-10-CM | POA: Diagnosis not present

## 2016-09-21 DIAGNOSIS — F325 Major depressive disorder, single episode, in full remission: Secondary | ICD-10-CM | POA: Diagnosis not present

## 2016-10-06 ENCOUNTER — Other Ambulatory Visit
Admission: RE | Admit: 2016-10-06 | Discharge: 2016-10-06 | Disposition: A | Discharge status: Home / Self Care | Payer: Medicare Other | Source: Ambulatory Visit | Attending: Internal Medicine | Admitting: Internal Medicine

## 2016-10-06 DIAGNOSIS — N390 Urinary tract infection, site not specified: Secondary | ICD-10-CM | POA: Insufficient documentation

## 2016-10-06 LAB — URINALYSIS, COMPLETE (UACMP) WITH MICROSCOPIC
BACTERIA UA: NONE SEEN
BILIRUBIN URINE: NEGATIVE
Glucose, UA: NEGATIVE mg/dL
Hgb urine dipstick: NEGATIVE
Ketones, ur: NEGATIVE mg/dL
Leukocytes, UA: NEGATIVE
Nitrite: NEGATIVE
PROTEIN: NEGATIVE mg/dL
SPECIFIC GRAVITY, URINE: 1.01 (ref 1.005–1.030)
Squamous Epithelial / LPF: NONE SEEN
pH: 5 (ref 5.0–8.0)

## 2016-10-07 LAB — URINE CULTURE

## 2016-10-09 ENCOUNTER — Encounter
Admission: RE | Admit: 2016-10-09 | Discharge: 2016-10-09 | Disposition: A | Discharge status: Home / Self Care | Payer: Medicare Other | Source: Ambulatory Visit | Attending: Internal Medicine | Admitting: Internal Medicine

## 2016-10-14 ENCOUNTER — Non-Acute Institutional Stay: Payer: Medicare Other | Admitting: Gerontology

## 2016-10-14 ENCOUNTER — Encounter: Payer: Self-pay | Admitting: Gerontology

## 2016-10-14 DIAGNOSIS — G301 Alzheimer's disease with late onset: Secondary | ICD-10-CM

## 2016-10-14 DIAGNOSIS — M1990 Unspecified osteoarthritis, unspecified site: Secondary | ICD-10-CM | POA: Diagnosis not present

## 2016-10-14 DIAGNOSIS — H4010X0 Unspecified open-angle glaucoma, stage unspecified: Secondary | ICD-10-CM | POA: Diagnosis not present

## 2016-10-14 DIAGNOSIS — D649 Anemia, unspecified: Secondary | ICD-10-CM | POA: Diagnosis not present

## 2016-10-14 DIAGNOSIS — G4733 Obstructive sleep apnea (adult) (pediatric): Secondary | ICD-10-CM

## 2016-10-14 DIAGNOSIS — I10 Essential (primary) hypertension: Secondary | ICD-10-CM | POA: Diagnosis not present

## 2016-10-14 DIAGNOSIS — E559 Vitamin D deficiency, unspecified: Secondary | ICD-10-CM

## 2016-10-14 DIAGNOSIS — M47812 Spondylosis without myelopathy or radiculopathy, cervical region: Secondary | ICD-10-CM | POA: Diagnosis not present

## 2016-10-14 DIAGNOSIS — E538 Deficiency of other specified B group vitamins: Secondary | ICD-10-CM

## 2016-10-14 DIAGNOSIS — Z9181 History of falling: Secondary | ICD-10-CM

## 2016-10-14 DIAGNOSIS — E876 Hypokalemia: Secondary | ICD-10-CM

## 2016-10-14 DIAGNOSIS — R1312 Dysphagia, oropharyngeal phase: Secondary | ICD-10-CM

## 2016-10-14 DIAGNOSIS — F329 Major depressive disorder, single episode, unspecified: Secondary | ICD-10-CM

## 2016-10-14 DIAGNOSIS — N138 Other obstructive and reflux uropathy: Secondary | ICD-10-CM

## 2016-10-14 DIAGNOSIS — F39 Unspecified mood [affective] disorder: Secondary | ICD-10-CM | POA: Diagnosis not present

## 2016-10-14 DIAGNOSIS — G4700 Insomnia, unspecified: Secondary | ICD-10-CM

## 2016-10-14 DIAGNOSIS — N401 Enlarged prostate with lower urinary tract symptoms: Secondary | ICD-10-CM

## 2016-10-14 DIAGNOSIS — F0280 Dementia in other diseases classified elsewhere without behavioral disturbance: Secondary | ICD-10-CM | POA: Diagnosis not present

## 2016-10-14 DIAGNOSIS — K5900 Constipation, unspecified: Secondary | ICD-10-CM

## 2016-10-14 DIAGNOSIS — J309 Allergic rhinitis, unspecified: Secondary | ICD-10-CM

## 2016-10-14 DIAGNOSIS — M545 Low back pain: Secondary | ICD-10-CM

## 2016-10-14 NOTE — Progress Notes (Signed)
Location:   [facility] Room Number: [phone] Place of Service:  ALF [phone]) Provider:  Toni Arthurs, NP-C  Kirk Ruths, MD  Patient Care Team: Kirk Ruths, MD as PCP - General (Internal Medicine)  Extended Emergency Contact Information Primary Emergency Contact: [facility] Address: [address] [place] [address] [name] of [place] Phone: [phone] Work Phone: [phone] Mobile Phone: [phone] Relation: Son Secondary Emergency Contact: Nelia Shi Address: [address]          [place], [place] [address] [name] of [place] Phone: [phone] Work Phone: [phone] Mobile Phone: [phone] Relation: Daughter  Code Status:  DNR Goals of care: Advanced Directive information Advanced Directives 10/14/2016  Does Patient Have a Medical Advance Directive? Yes  Type of Advance Directive Out of facility DNR (pink MOST or yellow form)  Does patient want to make changes to medical advance directive? No - Patient declined  Copy of [place] in Chart? -     Chief Complaint  Patient presents with  . Medical Management of Chronic Issues    Routine Visit    HPI:  Pt is a 83 y.o. male seen today for medical management of chronic diseases. Pt has been stable this month. No falls this past month. Pt ambulates on unit with rolling walker. Does not like to participate in activities with others. He prefers to sit in his recliner and watch TV, but does occasionally eat in the dining room for meals. Incontinent of B&B. He has had a gradual mental decline over the past few months. Recently moved to the memory care unit. Friend reports he did not remember his ophthamologist name that he has been seeing for many years (who has the same name as him.)  Pt continues to typically eat most to all of each meal. Pt denies pain. Pt did have a recent diagnosis of UTI. On antibiotics without adverse  effects. Denies n/v/d/f/c/cp/sob/ha/abd pain/dizziness. Denies CV tenderness. VSS. No other complaints.    Past Medical History:  Diagnosis Date  . Anemia   . Benign prostatic hyperplasia   . BPH without obstruction/lower urinary tract symptoms 03/27/2012  . Cervical vertebral fracture (HCC)    C7 with small subdural hematoma  . Dementia   . Depression 02/05/2015   unspecified  . Dysphagia   . Facial fracture ([place])   . Falling   . Glaucoma   . Hypertension   . Intracranial injury ([place])   . Macular degeneration    early/dry  . Malignant neoplasm of prostate ([place]) 05/22/2012  . Osteoarthritis   . POAG (primary open-angle glaucoma)   . Sleep apnea 02/04/2015  . Spondylosis of cervical joint   . Subdural hematoma ([place])   . Traumatic brain injury ([place])   . Urge incontinence 03/27/2012   Past Surgical History:  Procedure Laterality Date  . APPENDECTOMY    . AQUEOUS SHUNT Right 03/17/2015   Procedure:  . CHOLECYSTECTOMY     x 2  . GALLBLADDER SURGERY    . GLAUCOMA SURGERY Right 12/2006  . HERNIA REPAIR    . LENS SURGERY Right 12/29/2010   With i-stent  . LENS SURGERY Left 02/28/2011   With i-stent  . TONSILLECTOMY    . TURP VAPORIZATION      No Known Allergies  Allergies as of 10/14/2016   No Known Allergies     Medication List  Accurate as of 10/14/16  9:20 AM. Always use your most recent med list.          amLODipine 5 MG tablet Commonly known as:  NORVASC Take 5 mg by mouth daily.   ASPERCREME W/LIDOCAINE 4 % cream Generic drug:  lidocaine Apply 1 application topically 3 (three) times daily as needed. Apply thin film to lumbo-sacral area for pain   aspirin EC 81 MG tablet Take 81 mg by mouth daily.   ASTEPRO 0.15 % Soln Generic drug:  Azelastine HCl Place 2 sprays into both nostrils at bedtime.   brimonidine 0.2 % ophthalmic solution Commonly known as:  ALPHAGAN Place 1 drop into the left eye 2 (two) times daily. Per Verneita Griffes, MD.  Wait 5 minutes between admin of multiple eye drops   carvedilol 6.25 MG tablet Commonly known as:  COREG Take 6.25 mg by mouth every 12 (twelve) hours.   cholecalciferol 1000 units tablet Commonly known as:  VITAMIN D Take 2,000 Units by mouth daily.   citalopram 20 MG tablet Commonly known as:  CELEXA Take 20 mg by mouth daily.   coal tar 0.5 % shampoo Commonly known as:  NEUTROGENA T-GEL Apply 1 application topically once a week. On thursdays   dorzolamide-timolol 22.3-6.8 MG/ML ophthalmic solution Commonly known as:  COSOPT Place 1 drop into both eyes 2 (two) times daily. Per Dr. Arlis Porta. Wait 5 mins between admin of multiple eye drops   ENDIT EX Apply liberal amount topically to areas of skin irritation as needed. Ok to leave at bedside.   folic acid 229 MCG tablet Commonly known as:  FOLVITE Take 400 mcg by mouth daily.   loperamide 2 MG capsule Commonly known as:  IMODIUM Give 4 mg by mouth prn for loose stools and 2 mg after each subsequent loose stool up to 8 doses in 24 hours   magnesium hydroxide 400 MG/5ML suspension Commonly known as:  MILK OF MAGNESIA Take 30 mLs by mouth daily as needed for mild constipation. For constipation no results in 24 hours may administer bisacodyl supp   MAPAP ARTHRITIS PAIN 650 MG CR tablet Generic drug:  acetaminophen Take 1,300 mg by mouth 2 (two) times daily.   memantine 5 MG tablet Commonly known as:  NAMENDA Take 5 mg by mouth 2 (two) times daily.   mometasone 50 MCG/ACT nasal spray Commonly known as:  NASONEX Place 2 sprays into the nose every morning.   potassium chloride 10 MEQ CR capsule Commonly known as:  MICRO-K Take 10 mEq by mouth 2 (two) times daily.   prednisoLONE acetate 1 % ophthalmic suspension Commonly known as:  PRED FORTE Place 1 drop into the left eye 4 (four) times daily. Per Dr. Arlis Porta. Wait 5 minutes between drops, Shake before giving drops   sennosides-docusate sodium 8.6-50 MG  tablet Commonly known as:  SENOKOT-S Take 1 tablet by mouth at bedtime.   tamsulosin 0.4 MG Caps capsule Commonly known as:  FLOMAX Take 0.4 mg by mouth daily.   traMADol 50 MG tablet Commonly known as:  ULTRAM Take 50-100 mg by mouth every 4 (four) hours as needed for moderate pain or severe pain.   traZODone 50 MG tablet Commonly known as:  DESYREL Take 50 mg by mouth at bedtime as needed for sleep. Ok to hold if pt is sleeping   trimethoprim-polymyxin b ophthalmic solution Commonly known as:  POLYTRIM Place 1 drop into the left eye 4 (four) times daily. Per Dr. Arlis Porta  vitamin B-12 1000 MCG tablet Commonly known as:  CYANOCOBALAMIN Take 1,000 mcg by mouth daily.       Review of Systems  Unable to perform ROS: Dementia  Constitutional: Negative for activity change, appetite change, chills, diaphoresis and fever.  HENT: Negative for congestion, sneezing, sore throat, trouble swallowing and voice change.   Eyes: Negative.   Respiratory: Negative for apnea, cough, choking, chest tightness, shortness of breath and wheezing.   Cardiovascular: Negative for chest pain.  Gastrointestinal: Negative.   Genitourinary: Negative.   Musculoskeletal: Positive for arthralgias (typical arthritis). Negative for back pain.  Skin: Positive for wound.  Neurological: Negative for dizziness, tremors, syncope, speech difficulty, weakness, numbness and headaches.  Psychiatric/Behavioral: Negative.   All other systems reviewed and are negative.   Immunization History  Administered Date(s) Administered  . Influenza-Unspecified 11/14/2015  . PPD Test 03/05/2016   Pertinent  Health Maintenance Due  Topic Date Due  . PNA vac Low Risk Adult (1 of 2 - PCV13) 02/23/1992  . INFLUENZA VACCINE  11/08/2016 (Originally 09/08/2016)   No flowsheet data found. Functional Status Survey:    Vitals:   10/14/16 0913  BP: (!) 166/75  Pulse: 80  Resp: 20  Temp: 97.6 F (36.4 C)  SpO2: 96%   Weight: 161 lb 14.4 oz (73.4 kg)  Height: _0  (1.651 m)   Body mass index is 26.94 kg/m. Physical Exam  Constitutional: He is oriented to person, place, and time. Vital signs are normal. He appears well-developed and well-nourished. He is active and cooperative. He does not appear ill. No distress.  HENT:  Head: Normocephalic and atraumatic.  Mouth/Throat: Uvula is midline, oropharynx is clear and moist and mucous membranes are normal. Mucous membranes are not pale, not dry and not cyanotic.  Eyes: Pupils are equal, round, and reactive to light. Conjunctivae, EOM and lids are normal.  Neck: Trachea normal, normal range of motion and full passive range of motion without pain. Neck supple. No JVD present. No tracheal deviation, no edema and no erythema present. No thyromegaly present.  Cardiovascular: Normal rate, regular rhythm, normal heart sounds, intact distal pulses and normal pulses.  Exam reveals no gallop, no distant heart sounds and no friction rub.   No murmur heard. Pulmonary/Chest: Effort normal. No accessory muscle usage. No respiratory distress. He has no decreased breath sounds. He has no wheezes. He has no rhonchi. He has no rales. He exhibits no tenderness.  Abdominal: Soft. Normal appearance and bowel sounds are normal. He exhibits no distension and no ascites. There is no tenderness.  Musculoskeletal: He exhibits no edema or tenderness.       Lumbar back: He exhibits decreased range of motion and pain.  Expected osteoarthritis, stiffness; calves soft, supple. Negative Homan's sign. Increased difficulty walking d/t back pain  Neurological: He is alert and oriented to person, place, and time. He has normal strength.  Skin: Skin is warm and dry. Laceration noted. He is not diaphoretic. No cyanosis. No pallor. Nails show no clubbing.  Psychiatric: He has a normal mood and affect. His speech is normal and behavior is normal. Judgment and thought content normal. Cognition and  memory are impaired. He exhibits abnormal recent memory.  Nursing note and vitals reviewed.   Labs reviewed:  Recent Labs  01/29/16 1220 04/26/16 0721  NA 141 137  K 3.8 3.8  CL 112* 105  CO2 22 26  GLUCOSE 96 118*  BUN 21* 19  CREATININE 1.06 0.71  CALCIUM 9.1 8.6*  MG 2.5*  --     Recent Labs  01/29/16 1220  AST 25  ALT 27  ALKPHOS 68  BILITOT 0.3  PROT 7.5  ALBUMIN 4.0    Recent Labs  01/29/16 1220 04/26/16 0721  WBC 7.8 10.9*  NEUTROABS 5.4  --   HGB 13.8 12.8*  HCT 41.2 37.7*  MCV 90.5 91.2  PLT 237 242   Lab Results  Component Value Date   TSH 1.535 01/29/2016   No results found for: HGBA1C No results found for: CHOL, HDL, LDLCALC, LDLDIRECT, TRIG, CHOLHDL  Significant Diagnostic Results in last 30 days:  No results found.  Assessment/Plan 1. Late onset Alzheimer's disease without behavioral disturbance 2. Dementia in other diseases classified elsewhere without behavioral disturbance  Stable  Continue Namenda 5 mg po BID  3. Oropharyngeal dysphagia  Stable   4. Spondylosis without myelopathy or radiculopathy, cervical region  Stable  See osteoarthritis   5. Osteoarthritis, unspecified osteoarthritis type, unspecified site  Stable  Continue Mapap Arthritis Pain (acetaminophen) [OTC] tablet extended release; 650 mg; amt: 1321m=2tabs PO BID  6. Essential (primary) hypertension  Stable  Continue Amlodipine 5 mg po Q Day  Continue ASA 81 mg po Q Day  Continue Carvedilol 6.25 mg po BID  7. Open-angle glaucoma, unspecified glaucoma stage, unspecified laterality, unspecified open-angle glaucoma type  Stable  Continue brimonidine drops; 0.2 %- 1 drop in the left eye BID  Continue Cosopt (dorzolamide-timolol) drops; 22.3-6.8 mg/m- 1 drop both eyes BID   Continue Lumigan (bimatoprost) drops; 0.01 %; 1 drop in left eye Q HS  8. Mood disorder ([place] 9. Major depressive disorder with single episode, remission status  unspecified  Stable  Continue Celexa 20 mg po Q Day  10. Obstructive sleep apnea (adult) (pediatric)  stable  11. Anemia, unspecified type  Stable   12. Benign prostatic hyperplasia with urinary obstruction  Stable  Continue tamsulosin capsule,extended release 24hr; 0.4 mg; PO Q Day  13. Hx of falling  Stable   14. Low back pain, unspecified back pain laterality, unspecified chronicity, with sciatica presence unspecified  Stable  Continue Aspercreme 4%- thin film TID prn  Continue naproxen sodium [OTC] tablet; 220 mg; amt: 1 tablet po BID  Continue Tramadol 50 mg 1-2 tablets po Q 4 hours prn  15. Allergic rhinitis, unspecified seasonality, unspecified trigger  Stable  Continue Astepro 0.15% 2 sprays in each nostril Q HS  Continue Nasonex (mometasone) spray,non-aerosol; 50 mcg/actuation; amt: 2sprays into each nostril Q AM  16. Vitamin D deficiency  Stable  Continue Cholecalciferol 2,000 units po Q Day  17. Vitamin B12 deficiency  Stable  Continue Cyanocobalamin 1,000 mcg po Q Day  Continue Folic Acid 4993mcg po Q Day  18. Insomnia, unspecified type  Stable  Continue Trazodone 50 mg po Q HS  19. Hypokalemia  Stable  Continue potassium chloride capsule, extended release; 10 mEq po BID  20. Constipation  Stable   Continue Senna S 1 tablet po Q HS  Family/ staff Communication:   Total Time:  Documentation:  Face to Face:  Family/Phone:   Labs/tests ordered:  Cbc, met c, mag+ tsh, B12, D  Medication list reviewed and assessed for continued appropriateness. Monthly medication orders reviewed and signed.  SVikki Ports NP-C [facility] [phone]. [place]  [phone] Phone (Mon-Fri 8am-5pm):  [phone] Call:  [phone]& follow prompts after 5pm & weekends Office Phone:  [phone] Fax:  [phone]

## 2016-10-28 ENCOUNTER — Other Ambulatory Visit
Admission: RE | Admit: 2016-10-28 | Discharge: 2016-10-28 | Disposition: A | Discharge status: Home / Self Care | Payer: Medicare Other | Source: Ambulatory Visit | Attending: Gerontology | Admitting: Gerontology

## 2016-10-28 DIAGNOSIS — F0280 Dementia in other diseases classified elsewhere without behavioral disturbance: Secondary | ICD-10-CM | POA: Insufficient documentation

## 2016-10-28 LAB — COMPREHENSIVE METABOLIC PANEL
ALBUMIN: 3.3 g/dL — AB (ref 3.5–5.0)
ALT: 14 U/L — ABNORMAL LOW (ref 17–63)
AST: 15 U/L (ref 15–41)
Alkaline Phosphatase: 47 U/L (ref 38–126)
Anion gap: 6 (ref 5–15)
BILIRUBIN TOTAL: 0.6 mg/dL (ref 0.3–1.2)
BUN: 21 mg/dL — AB (ref 6–20)
CO2: 24 mmol/L (ref 22–32)
Calcium: 8.5 mg/dL — ABNORMAL LOW (ref 8.9–10.3)
Chloride: 109 mmol/L (ref 101–111)
Creatinine, Ser: 0.9 mg/dL (ref 0.61–1.24)
GFR calc Af Amer: >60 mL/min (ref >60)
GFR calc non Af Amer: >60 mL/min (ref >60)
GLUCOSE: 105 mg/dL — AB (ref 65–99)
POTASSIUM: 3.6 mmol/L (ref 3.5–5.1)
Sodium: 139 mmol/L (ref 135–145)
TOTAL PROTEIN: 6.3 g/dL — AB (ref 6.5–8.1)

## 2016-10-28 LAB — CBC WITH DIFFERENTIAL/PLATELET
Basophils Absolute: 0 10*3/uL (ref 0–0.1)
Basophils Relative: 0 %
Eosinophils Absolute: 0.3 10*3/uL (ref 0–0.7)
Eosinophils Relative: 4 %
HEMATOCRIT: 35 % — AB (ref 40.0–52.0)
Hemoglobin: 12 g/dL — ABNORMAL LOW (ref 13.0–18.0)
LYMPHS PCT: 25 %
Lymphs Abs: 1.6 10*3/uL (ref 1.0–3.6)
MCH: 31.3 pg (ref 26.0–34.0)
MCHC: 34.2 g/dL (ref 32.0–36.0)
MCV: 91.7 fL (ref 80.0–100.0)
MONO ABS: 0.6 10*3/uL (ref 0.2–1.0)
MONOS PCT: 9 %
NEUTROS ABS: 4 10*3/uL (ref 1.4–6.5)
NEUTROS PCT: 62 %
Platelets: 215 10*3/uL (ref 150–440)
RBC: 3.81 MIL/uL — ABNORMAL LOW (ref 4.40–5.90)
RDW: 14.2 % (ref 11.5–14.5)
WBC: 6.5 10*3/uL (ref 3.8–10.6)

## 2016-10-28 LAB — MAGNESIUM: Magnesium: 2.2 mg/dL (ref 1.7–2.4)

## 2016-10-28 LAB — TSH: TSH: 1.698 u[IU]/mL (ref 0.350–4.500)

## 2016-10-28 LAB — VITAMIN B12: Vitamin B-12: 1099 pg/mL — ABNORMAL HIGH (ref 180–914)

## 2016-10-29 LAB — VITAMIN D 25 HYDROXY (VIT D DEFICIENCY, FRACTURES): Vit D, 25-Hydroxy: 28.1 ng/mL — ABNORMAL LOW (ref 30.0–100.0)

## 2016-11-01 ENCOUNTER — Other Ambulatory Visit
Admission: RE | Admit: 2016-11-01 | Discharge: 2016-11-01 | Disposition: A | Discharge status: Home / Self Care | Payer: Medicare Other | Source: Ambulatory Visit | Attending: Internal Medicine | Admitting: Internal Medicine

## 2016-11-01 DIAGNOSIS — R410 Disorientation, unspecified: Secondary | ICD-10-CM | POA: Insufficient documentation

## 2016-11-01 LAB — URINALYSIS, COMPLETE (UACMP) WITH MICROSCOPIC
BACTERIA UA: NONE SEEN
Bilirubin Urine: NEGATIVE
Glucose, UA: NEGATIVE mg/dL
HGB URINE DIPSTICK: NEGATIVE
Ketones, ur: NEGATIVE mg/dL
Leukocytes, UA: NEGATIVE
NITRITE: NEGATIVE
PROTEIN: 30 mg/dL — AB
SPECIFIC GRAVITY, URINE: 1.019 (ref 1.005–1.030)
pH: 5 (ref 5.0–8.0)

## 2016-11-02 LAB — URINE CULTURE

## 2016-11-05 DIAGNOSIS — M47812 Spondylosis without myelopathy or radiculopathy, cervical region: Secondary | ICD-10-CM | POA: Diagnosis not present

## 2016-11-05 DIAGNOSIS — G301 Alzheimer's disease with late onset: Secondary | ICD-10-CM | POA: Diagnosis not present

## 2016-11-05 DIAGNOSIS — R1312 Dysphagia, oropharyngeal phase: Secondary | ICD-10-CM | POA: Diagnosis not present

## 2016-11-08 ENCOUNTER — Encounter
Admission: RE | Admit: 2016-11-08 | Discharge: 2016-11-08 | Disposition: A | Discharge status: Home / Self Care | Payer: Medicare Other | Source: Ambulatory Visit | Attending: Internal Medicine | Admitting: Internal Medicine

## 2016-11-08 DIAGNOSIS — R1312 Dysphagia, oropharyngeal phase: Secondary | ICD-10-CM | POA: Diagnosis not present

## 2016-11-08 DIAGNOSIS — G301 Alzheimer's disease with late onset: Secondary | ICD-10-CM | POA: Diagnosis not present

## 2016-11-08 DIAGNOSIS — M47812 Spondylosis without myelopathy or radiculopathy, cervical region: Secondary | ICD-10-CM | POA: Diagnosis not present

## 2016-11-08 DIAGNOSIS — Z9181 History of falling: Secondary | ICD-10-CM | POA: Diagnosis not present

## 2016-11-08 DIAGNOSIS — M6281 Muscle weakness (generalized): Secondary | ICD-10-CM | POA: Diagnosis not present

## 2016-11-08 DIAGNOSIS — R2689 Other abnormalities of gait and mobility: Secondary | ICD-10-CM | POA: Diagnosis not present

## 2016-11-10 DIAGNOSIS — R1312 Dysphagia, oropharyngeal phase: Secondary | ICD-10-CM | POA: Diagnosis not present

## 2016-11-10 DIAGNOSIS — Z9181 History of falling: Secondary | ICD-10-CM | POA: Diagnosis not present

## 2016-11-10 DIAGNOSIS — G301 Alzheimer's disease with late onset: Secondary | ICD-10-CM | POA: Diagnosis not present

## 2016-11-10 DIAGNOSIS — R2689 Other abnormalities of gait and mobility: Secondary | ICD-10-CM | POA: Diagnosis not present

## 2016-11-10 DIAGNOSIS — M6281 Muscle weakness (generalized): Secondary | ICD-10-CM | POA: Diagnosis not present

## 2016-11-10 DIAGNOSIS — M47812 Spondylosis without myelopathy or radiculopathy, cervical region: Secondary | ICD-10-CM | POA: Diagnosis not present

## 2016-11-11 ENCOUNTER — Encounter: Payer: Self-pay | Admitting: Gerontology

## 2016-11-11 ENCOUNTER — Non-Acute Institutional Stay: Payer: Medicare Other | Admitting: Gerontology

## 2016-11-11 DIAGNOSIS — M47812 Spondylosis without myelopathy or radiculopathy, cervical region: Secondary | ICD-10-CM

## 2016-11-11 DIAGNOSIS — E538 Deficiency of other specified B group vitamins: Secondary | ICD-10-CM

## 2016-11-11 DIAGNOSIS — N401 Enlarged prostate with lower urinary tract symptoms: Secondary | ICD-10-CM

## 2016-11-11 DIAGNOSIS — J309 Allergic rhinitis, unspecified: Secondary | ICD-10-CM

## 2016-11-11 DIAGNOSIS — D649 Anemia, unspecified: Secondary | ICD-10-CM | POA: Diagnosis not present

## 2016-11-11 DIAGNOSIS — H4010X0 Unspecified open-angle glaucoma, stage unspecified: Secondary | ICD-10-CM | POA: Diagnosis not present

## 2016-11-11 DIAGNOSIS — R1312 Dysphagia, oropharyngeal phase: Secondary | ICD-10-CM | POA: Diagnosis not present

## 2016-11-11 DIAGNOSIS — G4733 Obstructive sleep apnea (adult) (pediatric): Secondary | ICD-10-CM | POA: Diagnosis not present

## 2016-11-11 DIAGNOSIS — M1990 Unspecified osteoarthritis, unspecified site: Secondary | ICD-10-CM | POA: Diagnosis not present

## 2016-11-11 DIAGNOSIS — Z9181 History of falling: Secondary | ICD-10-CM

## 2016-11-11 DIAGNOSIS — F329 Major depressive disorder, single episode, unspecified: Secondary | ICD-10-CM | POA: Diagnosis not present

## 2016-11-11 DIAGNOSIS — K5900 Constipation, unspecified: Secondary | ICD-10-CM

## 2016-11-11 DIAGNOSIS — F0280 Dementia in other diseases classified elsewhere without behavioral disturbance: Secondary | ICD-10-CM | POA: Diagnosis not present

## 2016-11-11 DIAGNOSIS — G301 Alzheimer's disease with late onset: Secondary | ICD-10-CM | POA: Diagnosis not present

## 2016-11-11 DIAGNOSIS — F39 Unspecified mood [affective] disorder: Secondary | ICD-10-CM

## 2016-11-11 DIAGNOSIS — M545 Low back pain: Secondary | ICD-10-CM

## 2016-11-11 DIAGNOSIS — G4700 Insomnia, unspecified: Secondary | ICD-10-CM

## 2016-11-11 DIAGNOSIS — N138 Other obstructive and reflux uropathy: Secondary | ICD-10-CM

## 2016-11-11 DIAGNOSIS — E876 Hypokalemia: Secondary | ICD-10-CM

## 2016-11-11 DIAGNOSIS — I10 Essential (primary) hypertension: Secondary | ICD-10-CM | POA: Diagnosis not present

## 2016-11-11 DIAGNOSIS — E559 Vitamin D deficiency, unspecified: Secondary | ICD-10-CM

## 2016-11-11 NOTE — Progress Notes (Signed)
Location:   [facility] Room Number: [phone] Place of Service:  ALF [phone]) Provider:  Toni Arthurs, NP-C  Kirk Ruths, MD  Patient Care Team: Kirk Ruths, MD as PCP - General (Internal Medicine)  Extended Emergency Contact Information Primary Emergency Contact: [facility] Address: [address] [place] [address] [name] of [place] Phone: [phone] Work Phone: [phone] Mobile Phone: [phone] Relation: Son Secondary Emergency Contact: Nelia Shi Address: [address]          [place], [place] [address] [name] of [place] Phone: [phone] Work Phone: [phone] Mobile Phone: [phone] Relation: Daughter  Code Status:  DNR Goals of care: Advanced Directive information Advanced Directives 11/11/2016  Does Patient Have a Medical Advance Directive? Yes  Type of Advance Directive Out of facility DNR (pink MOST or yellow form)  Does patient want to make changes to medical advance directive? No - Patient declined  Copy of [place] in Chart? -     Chief Complaint  Patient presents with  . Medical Management of Chronic Issues    Routine Visit    HPI:  Pt is a 83 y.o. male seen today for medical management of chronic diseases.  Patient has been stable this month.  No falls this past month.  Patient was rolled walker.  Does not like participating in activities with others.  He prefers to sit in his recliner and watch TV, but does occasionally eat in the dining room for meals.  Incontinent of bowel and bladder.  He has had gradual mental decline over the past few months.  Now resides on the memory care unit.  Patient continues to typically eat most all of each meal.  The patient denies pain.  Vital signs stable.  No other complaints.   Past Medical History:  Diagnosis Date  . Anemia   . Benign prostatic hyperplasia   . BPH without obstruction/lower urinary tract  symptoms 03/27/2012  . Cervical vertebral fracture (HCC)    C7 with small subdural hematoma  . Dementia   . Depression 02/05/2015   unspecified  . Dysphagia   . Facial fracture ([place])   . Falling   . Glaucoma   . Hypertension   . Intracranial injury ([place])   . Macular degeneration    early/dry  . Malignant neoplasm of prostate ([place]) 05/22/2012  . Osteoarthritis   . POAG (primary open-angle glaucoma)   . Sleep apnea 02/04/2015  . Spondylosis of cervical joint   . Subdural hematoma ([place])   . Traumatic brain injury ([place])   . Urge incontinence 03/27/2012   Past Surgical History:  Procedure Laterality Date  . APPENDECTOMY    . AQUEOUS SHUNT Right 03/17/2015   Procedure:  . CHOLECYSTECTOMY     x 2  . GALLBLADDER SURGERY    . GLAUCOMA SURGERY Right 12/2006  . HERNIA REPAIR    . LENS SURGERY Right 12/29/2010   With i-stent  . LENS SURGERY Left 02/28/2011   With i-stent  . TONSILLECTOMY    . TURP VAPORIZATION      No Known Allergies  Allergies as of 11/11/2016   No Known Allergies     Medication List       Accurate as of 11/11/16 10:38 AM. Always use your most recent med list.          amLODipine 5 MG tablet Commonly known as:  NORVASC Take 5 mg  by mouth daily.   ASPERCREME W/LIDOCAINE 4 % cream Generic drug:  lidocaine Apply 1 application topically 3 (three) times daily as needed. Apply thin film to lumbo-sacral area for pain   aspirin EC 81 MG tablet Take 81 mg by mouth daily.   ASTEPRO 0.15 % Soln Generic drug:  Azelastine HCl Place 2 sprays into both nostrils at bedtime.   carvedilol 6.25 MG tablet Commonly known as:  COREG Take 6.25 mg by mouth every 12 (twelve) hours.   citalopram 20 MG tablet Commonly known as:  CELEXA Take 20 mg by mouth daily.   coal tar 0.5 % shampoo Commonly known as:  NEUTROGENA T-GEL Apply 1 application topically once a week. On thursdays   dorzolamide-timolol 22.3-6.8 MG/ML ophthalmic solution Commonly known as:   COSOPT Place 1 drop into both eyes 2 (two) times daily. Per Dr. Arlis Porta. Wait 5 mins between admin of multiple eye drops   ENDIT EX Apply liberal amount topically to areas of skin irritation as needed. Ok to leave at bedside.   folic acid 656 MCG tablet Commonly known as:  FOLVITE Take 400 mcg by mouth daily.   loperamide 2 MG capsule Commonly known as:  IMODIUM Give 4 mg by mouth prn for loose stools and 2 mg after each subsequent loose stool up to 8 doses in 24 hours   magnesium hydroxide 400 MG/5ML suspension Commonly known as:  MILK OF MAGNESIA Take 30 mLs by mouth daily as needed for mild constipation. For constipation no results in 24 hours may administer bisacodyl supp   MAPAP ARTHRITIS PAIN 650 MG CR tablet Generic drug:  acetaminophen Take 1,300 mg by mouth 2 (two) times daily.   memantine 5 MG tablet Commonly known as:  NAMENDA Take 5 mg by mouth 2 (two) times daily.   mometasone 50 MCG/ACT nasal spray Commonly known as:  NASONEX Place 2 sprays into the nose every morning.   potassium chloride 10 MEQ CR capsule Commonly known as:  MICRO-K Take 10 mEq by mouth 2 (two) times daily.   sennosides-docusate sodium 8.6-50 MG tablet Commonly known as:  SENOKOT-S Take 1 tablet by mouth at bedtime.   tamsulosin 0.4 MG Caps capsule Commonly known as:  FLOMAX Take 0.4 mg by mouth daily.   traMADol 50 MG tablet Commonly known as:  ULTRAM Take 50-100 mg by mouth every 4 (four) hours as needed for moderate pain or severe pain.   traZODone 50 MG tablet Commonly known as:  DESYREL Take 50 mg by mouth at bedtime as needed for sleep. Ok to hold if pt is sleeping   vitamin B-12 1000 MCG tablet Commonly known as:  CYANOCOBALAMIN Take 1,000 mcg by mouth daily.   Vitamin D3 2000 units capsule Take 2,000 Units by mouth daily.       Review of Systems  Unable to perform ROS: Dementia  Constitutional: Negative for activity change, appetite change, chills, diaphoresis and  fever.  HENT: Negative for congestion, sneezing, sore throat, trouble swallowing and voice change.   Eyes: Negative.   Respiratory: Negative for apnea, cough, choking, chest tightness, shortness of breath and wheezing.   Cardiovascular: Negative for chest pain.  Gastrointestinal: Negative.   Genitourinary: Negative.   Musculoskeletal: Positive for arthralgias (typical arthritis). Negative for back pain.  Neurological: Negative for dizziness, tremors, syncope, speech difficulty, weakness, numbness and headaches.  Psychiatric/Behavioral: Negative.   All other systems reviewed and are negative.   Immunization History  Administered Date(s) Administered  . Influenza-Unspecified 11/14/2015, 11/02/2016  .  PPD Test 03/05/2016   Pertinent  Health Maintenance Due  Topic Date Due  . PNA vac Low Risk Adult (1 of 2 - PCV13) 02/23/1992  . INFLUENZA VACCINE  Completed   No flowsheet data found. Functional Status Survey:    Vitals:   11/11/16 1032  BP: (!) 146/74  Pulse: 60  Resp: 20  Temp: 98.2 F (36.8 C)  SpO2: 97%  Weight: 161 lb 1.6 oz (73.1 kg)  Height: 5\' 5"  (1.651 m)   Body mass index is 26.81 kg/m. Physical Exam  Constitutional: He is oriented to person, place, and time. Vital signs are normal. He appears well-developed and well-nourished. He is active and cooperative. He does not appear ill. No distress.  HENT:  Head: Normocephalic and atraumatic.  Mouth/Throat: Uvula is midline, oropharynx is clear and moist and mucous membranes are normal. Mucous membranes are not pale, not dry and not cyanotic.  Eyes: Pupils are equal, round, and reactive to light. Conjunctivae, EOM and lids are normal.  Neck: Trachea normal, normal range of motion and full passive range of motion without pain. Neck supple. No JVD present. No tracheal deviation, no edema and no erythema present. No thyromegaly present.  Cardiovascular: Normal rate, regular rhythm, normal heart sounds, intact distal pulses  and normal pulses.  Exam reveals no gallop, no distant heart sounds and no friction rub.   No murmur heard. Pulses:      Dorsalis pedis pulses are 2+ on the right side, and 2+ on the left side.  No edema  Pulmonary/Chest: Effort normal. No accessory muscle usage. No respiratory distress. He has no decreased breath sounds. He has no wheezes. He has no rhonchi. He has no rales. He exhibits no tenderness.  Abdominal: Soft. Normal appearance and bowel sounds are normal. He exhibits no distension and no ascites. There is no tenderness.  Musculoskeletal: He exhibits no edema or tenderness.       Lumbar back: He exhibits decreased range of motion and pain (at times).  Expected osteoarthritis, stiffness; calves soft, supple. Negative Homan's sign. Increased difficulty walking d/t back pain  Neurological: He is alert and oriented to person, place, and time. He has normal strength. Coordination and gait abnormal.  Skin: Skin is warm and dry. He is not diaphoretic. No cyanosis. No pallor. Nails show no clubbing.  Psychiatric: He has a normal mood and affect. His speech is normal and behavior is normal. Judgment and thought content normal. Cognition and memory are impaired. He exhibits abnormal recent memory.  Nursing note and vitals reviewed.   Labs reviewed:  Recent Labs  01/29/16 1220 04/26/16 0721 10/28/16 0510  NA 141 137 139  K 3.8 3.8 3.6  CL 112* 105 109  CO2 22 26 24   GLUCOSE 96 118* 105*  BUN 21* 19 21*  CREATININE 1.06 0.71 0.90  CALCIUM 9.1 8.6* 8.5*  MG 2.5*  --  2.2    Recent Labs  01/29/16 1220 10/28/16 0510  AST 25 15  ALT 27 14*  ALKPHOS 68 47  BILITOT 0.3 0.6  PROT 7.5 6.3*  ALBUMIN 4.0 3.3*    Recent Labs  01/29/16 1220 04/26/16 0721 10/28/16 0510  WBC 7.8 10.9* 6.5  NEUTROABS 5.4  --  4.0  HGB 13.8 12.8* 12.0*  HCT 41.2 37.7* 35.0*  MCV 90.5 91.2 91.7  PLT 237 242 215   Lab Results  Component Value Date   TSH 1.698 10/28/2016   No results found  for: HGBA1C No results found for:  CHOL, HDL, LDLCALC, LDLDIRECT, TRIG, CHOLHDL  Significant Diagnostic Results in last 30 days:  No results found.  Assessment/Plan 1. Late onset Alzheimer's disease without behavioral disturbance 2. Dementia in other diseases classified elsewhere without behavioral disturbance  Stable  Continue Namenda 5 mg po BID  3. Oropharyngeal dysphagia  Stable   4. Spondylosis without myelopathy or radiculopathy, cervical region  Stable  See osteoarthritis   5. Osteoarthritis, unspecified osteoarthritis type, unspecified site  Stable  Continue Mapap Arthritis Pain (acetaminophen) [OTC] tablet extended release; 650 mg; amt: 1300mg =2tabs PO BID  6. Essential (primary) hypertension  Stable  Continue Amlodipine 5 mg po Q Day  Continue ASA 81 mg po Q Day  Continue Carvedilol 6.25 mg po BID  7. Open-angle glaucoma, unspecified glaucoma stage, unspecified laterality, unspecified open-angle glaucoma type  Stable  Discontinue brimonidine drops; 0.2 %- 1 drop in the left eye BID  Continue Cosopt (dorzolamide-timolol) drops; 22.3-6.8 mg/m- 1 drop both eyes BID   Discontinue Lumigan (bimatoprost) drops; 0.01 %; 1 drop in left eye Q HS  8. Mood disorder ([place]) 9. Major depressive disorder with single episode, remission status unspecified  Stable  Continue Celexa 20 mg po Q Day  10. Obstructive sleep apnea (adult) (pediatric)  stable  11. Anemia, unspecified type  Stable   12. Benign prostatic hyperplasia with urinary obstruction  Stable  Continue tamsulosin capsule,extended release 24hr; 0.4 mg; PO Q Day  13. Hx of falling  Stable   14. Low back pain, unspecified back pain laterality, unspecified chronicity, with sciatica presence unspecified  Stable  Continue Aspercreme 4%- thin film TID prn  Continue naproxen sodium [OTC] tablet; 220 mg; amt: 1 tablet po BID  Continue Tramadol 50 mg 1-2 tablets po Q 4 hours prn  15.  Allergic rhinitis, unspecified seasonality, unspecified trigger  Stable  Continue Astepro 0.15% 2 sprays in each nostril Q HS  Continue Nasonex (mometasone) spray,non-aerosol; 50 mcg/actuation; amt: 2sprays into each nostril Q AM  16. Vitamin D deficiency  Stable  Continue Cholecalciferol 2,000 units po Q Day  17. Vitamin B12 deficiency  Stable  Continue Cyanocobalamin 1,000 mcg po Q Day  Continue Folic Acid 174 mcg po Q Day  18. Insomnia, unspecified type  Stable  Continue Trazodone 50 mg po Q HS  19. Hypokalemia  Stable  Continue potassium chloride capsule, extended release; 10 mEq po BID  20. Constipation  Stable   Continue Senna S 1 tablet po Q HS  Continue all medications as listed above unless otherwise noted  Family/ staff Communication:   Total Time:  Documentation:  Face to Face:  Family/Phone:   Labs/tests ordered: Not due  Medication list reviewed and assessed for continued appropriateness. Monthly medication orders reviewed and signed.  Vikki Ports, NP-C Geriatrics [facility] [phone], [place] [address] Cell Phone (Mon-Fri 8am-5pm):  [phone] On Call:  [phone] & follow prompts after 5pm & weekends Office Phone:  [phone] Office Fax:  [phone]

## 2016-11-15 DIAGNOSIS — M6281 Muscle weakness (generalized): Secondary | ICD-10-CM | POA: Diagnosis not present

## 2016-11-15 DIAGNOSIS — M47812 Spondylosis without myelopathy or radiculopathy, cervical region: Secondary | ICD-10-CM | POA: Diagnosis not present

## 2016-11-15 DIAGNOSIS — R1312 Dysphagia, oropharyngeal phase: Secondary | ICD-10-CM | POA: Diagnosis not present

## 2016-11-15 DIAGNOSIS — R2689 Other abnormalities of gait and mobility: Secondary | ICD-10-CM | POA: Diagnosis not present

## 2016-11-15 DIAGNOSIS — Z9181 History of falling: Secondary | ICD-10-CM | POA: Diagnosis not present

## 2016-11-15 DIAGNOSIS — G301 Alzheimer's disease with late onset: Secondary | ICD-10-CM | POA: Diagnosis not present

## 2016-11-16 DIAGNOSIS — Z9181 History of falling: Secondary | ICD-10-CM | POA: Diagnosis not present

## 2016-11-16 DIAGNOSIS — M6281 Muscle weakness (generalized): Secondary | ICD-10-CM | POA: Diagnosis not present

## 2016-11-16 DIAGNOSIS — R1312 Dysphagia, oropharyngeal phase: Secondary | ICD-10-CM | POA: Diagnosis not present

## 2016-11-16 DIAGNOSIS — G301 Alzheimer's disease with late onset: Secondary | ICD-10-CM | POA: Diagnosis not present

## 2016-11-16 DIAGNOSIS — M47812 Spondylosis without myelopathy or radiculopathy, cervical region: Secondary | ICD-10-CM | POA: Diagnosis not present

## 2016-11-16 DIAGNOSIS — R2689 Other abnormalities of gait and mobility: Secondary | ICD-10-CM | POA: Diagnosis not present

## 2016-11-17 DIAGNOSIS — R2689 Other abnormalities of gait and mobility: Secondary | ICD-10-CM | POA: Diagnosis not present

## 2016-11-17 DIAGNOSIS — M6281 Muscle weakness (generalized): Secondary | ICD-10-CM | POA: Diagnosis not present

## 2016-11-17 DIAGNOSIS — I10 Essential (primary) hypertension: Secondary | ICD-10-CM | POA: Diagnosis not present

## 2016-11-17 DIAGNOSIS — M47812 Spondylosis without myelopathy or radiculopathy, cervical region: Secondary | ICD-10-CM | POA: Diagnosis not present

## 2016-11-17 DIAGNOSIS — Z9181 History of falling: Secondary | ICD-10-CM | POA: Diagnosis not present

## 2016-11-17 DIAGNOSIS — F325 Major depressive disorder, single episode, in full remission: Secondary | ICD-10-CM | POA: Diagnosis not present

## 2016-11-17 DIAGNOSIS — I5032 Chronic diastolic (congestive) heart failure: Secondary | ICD-10-CM | POA: Diagnosis not present

## 2016-11-17 DIAGNOSIS — G301 Alzheimer's disease with late onset: Secondary | ICD-10-CM | POA: Diagnosis not present

## 2016-11-17 DIAGNOSIS — F0280 Dementia in other diseases classified elsewhere without behavioral disturbance: Secondary | ICD-10-CM | POA: Diagnosis not present

## 2016-11-17 DIAGNOSIS — R1312 Dysphagia, oropharyngeal phase: Secondary | ICD-10-CM | POA: Diagnosis not present

## 2016-11-19 DIAGNOSIS — M47812 Spondylosis without myelopathy or radiculopathy, cervical region: Secondary | ICD-10-CM | POA: Diagnosis not present

## 2016-11-19 DIAGNOSIS — Z9181 History of falling: Secondary | ICD-10-CM | POA: Diagnosis not present

## 2016-11-19 DIAGNOSIS — G301 Alzheimer's disease with late onset: Secondary | ICD-10-CM | POA: Diagnosis not present

## 2016-11-19 DIAGNOSIS — R2689 Other abnormalities of gait and mobility: Secondary | ICD-10-CM | POA: Diagnosis not present

## 2016-11-19 DIAGNOSIS — R1312 Dysphagia, oropharyngeal phase: Secondary | ICD-10-CM | POA: Diagnosis not present

## 2016-11-19 DIAGNOSIS — M6281 Muscle weakness (generalized): Secondary | ICD-10-CM | POA: Diagnosis not present

## 2016-11-22 DIAGNOSIS — M47812 Spondylosis without myelopathy or radiculopathy, cervical region: Secondary | ICD-10-CM | POA: Diagnosis not present

## 2016-11-22 DIAGNOSIS — M6281 Muscle weakness (generalized): Secondary | ICD-10-CM | POA: Diagnosis not present

## 2016-11-22 DIAGNOSIS — R1312 Dysphagia, oropharyngeal phase: Secondary | ICD-10-CM | POA: Diagnosis not present

## 2016-11-22 DIAGNOSIS — G301 Alzheimer's disease with late onset: Secondary | ICD-10-CM | POA: Diagnosis not present

## 2016-11-22 DIAGNOSIS — R2689 Other abnormalities of gait and mobility: Secondary | ICD-10-CM | POA: Diagnosis not present

## 2016-11-22 DIAGNOSIS — Z9181 History of falling: Secondary | ICD-10-CM | POA: Diagnosis not present

## 2016-11-23 DIAGNOSIS — R2689 Other abnormalities of gait and mobility: Secondary | ICD-10-CM | POA: Diagnosis not present

## 2016-11-23 DIAGNOSIS — M47812 Spondylosis without myelopathy or radiculopathy, cervical region: Secondary | ICD-10-CM | POA: Diagnosis not present

## 2016-11-23 DIAGNOSIS — G301 Alzheimer's disease with late onset: Secondary | ICD-10-CM | POA: Diagnosis not present

## 2016-11-23 DIAGNOSIS — R1312 Dysphagia, oropharyngeal phase: Secondary | ICD-10-CM | POA: Diagnosis not present

## 2016-11-23 DIAGNOSIS — Z9181 History of falling: Secondary | ICD-10-CM | POA: Diagnosis not present

## 2016-11-23 DIAGNOSIS — M6281 Muscle weakness (generalized): Secondary | ICD-10-CM | POA: Diagnosis not present

## 2016-11-24 DIAGNOSIS — M47812 Spondylosis without myelopathy or radiculopathy, cervical region: Secondary | ICD-10-CM | POA: Diagnosis not present

## 2016-11-24 DIAGNOSIS — R2689 Other abnormalities of gait and mobility: Secondary | ICD-10-CM | POA: Diagnosis not present

## 2016-11-24 DIAGNOSIS — M6281 Muscle weakness (generalized): Secondary | ICD-10-CM | POA: Diagnosis not present

## 2016-11-24 DIAGNOSIS — R1312 Dysphagia, oropharyngeal phase: Secondary | ICD-10-CM | POA: Diagnosis not present

## 2016-11-24 DIAGNOSIS — Z9181 History of falling: Secondary | ICD-10-CM | POA: Diagnosis not present

## 2016-11-24 DIAGNOSIS — G301 Alzheimer's disease with late onset: Secondary | ICD-10-CM | POA: Diagnosis not present

## 2016-11-25 DIAGNOSIS — Z9181 History of falling: Secondary | ICD-10-CM | POA: Diagnosis not present

## 2016-11-25 DIAGNOSIS — M6281 Muscle weakness (generalized): Secondary | ICD-10-CM | POA: Diagnosis not present

## 2016-11-25 DIAGNOSIS — M47812 Spondylosis without myelopathy or radiculopathy, cervical region: Secondary | ICD-10-CM | POA: Diagnosis not present

## 2016-11-25 DIAGNOSIS — R1312 Dysphagia, oropharyngeal phase: Secondary | ICD-10-CM | POA: Diagnosis not present

## 2016-11-25 DIAGNOSIS — R2689 Other abnormalities of gait and mobility: Secondary | ICD-10-CM | POA: Diagnosis not present

## 2016-11-25 DIAGNOSIS — G301 Alzheimer's disease with late onset: Secondary | ICD-10-CM | POA: Diagnosis not present

## 2016-11-26 DIAGNOSIS — R1312 Dysphagia, oropharyngeal phase: Secondary | ICD-10-CM | POA: Diagnosis not present

## 2016-11-26 DIAGNOSIS — G301 Alzheimer's disease with late onset: Secondary | ICD-10-CM | POA: Diagnosis not present

## 2016-11-26 DIAGNOSIS — R2689 Other abnormalities of gait and mobility: Secondary | ICD-10-CM | POA: Diagnosis not present

## 2016-11-26 DIAGNOSIS — M47812 Spondylosis without myelopathy or radiculopathy, cervical region: Secondary | ICD-10-CM | POA: Diagnosis not present

## 2016-11-26 DIAGNOSIS — Z9181 History of falling: Secondary | ICD-10-CM | POA: Diagnosis not present

## 2016-11-26 DIAGNOSIS — M6281 Muscle weakness (generalized): Secondary | ICD-10-CM | POA: Diagnosis not present

## 2016-11-29 DIAGNOSIS — R2689 Other abnormalities of gait and mobility: Secondary | ICD-10-CM | POA: Diagnosis not present

## 2016-11-29 DIAGNOSIS — G301 Alzheimer's disease with late onset: Secondary | ICD-10-CM | POA: Diagnosis not present

## 2016-11-29 DIAGNOSIS — Z9181 History of falling: Secondary | ICD-10-CM | POA: Diagnosis not present

## 2016-11-29 DIAGNOSIS — M6281 Muscle weakness (generalized): Secondary | ICD-10-CM | POA: Diagnosis not present

## 2016-11-29 DIAGNOSIS — M47812 Spondylosis without myelopathy or radiculopathy, cervical region: Secondary | ICD-10-CM | POA: Diagnosis not present

## 2016-11-29 DIAGNOSIS — R1312 Dysphagia, oropharyngeal phase: Secondary | ICD-10-CM | POA: Diagnosis not present

## 2016-11-30 DIAGNOSIS — G301 Alzheimer's disease with late onset: Secondary | ICD-10-CM | POA: Diagnosis not present

## 2016-11-30 DIAGNOSIS — M6281 Muscle weakness (generalized): Secondary | ICD-10-CM | POA: Diagnosis not present

## 2016-11-30 DIAGNOSIS — R2689 Other abnormalities of gait and mobility: Secondary | ICD-10-CM | POA: Diagnosis not present

## 2016-11-30 DIAGNOSIS — Z9181 History of falling: Secondary | ICD-10-CM | POA: Diagnosis not present

## 2016-11-30 DIAGNOSIS — M47812 Spondylosis without myelopathy or radiculopathy, cervical region: Secondary | ICD-10-CM | POA: Diagnosis not present

## 2016-11-30 DIAGNOSIS — R1312 Dysphagia, oropharyngeal phase: Secondary | ICD-10-CM | POA: Diagnosis not present

## 2016-12-01 DIAGNOSIS — M6281 Muscle weakness (generalized): Secondary | ICD-10-CM | POA: Diagnosis not present

## 2016-12-01 DIAGNOSIS — M47812 Spondylosis without myelopathy or radiculopathy, cervical region: Secondary | ICD-10-CM | POA: Diagnosis not present

## 2016-12-01 DIAGNOSIS — Z9181 History of falling: Secondary | ICD-10-CM | POA: Diagnosis not present

## 2016-12-01 DIAGNOSIS — R1312 Dysphagia, oropharyngeal phase: Secondary | ICD-10-CM | POA: Diagnosis not present

## 2016-12-01 DIAGNOSIS — R2689 Other abnormalities of gait and mobility: Secondary | ICD-10-CM | POA: Diagnosis not present

## 2016-12-01 DIAGNOSIS — G301 Alzheimer's disease with late onset: Secondary | ICD-10-CM | POA: Diagnosis not present

## 2016-12-02 DIAGNOSIS — G301 Alzheimer's disease with late onset: Secondary | ICD-10-CM | POA: Diagnosis not present

## 2016-12-02 DIAGNOSIS — M47812 Spondylosis without myelopathy or radiculopathy, cervical region: Secondary | ICD-10-CM | POA: Diagnosis not present

## 2016-12-02 DIAGNOSIS — M6281 Muscle weakness (generalized): Secondary | ICD-10-CM | POA: Diagnosis not present

## 2016-12-02 DIAGNOSIS — R1312 Dysphagia, oropharyngeal phase: Secondary | ICD-10-CM | POA: Diagnosis not present

## 2016-12-02 DIAGNOSIS — R2689 Other abnormalities of gait and mobility: Secondary | ICD-10-CM | POA: Diagnosis not present

## 2016-12-02 DIAGNOSIS — Z9181 History of falling: Secondary | ICD-10-CM | POA: Diagnosis not present

## 2016-12-03 DIAGNOSIS — M6281 Muscle weakness (generalized): Secondary | ICD-10-CM | POA: Diagnosis not present

## 2016-12-03 DIAGNOSIS — R1312 Dysphagia, oropharyngeal phase: Secondary | ICD-10-CM | POA: Diagnosis not present

## 2016-12-03 DIAGNOSIS — M47812 Spondylosis without myelopathy or radiculopathy, cervical region: Secondary | ICD-10-CM | POA: Diagnosis not present

## 2016-12-03 DIAGNOSIS — R2689 Other abnormalities of gait and mobility: Secondary | ICD-10-CM | POA: Diagnosis not present

## 2016-12-03 DIAGNOSIS — G301 Alzheimer's disease with late onset: Secondary | ICD-10-CM | POA: Diagnosis not present

## 2016-12-03 DIAGNOSIS — Z9181 History of falling: Secondary | ICD-10-CM | POA: Diagnosis not present

## 2016-12-07 DIAGNOSIS — R1312 Dysphagia, oropharyngeal phase: Secondary | ICD-10-CM | POA: Diagnosis not present

## 2016-12-07 DIAGNOSIS — R2689 Other abnormalities of gait and mobility: Secondary | ICD-10-CM | POA: Diagnosis not present

## 2016-12-07 DIAGNOSIS — M47812 Spondylosis without myelopathy or radiculopathy, cervical region: Secondary | ICD-10-CM | POA: Diagnosis not present

## 2016-12-07 DIAGNOSIS — M6281 Muscle weakness (generalized): Secondary | ICD-10-CM | POA: Diagnosis not present

## 2016-12-07 DIAGNOSIS — Z9181 History of falling: Secondary | ICD-10-CM | POA: Diagnosis not present

## 2016-12-07 DIAGNOSIS — G301 Alzheimer's disease with late onset: Secondary | ICD-10-CM | POA: Diagnosis not present

## 2016-12-08 DIAGNOSIS — Z9181 History of falling: Secondary | ICD-10-CM | POA: Diagnosis not present

## 2016-12-08 DIAGNOSIS — G301 Alzheimer's disease with late onset: Secondary | ICD-10-CM | POA: Diagnosis not present

## 2016-12-08 DIAGNOSIS — R1312 Dysphagia, oropharyngeal phase: Secondary | ICD-10-CM | POA: Diagnosis not present

## 2016-12-08 DIAGNOSIS — M6281 Muscle weakness (generalized): Secondary | ICD-10-CM | POA: Diagnosis not present

## 2016-12-08 DIAGNOSIS — R2689 Other abnormalities of gait and mobility: Secondary | ICD-10-CM | POA: Diagnosis not present

## 2016-12-08 DIAGNOSIS — M47812 Spondylosis without myelopathy or radiculopathy, cervical region: Secondary | ICD-10-CM | POA: Diagnosis not present

## 2016-12-09 ENCOUNTER — Encounter
Admission: RE | Admit: 2016-12-09 | Discharge: 2016-12-09 | Disposition: A | Discharge status: Home / Self Care | Payer: Medicare Other | Source: Ambulatory Visit | Attending: Internal Medicine | Admitting: Internal Medicine

## 2016-12-09 DIAGNOSIS — G301 Alzheimer's disease with late onset: Secondary | ICD-10-CM | POA: Diagnosis not present

## 2016-12-09 DIAGNOSIS — Z9181 History of falling: Secondary | ICD-10-CM | POA: Diagnosis not present

## 2016-12-09 DIAGNOSIS — R2689 Other abnormalities of gait and mobility: Secondary | ICD-10-CM | POA: Diagnosis not present

## 2016-12-09 DIAGNOSIS — M47812 Spondylosis without myelopathy or radiculopathy, cervical region: Secondary | ICD-10-CM | POA: Diagnosis not present

## 2016-12-09 DIAGNOSIS — R1312 Dysphagia, oropharyngeal phase: Secondary | ICD-10-CM | POA: Diagnosis not present

## 2016-12-09 DIAGNOSIS — M6281 Muscle weakness (generalized): Secondary | ICD-10-CM | POA: Diagnosis not present

## 2016-12-10 DIAGNOSIS — M47812 Spondylosis without myelopathy or radiculopathy, cervical region: Secondary | ICD-10-CM | POA: Diagnosis not present

## 2016-12-10 DIAGNOSIS — G301 Alzheimer's disease with late onset: Secondary | ICD-10-CM | POA: Diagnosis not present

## 2016-12-10 DIAGNOSIS — Z9181 History of falling: Secondary | ICD-10-CM | POA: Diagnosis not present

## 2016-12-10 DIAGNOSIS — M6281 Muscle weakness (generalized): Secondary | ICD-10-CM | POA: Diagnosis not present

## 2016-12-10 DIAGNOSIS — R2689 Other abnormalities of gait and mobility: Secondary | ICD-10-CM | POA: Diagnosis not present

## 2016-12-10 DIAGNOSIS — R1312 Dysphagia, oropharyngeal phase: Secondary | ICD-10-CM | POA: Diagnosis not present

## 2016-12-13 DIAGNOSIS — R2689 Other abnormalities of gait and mobility: Secondary | ICD-10-CM | POA: Diagnosis not present

## 2016-12-13 DIAGNOSIS — M47812 Spondylosis without myelopathy or radiculopathy, cervical region: Secondary | ICD-10-CM | POA: Diagnosis not present

## 2016-12-13 DIAGNOSIS — R1312 Dysphagia, oropharyngeal phase: Secondary | ICD-10-CM | POA: Diagnosis not present

## 2016-12-13 DIAGNOSIS — Z9181 History of falling: Secondary | ICD-10-CM | POA: Diagnosis not present

## 2016-12-13 DIAGNOSIS — G301 Alzheimer's disease with late onset: Secondary | ICD-10-CM | POA: Diagnosis not present

## 2016-12-13 DIAGNOSIS — M6281 Muscle weakness (generalized): Secondary | ICD-10-CM | POA: Diagnosis not present

## 2016-12-14 ENCOUNTER — Other Ambulatory Visit
Admission: RE | Admit: 2016-12-14 | Discharge: 2016-12-14 | Disposition: A | Discharge status: Home / Self Care | Payer: Medicare Other | Source: Ambulatory Visit | Attending: Gerontology | Admitting: Gerontology

## 2016-12-14 ENCOUNTER — Non-Acute Institutional Stay: Payer: Medicare Other | Admitting: Gerontology

## 2016-12-14 ENCOUNTER — Encounter: Payer: Self-pay | Admitting: Gerontology

## 2016-12-14 DIAGNOSIS — H4010X0 Unspecified open-angle glaucoma, stage unspecified: Secondary | ICD-10-CM | POA: Diagnosis not present

## 2016-12-14 DIAGNOSIS — F0280 Dementia in other diseases classified elsewhere without behavioral disturbance: Secondary | ICD-10-CM | POA: Diagnosis not present

## 2016-12-14 DIAGNOSIS — M545 Low back pain: Secondary | ICD-10-CM

## 2016-12-14 DIAGNOSIS — M47812 Spondylosis without myelopathy or radiculopathy, cervical region: Secondary | ICD-10-CM

## 2016-12-14 DIAGNOSIS — G301 Alzheimer's disease with late onset: Secondary | ICD-10-CM

## 2016-12-14 DIAGNOSIS — K5900 Constipation, unspecified: Secondary | ICD-10-CM

## 2016-12-14 DIAGNOSIS — G4733 Obstructive sleep apnea (adult) (pediatric): Secondary | ICD-10-CM

## 2016-12-14 DIAGNOSIS — D649 Anemia, unspecified: Secondary | ICD-10-CM | POA: Diagnosis not present

## 2016-12-14 DIAGNOSIS — N401 Enlarged prostate with lower urinary tract symptoms: Secondary | ICD-10-CM

## 2016-12-14 DIAGNOSIS — Z9181 History of falling: Secondary | ICD-10-CM | POA: Diagnosis not present

## 2016-12-14 DIAGNOSIS — F39 Unspecified mood [affective] disorder: Secondary | ICD-10-CM | POA: Diagnosis not present

## 2016-12-14 DIAGNOSIS — R1312 Dysphagia, oropharyngeal phase: Secondary | ICD-10-CM

## 2016-12-14 DIAGNOSIS — J309 Allergic rhinitis, unspecified: Secondary | ICD-10-CM

## 2016-12-14 DIAGNOSIS — E876 Hypokalemia: Secondary | ICD-10-CM

## 2016-12-14 DIAGNOSIS — M1990 Unspecified osteoarthritis, unspecified site: Secondary | ICD-10-CM | POA: Diagnosis not present

## 2016-12-14 DIAGNOSIS — M6281 Muscle weakness (generalized): Secondary | ICD-10-CM | POA: Diagnosis not present

## 2016-12-14 DIAGNOSIS — I10 Essential (primary) hypertension: Secondary | ICD-10-CM | POA: Diagnosis not present

## 2016-12-14 DIAGNOSIS — R2689 Other abnormalities of gait and mobility: Secondary | ICD-10-CM | POA: Diagnosis not present

## 2016-12-14 DIAGNOSIS — E538 Deficiency of other specified B group vitamins: Secondary | ICD-10-CM

## 2016-12-14 DIAGNOSIS — F329 Major depressive disorder, single episode, unspecified: Secondary | ICD-10-CM

## 2016-12-14 DIAGNOSIS — N138 Other obstructive and reflux uropathy: Secondary | ICD-10-CM

## 2016-12-14 DIAGNOSIS — G4700 Insomnia, unspecified: Secondary | ICD-10-CM

## 2016-12-14 DIAGNOSIS — R410 Disorientation, unspecified: Secondary | ICD-10-CM | POA: Insufficient documentation

## 2016-12-14 DIAGNOSIS — E559 Vitamin D deficiency, unspecified: Secondary | ICD-10-CM

## 2016-12-14 LAB — URINALYSIS, ROUTINE W REFLEX MICROSCOPIC
Bilirubin Urine: NEGATIVE
GLUCOSE, UA: NEGATIVE mg/dL
HGB URINE DIPSTICK: NEGATIVE
KETONES UR: NEGATIVE mg/dL
LEUKOCYTES UA: NEGATIVE
NITRITE: NEGATIVE
PROTEIN: NEGATIVE mg/dL
Specific Gravity, Urine: 1.019 (ref 1.005–1.030)
pH: 5 (ref 5.0–8.0)

## 2016-12-15 DIAGNOSIS — Z9181 History of falling: Secondary | ICD-10-CM | POA: Diagnosis not present

## 2016-12-15 DIAGNOSIS — M47812 Spondylosis without myelopathy or radiculopathy, cervical region: Secondary | ICD-10-CM | POA: Diagnosis not present

## 2016-12-15 DIAGNOSIS — R2689 Other abnormalities of gait and mobility: Secondary | ICD-10-CM | POA: Diagnosis not present

## 2016-12-15 DIAGNOSIS — M6281 Muscle weakness (generalized): Secondary | ICD-10-CM | POA: Diagnosis not present

## 2016-12-15 DIAGNOSIS — R1312 Dysphagia, oropharyngeal phase: Secondary | ICD-10-CM | POA: Diagnosis not present

## 2016-12-15 DIAGNOSIS — G301 Alzheimer's disease with late onset: Secondary | ICD-10-CM | POA: Diagnosis not present

## 2016-12-16 DIAGNOSIS — M47812 Spondylosis without myelopathy or radiculopathy, cervical region: Secondary | ICD-10-CM | POA: Diagnosis not present

## 2016-12-16 DIAGNOSIS — R1312 Dysphagia, oropharyngeal phase: Secondary | ICD-10-CM | POA: Diagnosis not present

## 2016-12-16 DIAGNOSIS — G301 Alzheimer's disease with late onset: Secondary | ICD-10-CM | POA: Diagnosis not present

## 2016-12-16 DIAGNOSIS — M6281 Muscle weakness (generalized): Secondary | ICD-10-CM | POA: Diagnosis not present

## 2016-12-16 DIAGNOSIS — R2689 Other abnormalities of gait and mobility: Secondary | ICD-10-CM | POA: Diagnosis not present

## 2016-12-16 DIAGNOSIS — Z9181 History of falling: Secondary | ICD-10-CM | POA: Diagnosis not present

## 2016-12-16 LAB — URINE CULTURE

## 2016-12-17 DIAGNOSIS — Z9181 History of falling: Secondary | ICD-10-CM | POA: Diagnosis not present

## 2016-12-17 DIAGNOSIS — M6281 Muscle weakness (generalized): Secondary | ICD-10-CM | POA: Diagnosis not present

## 2016-12-17 DIAGNOSIS — G301 Alzheimer's disease with late onset: Secondary | ICD-10-CM | POA: Diagnosis not present

## 2016-12-17 DIAGNOSIS — R1312 Dysphagia, oropharyngeal phase: Secondary | ICD-10-CM | POA: Diagnosis not present

## 2016-12-17 DIAGNOSIS — M47812 Spondylosis without myelopathy or radiculopathy, cervical region: Secondary | ICD-10-CM | POA: Diagnosis not present

## 2016-12-17 DIAGNOSIS — R2689 Other abnormalities of gait and mobility: Secondary | ICD-10-CM | POA: Diagnosis not present

## 2017-01-08 ENCOUNTER — Encounter
Admission: RE | Admit: 2017-01-08 | Discharge: 2017-01-08 | Disposition: A | Discharge status: Home / Self Care | Payer: Medicare Other | Source: Ambulatory Visit | Attending: Internal Medicine | Admitting: Internal Medicine

## 2017-01-20 ENCOUNTER — Non-Acute Institutional Stay: Payer: Medicare Other | Admitting: Gerontology

## 2017-01-20 ENCOUNTER — Encounter: Payer: Self-pay | Admitting: Gerontology

## 2017-01-20 DIAGNOSIS — G301 Alzheimer's disease with late onset: Secondary | ICD-10-CM

## 2017-01-20 DIAGNOSIS — H4010X0 Unspecified open-angle glaucoma, stage unspecified: Secondary | ICD-10-CM

## 2017-01-20 DIAGNOSIS — M47812 Spondylosis without myelopathy or radiculopathy, cervical region: Secondary | ICD-10-CM

## 2017-01-20 DIAGNOSIS — F39 Unspecified mood [affective] disorder: Secondary | ICD-10-CM | POA: Diagnosis not present

## 2017-01-20 DIAGNOSIS — I10 Essential (primary) hypertension: Secondary | ICD-10-CM

## 2017-01-20 DIAGNOSIS — E876 Hypokalemia: Secondary | ICD-10-CM

## 2017-01-20 DIAGNOSIS — R1312 Dysphagia, oropharyngeal phase: Secondary | ICD-10-CM | POA: Diagnosis not present

## 2017-01-20 DIAGNOSIS — M1990 Unspecified osteoarthritis, unspecified site: Secondary | ICD-10-CM | POA: Diagnosis not present

## 2017-01-20 DIAGNOSIS — N138 Other obstructive and reflux uropathy: Secondary | ICD-10-CM

## 2017-01-20 DIAGNOSIS — G4733 Obstructive sleep apnea (adult) (pediatric): Secondary | ICD-10-CM | POA: Diagnosis not present

## 2017-01-20 DIAGNOSIS — F329 Major depressive disorder, single episode, unspecified: Secondary | ICD-10-CM

## 2017-01-20 DIAGNOSIS — M545 Low back pain: Secondary | ICD-10-CM

## 2017-01-20 DIAGNOSIS — E559 Vitamin D deficiency, unspecified: Secondary | ICD-10-CM

## 2017-01-20 DIAGNOSIS — N401 Enlarged prostate with lower urinary tract symptoms: Secondary | ICD-10-CM

## 2017-01-20 DIAGNOSIS — G4700 Insomnia, unspecified: Secondary | ICD-10-CM

## 2017-01-20 DIAGNOSIS — J309 Allergic rhinitis, unspecified: Secondary | ICD-10-CM

## 2017-01-20 DIAGNOSIS — E538 Deficiency of other specified B group vitamins: Secondary | ICD-10-CM

## 2017-01-20 DIAGNOSIS — Z9181 History of falling: Secondary | ICD-10-CM

## 2017-01-20 DIAGNOSIS — K5900 Constipation, unspecified: Secondary | ICD-10-CM

## 2017-01-20 DIAGNOSIS — D649 Anemia, unspecified: Secondary | ICD-10-CM

## 2017-01-20 DIAGNOSIS — F0280 Dementia in other diseases classified elsewhere without behavioral disturbance: Secondary | ICD-10-CM

## 2017-01-25 DIAGNOSIS — H401133 Primary open-angle glaucoma, bilateral, severe stage: Secondary | ICD-10-CM | POA: Diagnosis not present

## 2017-02-04 NOTE — Progress Notes (Signed)
Location:   [facility] Room Number: [phone] Place of Service:  ALF [phone]) Provider:  Toni Arthurs, NP-C  Kirk Ruths, MD  Patient Care Team: Kirk Ruths, MD as PCP - General (Internal Medicine)  Extended Emergency Contact Information Primary Emergency Contact: [facility] Address: [address] [place] [address] [name] of [place] Phone: [phone] Work Phone: [phone] Mobile Phone: [phone] Relation: Son Secondary Emergency Contact: Nelia Shi Address: [address]          [place], [place] [address] [name] of [place] Phone: [phone] Work Phone: [phone] Mobile Phone: [phone] Relation: Daughter  Code Status:  DNR Goals of care: Advanced Directive information Advanced Directives 01/20/2017  Does Patient Have a Medical Advance Directive? Yes  Type of Advance Directive Out of facility DNR (pink MOST or yellow form)  Does patient want to make changes to medical advance directive? No - Patient declined  Copy of [place] in Chart? -     Chief Complaint  Patient presents with  . Medical Management of Chronic Issues    Routine Visit    HPI:  Pt is a 83 y.o. male seen today for medical management of chronic diseases.  Patient has been stable over this past month.  No falls reported.  Patient is ambulatory with rolling walker.  Patient does not like participating in activities with others.  He prefers to sit in his recliner and watch TV.  Will occasionally drink a beer.  Occasionally will eat in the dining room for meals with others.  Incontinent of bowel and bladder.  He continues to have a very gradual mental decline.  Resides on the memory care unit.  Patient continues to typically eat most all of each meal.  The patient denies pain.  Vital signs stable.  No other complaints.   Past Medical History:  Diagnosis Date  . Anemia   . Benign prostatic  hyperplasia   . BPH without obstruction/lower urinary tract symptoms 03/27/2012  . Cervical vertebral fracture (HCC)    C7 with small subdural hematoma  . Dementia   . Depression 02/05/2015   unspecified  . Dysphagia   . Facial fracture ([place])   . Falling   . Glaucoma   . Hypertension   . Intracranial injury ([place])   . Macular degeneration    early/dry  . Malignant neoplasm of prostate ([place]) 05/22/2012  . Osteoarthritis   . POAG (primary open-angle glaucoma)   . Sleep apnea 02/04/2015  . Spondylosis of cervical joint   . Subdural hematoma ([place])   . Traumatic brain injury ([place])   . Urge incontinence 03/27/2012   Past Surgical History:  Procedure Laterality Date  . APPENDECTOMY    . AQUEOUS SHUNT Right 03/17/2015   Procedure:  . CHOLECYSTECTOMY     x 2  . GALLBLADDER SURGERY    . GLAUCOMA SURGERY Right 12/2006  . HERNIA REPAIR    . LENS SURGERY Right 12/29/2010   With i-stent  . LENS SURGERY Left 02/28/2011   With i-stent  . TONSILLECTOMY    . TURP VAPORIZATION      No Known Allergies  Allergies as of 01/20/2017   No Known Allergies     Medication List        Accurate as of 01/20/17 11:59 PM. Always use your most recent med list.          amLODipine 5 MG  tablet Commonly known as:  NORVASC Take 5 mg by mouth daily.   ASPERCREME W/LIDOCAINE 4 % cream Generic drug:  lidocaine Apply 1 application topically 3 (three) times daily as needed. Apply thin film to lumbo-sacral area for pain   aspirin EC 81 MG tablet Take 81 mg by mouth daily.   ASTEPRO 0.15 % Soln Generic drug:  Azelastine HCl Place 2 sprays into both nostrils at bedtime.   carvedilol 6.25 MG tablet Commonly known as:  COREG Take 6.25 mg by mouth every 12 (twelve) hours.   citalopram 20 MG tablet Commonly known as:  CELEXA Take 20 mg by mouth daily.   coal tar 0.5 % shampoo Commonly known as:  NEUTROGENA T-GEL Apply 1 application topically once a week. On thursdays     dorzolamide-timolol 22.3-6.8 MG/ML ophthalmic solution Commonly known as:  COSOPT Place 1 drop into both eyes 2 (two) times daily. Per Dr. Arlis Porta. Wait 5 mins between admin of multiple eye drops   ENDIT EX Apply liberal amount topically to areas of skin irritation as needed. Ok to leave at bedside.   folic acid 998 MCG tablet Commonly known as:  FOLVITE Take 400 mcg by mouth daily.   loperamide 2 MG capsule Commonly known as:  IMODIUM Give 4 mg by mouth prn for loose stools and 2 mg after each subsequent loose stool up to 8 doses in 24 hours   magnesium hydroxide 400 MG/5ML suspension Commonly known as:  MILK OF MAGNESIA Take 30 mLs by mouth daily as needed for mild constipation. For constipation no results in 24 hours may administer bisacodyl supp   MAPAP ARTHRITIS PAIN 650 MG CR tablet Generic drug:  acetaminophen Take 1,300 mg by mouth 2 (two) times daily.   memantine 5 MG tablet Commonly known as:  NAMENDA Take 5 mg by mouth 2 (two) times daily.   mometasone 50 MCG/ACT nasal spray Commonly known as:  NASONEX Place 2 sprays into the nose every morning.   potassium chloride 10 MEQ CR capsule Commonly known as:  MICRO-K Take 10 mEq by mouth 2 (two) times daily.   sennosides-docusate sodium 8.6-50 MG tablet Commonly known as:  SENOKOT-S Take 1 tablet by mouth at bedtime.   tamsulosin 0.4 MG Caps capsule Commonly known as:  FLOMAX Take 0.4 mg by mouth daily.   traMADol 50 MG tablet Commonly known as:  ULTRAM Take 50-100 mg by mouth every 4 (four) hours as needed for moderate pain or severe pain.   vitamin B-12 1000 MCG tablet Commonly known as:  CYANOCOBALAMIN Take 1,000 mcg by mouth daily.   Vitamin D3 2000 units capsule Take 2,000 Units by mouth daily.       Review of Systems  Unable to perform ROS: Dementia  Constitutional: Negative for activity change, appetite change, chills, diaphoresis and fever.  HENT: Negative for congestion, mouth sores,  nosebleeds, postnasal drip, sneezing, sore throat, trouble swallowing and voice change.   Eyes: Negative.   Respiratory: Negative for apnea, cough, choking, chest tightness, shortness of breath and wheezing.   Cardiovascular: Negative for chest pain, palpitations and leg swelling.  Gastrointestinal: Negative.  Negative for abdominal distention, abdominal pain, constipation, diarrhea and nausea.  Genitourinary: Negative.  Negative for difficulty urinating, dysuria, frequency and urgency.  Musculoskeletal: Positive for arthralgias (typical arthritis). Negative for back pain, gait problem and myalgias.  Skin: Negative for color change, pallor, rash and wound.  Neurological: Negative for dizziness, tremors, syncope, speech difficulty, weakness, numbness and headaches.  Psychiatric/Behavioral: Negative.  Negative for agitation and behavioral problems.  All other systems reviewed and are negative.   Immunization History  Administered Date(s) Administered  . Influenza-Unspecified 11/14/2015, 11/02/2016  . PPD Test 03/05/2016   Pertinent  Health Maintenance Due  Topic Date Due  . PNA vac Low Risk Adult (1 of 2 - PCV13) 02/23/1992  . INFLUENZA VACCINE  Completed   No flowsheet data found. Functional Status Survey:    Vitals:   01/20/17 1126  BP: (!) 162/67  Pulse: 70  Resp: 20  Temp: 97.8 F (36.6 C)  TempSrc: Oral  SpO2: 96%  Weight: 165 lb 4.8 oz (75 kg)  Height: 5\' 5"  (1.651 m)   Body mass index is 27.51 kg/m. Physical Exam  Constitutional: He is oriented to person, place, and time. Vital signs are normal. He appears well-developed and well-nourished. He is active and cooperative. He does not appear ill. No distress.  HENT:  Head: Normocephalic and atraumatic.  Mouth/Throat: Uvula is midline, oropharynx is clear and moist and mucous membranes are normal. Mucous membranes are not pale, not dry and not cyanotic.  Eyes: Conjunctivae, EOM and lids are normal. Pupils are equal,  round, and reactive to light.  Neck: Trachea normal, normal range of motion and full passive range of motion without pain. Neck supple. No JVD present. No tracheal deviation, no edema and no erythema present. No thyromegaly present.  Cardiovascular: Normal rate, regular rhythm, normal heart sounds, intact distal pulses and normal pulses. Exam reveals no gallop, no distant heart sounds and no friction rub.  No murmur heard. Pulses:      Dorsalis pedis pulses are 2+ on the right side, and 2+ on the left side.  No edema  Pulmonary/Chest: Effort normal and breath sounds normal. No accessory muscle usage. No respiratory distress. He has no decreased breath sounds. He has no wheezes. He has no rhonchi. He has no rales. He exhibits no tenderness.  Abdominal: Soft. Normal appearance and bowel sounds are normal. He exhibits no distension and no ascites. There is no tenderness.  Musculoskeletal: He exhibits no edema or tenderness.       Lumbar back: He exhibits decreased range of motion and pain (at times).  Expected osteoarthritis, stiffness; calves soft, supple. Negative Homan's sign. Increased difficulty walking d/t back pain  Neurological: He is alert and oriented to person, place, and time. He has normal strength. Coordination and gait abnormal.  Skin: Skin is warm, dry and intact. He is not diaphoretic. No cyanosis. No pallor. Nails show no clubbing.  Psychiatric: He has a normal mood and affect. His speech is normal and behavior is normal. Judgment and thought content normal. Cognition and memory are impaired. He exhibits abnormal recent memory.  Nursing note and vitals reviewed.   Labs reviewed: Recent Labs    04/26/16 0721 10/28/16 0510  NA 137 139  K 3.8 3.6  CL 105 109  CO2 26 24  GLUCOSE 118* 105*  BUN 19 21*  CREATININE 0.71 0.90  CALCIUM 8.6* 8.5*  MG  --  2.2   Recent Labs    10/28/16 0510  AST 15  ALT 14*  ALKPHOS 47  BILITOT 0.6  PROT 6.3*  ALBUMIN 3.3*   Recent Labs     04/26/16 0721 10/28/16 0510  WBC 10.9* 6.5  NEUTROABS  --  4.0  HGB 12.8* 12.0*  HCT 37.7* 35.0*  MCV 91.2 91.7  PLT 242 215   Lab Results  Component Value Date   TSH 1.698 10/28/2016  No results found for: HGBA1C No results found for: CHOL, HDL, LDLCALC, LDLDIRECT, TRIG, CHOLHDL  Significant Diagnostic Results in last 30 days:  No results found.  Assessment/Plan 1. Late onset Alzheimer's disease without behavioral disturbance 2. Dementia in other diseases classified elsewhere without behavioral disturbance  Stable  Continue Namenda 5 mg po BID  3. Oropharyngeal dysphagia  Stable   4. Spondylosis without myelopathy or radiculopathy, cervical region  Stable  See osteoarthritis   5. Osteoarthritis, unspecified osteoarthritis type, unspecified site  Stable  Continue Mapap Arthritis Pain (acetaminophen) [OTC] tablet extended release; 650 mg; amt: 1300mg =2tabs PO BID  6. Essential (primary) hypertension  Stable  Continue Amlodipine 5 mg po Q Day  Continue ASA 81 mg po Q Day  Continue Carvedilol 6.25 mg po BID  7. Open-angle glaucoma, unspecified glaucoma stage, unspecified laterality, unspecified open-angle glaucoma type  Stable  Continue Cosopt (dorzolamide-timolol) drops; 22.3-6.8 mg/m- 1 drop both eyes BID   Medications for glaucoma managed by Dr. Dustin Flock  8. Mood disorder ([place]) 9. Major depressive disorder with single episode, remission status unspecified  Stable  Continue Celexa 20 mg po Q Day  10. Obstructive sleep apnea (adult) (pediatric)  stable  11. Anemia, unspecified type  Stable   12. Benign prostatic hyperplasia with urinary obstruction  Stable  Continue tamsulosin capsule,extended release 24hr; 0.4 mg; PO Q Day  13. Hx of falling  Stable   14. Low back pain, unspecified back pain laterality, unspecified chronicity, with sciatica presence unspecified  Stable  Continue Aspercreme 4%- thin film TID  prn  Continue naproxen sodium [OTC] tablet; 220 mg; amt: 1 tablet po BID  Continue Tramadol 50 mg 1-2 tablets po Q 4 hours prn  15. Allergic rhinitis, unspecified seasonality, unspecified trigger  Stable  Continue Astepro 0.15% 2 sprays in each nostril Q HS  Continue Nasonex (mometasone) spray,non-aerosol; 50 mcg/actuation; amt: 2sprays into each nostril Q AM  16. Vitamin D deficiency  Stable  Continue Cholecalciferol 2,000 units po Q Day  17. Vitamin B12 deficiency  Stable  Continue Cyanocobalamin 1,000 mcg po Q Day  Continue Folic Acid 979 mcg po Q Day  18. Insomnia, unspecified type  Stable  Decrease trazodone 25 mg po Q HS- GDR  19. Hypokalemia  Stable  Continue potassium chloride capsule, extended release; 10 mEq po BID  20. Constipation  Stable   Continue Senna S 1 tablet po Q HS  Continue all medications as listed above (01/20/17)  Family/ staff Communication:   Total Time:  Documentation:  Face to Face:  Family/Phone:   Labs/tests ordered: Not due  Medication list reviewed and assessed for continued appropriateness. Monthly medication orders reviewed and signed.  Vikki Ports, NP-C Geriatrics [facility] [phone], [place] [address] Cell Phone (Mon-Fri 8am-5pm):  [phone] On Call:  [phone] & follow prompts after 5pm & weekends Office Phone:  [phone] Office Fax:  [phone]

## 2017-02-04 NOTE — Progress Notes (Signed)
Location:   [facility] Room Number: [phone] Place of Service:  ALF [phone]) Provider:  Toni Arthurs, NP-C  Kirk Ruths, MD  Patient Care Team: Kirk Ruths, MD as PCP - General (Internal Medicine)  Extended Emergency Contact Information Primary Emergency Contact: [facility] Address: [address] [place] [address] [name] of [place] Phone: [phone] Work Phone: [phone] Mobile Phone: [phone] Relation: Son Secondary Emergency Contact: Nelia Shi Address: [address]          [place], [place] [address] [name] of [place] Phone: [phone] Work Phone: [phone] Mobile Phone: [phone] Relation: Daughter  Code Status:  DNR Goals of care: Advanced Directive information Advanced Directives 01/20/2017  Does Patient Have a Medical Advance Directive? Yes  Type of Advance Directive Out of facility DNR (pink MOST or yellow form)  Does patient want to make changes to medical advance directive? No - Patient declined  Copy of [place] in Chart? -     Chief Complaint  Patient presents with  . Medical Management of Chronic Issues    Routine Visit    HPI:  Pt is a 83 y.o. male seen today for medical management of chronic diseases.  Patient has been stable this month.  No falls this past month.  Patient is ambulatory with rolling walker.  Patient does not like participating in activities with others.  He prefers to sit in his recliner and watch TV, but does occasionally eat in the dining room for meals.  Incontinent of bowel and bladder.  He continues to have a very gradual mental decline.  Now resides on the memory care unit.  Patient continues to typically eat most all of each meal.  The patient denies pain.  Vital signs stable.  No other complaints.   Past Medical History:  Diagnosis Date  . Anemia   . Benign prostatic hyperplasia   . BPH without  obstruction/lower urinary tract symptoms 03/27/2012  . Cervical vertebral fracture (HCC)    C7 with small subdural hematoma  . Dementia   . Depression 02/05/2015   unspecified  . Dysphagia   . Facial fracture ([place])   . Falling   . Glaucoma   . Hypertension   . Intracranial injury ([place])   . Macular degeneration    early/dry  . Malignant neoplasm of prostate ([place]) 05/22/2012  . Osteoarthritis   . POAG (primary open-angle glaucoma)   . Sleep apnea 02/04/2015  . Spondylosis of cervical joint   . Subdural hematoma ([place])   . Traumatic brain injury ([place])   . Urge incontinence 03/27/2012   Past Surgical History:  Procedure Laterality Date  . APPENDECTOMY    . AQUEOUS SHUNT Right 03/17/2015   Procedure:  . CHOLECYSTECTOMY     x 2  . GALLBLADDER SURGERY    . GLAUCOMA SURGERY Right 12/2006  . HERNIA REPAIR    . LENS SURGERY Right 12/29/2010   With i-stent  . LENS SURGERY Left 02/28/2011   With i-stent  . TONSILLECTOMY    . TURP VAPORIZATION      No Known Allergies  Allergies as of 12/14/2016   No Known Allergies     Medication List        Accurate as of 12/14/16 11:59 PM. Always use your most recent med list.          amLODipine 5 MG tablet Commonly known as:  NORVASC Take  5 mg by mouth daily.   ASPERCREME W/LIDOCAINE 4 % cream Generic drug:  lidocaine Apply 1 application topically 3 (three) times daily as needed. Apply thin film to lumbo-sacral area for pain   aspirin EC 81 MG tablet Take 81 mg by mouth daily.   ASTEPRO 0.15 % Soln Generic drug:  Azelastine HCl Place 2 sprays into both nostrils at bedtime.   carvedilol 6.25 MG tablet Commonly known as:  COREG Take 6.25 mg by mouth every 12 (twelve) hours.   citalopram 20 MG tablet Commonly known as:  CELEXA Take 20 mg by mouth daily.   coal tar 0.5 % shampoo Commonly known as:  NEUTROGENA T-GEL Apply 1 application topically once a week. On thursdays   dorzolamide-timolol 22.3-6.8 MG/ML ophthalmic  solution Commonly known as:  COSOPT Place 1 drop into both eyes 2 (two) times daily. Per Dr. Arlis Porta. Wait 5 mins between admin of multiple eye drops   ENDIT EX Apply liberal amount topically to areas of skin irritation as needed. Ok to leave at bedside.   folic acid 938 MCG tablet Commonly known as:  FOLVITE Take 400 mcg by mouth daily.   loperamide 2 MG capsule Commonly known as:  IMODIUM Give 4 mg by mouth prn for loose stools and 2 mg after each subsequent loose stool up to 8 doses in 24 hours   magnesium hydroxide 400 MG/5ML suspension Commonly known as:  MILK OF MAGNESIA Take 30 mLs by mouth daily as needed for mild constipation. For constipation no results in 24 hours may administer bisacodyl supp   MAPAP ARTHRITIS PAIN 650 MG CR tablet Generic drug:  acetaminophen Take 1,300 mg by mouth 2 (two) times daily.   memantine 5 MG tablet Commonly known as:  NAMENDA Take 5 mg by mouth 2 (two) times daily.   mometasone 50 MCG/ACT nasal spray Commonly known as:  NASONEX Place 2 sprays into the nose every morning.   potassium chloride 10 MEQ CR capsule Commonly known as:  MICRO-K Take 10 mEq by mouth 2 (two) times daily.   sennosides-docusate sodium 8.6-50 MG tablet Commonly known as:  SENOKOT-S Take 1 tablet by mouth at bedtime.   tamsulosin 0.4 MG Caps capsule Commonly known as:  FLOMAX Take 0.4 mg by mouth daily.   traMADol 50 MG tablet Commonly known as:  ULTRAM Take 50-100 mg by mouth every 4 (four) hours as needed for moderate pain or severe pain.   traZODone 50 MG tablet Commonly known as:  DESYREL Take 50 mg by mouth at bedtime as needed for sleep. Ok to hold if pt is sleeping   vitamin B-12 1000 MCG tablet Commonly known as:  CYANOCOBALAMIN Take 1,000 mcg by mouth daily.   Vitamin D3 2000 units capsule Take 2,000 Units by mouth daily.       Review of Systems  Unable to perform ROS: Dementia  Constitutional: Negative for activity change, appetite  change, chills, diaphoresis and fever.  HENT: Negative for congestion, mouth sores, nosebleeds, postnasal drip, sneezing, sore throat, trouble swallowing and voice change.   Eyes: Negative.   Respiratory: Negative for apnea, cough, choking, chest tightness, shortness of breath and wheezing.   Cardiovascular: Negative for chest pain, palpitations and leg swelling.  Gastrointestinal: Negative.  Negative for abdominal distention, abdominal pain, constipation, diarrhea and nausea.  Genitourinary: Negative.  Negative for difficulty urinating, dysuria, frequency and urgency.  Musculoskeletal: Positive for arthralgias (typical arthritis). Negative for back pain, gait problem and myalgias.  Skin: Negative for color  change, pallor, rash and wound.  Neurological: Negative for dizziness, tremors, syncope, speech difficulty, weakness, numbness and headaches.  Psychiatric/Behavioral: Negative.  Negative for agitation and behavioral problems.  All other systems reviewed and are negative.   Immunization History  Administered Date(s) Administered  . Influenza-Unspecified 11/14/2015, 11/02/2016  . PPD Test 03/05/2016   Pertinent  Health Maintenance Due  Topic Date Due  . PNA vac Low Risk Adult (1 of 2 - PCV13) 02/23/1992  . INFLUENZA VACCINE  Completed   No flowsheet data found. Functional Status Survey:    Vitals:   12/14/16 1047  BP: (!) 164/79  Pulse: 75  Resp: 18  Temp: 98.4 F (36.9 C)  TempSrc: Oral  SpO2: 96%  Weight: 163 lb 14.4 oz (74.3 kg)  Height: 5\' 5"  (1.651 m)   Body mass index is 27.27 kg/m. Physical Exam  Constitutional: He is oriented to person, place, and time. Vital signs are normal. He appears well-developed and well-nourished. He is active and cooperative. He does not appear ill. No distress.  HENT:  Head: Normocephalic and atraumatic.  Mouth/Throat: Uvula is midline, oropharynx is clear and moist and mucous membranes are normal. Mucous membranes are not pale, not  dry and not cyanotic.  Eyes: Conjunctivae, EOM and lids are normal. Pupils are equal, round, and reactive to light.  Neck: Trachea normal, normal range of motion and full passive range of motion without pain. Neck supple. No JVD present. No tracheal deviation, no edema and no erythema present. No thyromegaly present.  Cardiovascular: Normal rate, regular rhythm, normal heart sounds, intact distal pulses and normal pulses. Exam reveals no gallop, no distant heart sounds and no friction rub.  No murmur heard. Pulses:      Dorsalis pedis pulses are 2+ on the right side, and 2+ on the left side.  No edema  Pulmonary/Chest: Effort normal and breath sounds normal. No accessory muscle usage. No respiratory distress. He has no decreased breath sounds. He has no wheezes. He has no rhonchi. He has no rales. He exhibits no tenderness.  Abdominal: Soft. Normal appearance and bowel sounds are normal. He exhibits no distension and no ascites. There is no tenderness.  Musculoskeletal: He exhibits no edema or tenderness.       Lumbar back: He exhibits decreased range of motion and pain (at times).  Expected osteoarthritis, stiffness; calves soft, supple. Negative Homan's sign. Increased difficulty walking d/t back pain  Neurological: He is alert and oriented to person, place, and time. He has normal strength. Coordination and gait abnormal.  Skin: Skin is warm, dry and intact. He is not diaphoretic. No cyanosis. No pallor. Nails show no clubbing.  Psychiatric: He has a normal mood and affect. His speech is normal and behavior is normal. Judgment and thought content normal. Cognition and memory are impaired. He exhibits abnormal recent memory.  Nursing note and vitals reviewed.   Labs reviewed: Recent Labs    04/26/16 0721 10/28/16 0510  NA 137 139  K 3.8 3.6  CL 105 109  CO2 26 24  GLUCOSE 118* 105*  BUN 19 21*  CREATININE 0.71 0.90  CALCIUM 8.6* 8.5*  MG  --  2.2   Recent Labs    10/28/16 0510    AST 15  ALT 14*  ALKPHOS 47  BILITOT 0.6  PROT 6.3*  ALBUMIN 3.3*   Recent Labs    04/26/16 0721 10/28/16 0510  WBC 10.9* 6.5  NEUTROABS  --  4.0  HGB 12.8* 12.0*  HCT  37.7* 35.0*  MCV 91.2 91.7  PLT 242 215   Lab Results  Component Value Date   TSH 1.698 10/28/2016   No results found for: HGBA1C No results found for: CHOL, HDL, LDLCALC, LDLDIRECT, TRIG, CHOLHDL  Significant Diagnostic Results in last 30 days:  No results found.  Assessment/Plan 1. Late onset Alzheimer's disease without behavioral disturbance 2. Dementia in other diseases classified elsewhere without behavioral disturbance  Stable  Continue Namenda 5 mg po BID  3. Oropharyngeal dysphagia  Stable   4. Spondylosis without myelopathy or radiculopathy, cervical region  Stable  See osteoarthritis   5. Osteoarthritis, unspecified osteoarthritis type, unspecified site  Stable  Continue Mapap Arthritis Pain (acetaminophen) [OTC] tablet extended release; 650 mg; amt: 1300mg =2tabs PO BID  6. Essential (primary) hypertension  Stable  Continue Amlodipine 5 mg po Q Day  Continue ASA 81 mg po Q Day  Continue Carvedilol 6.25 mg po BID  7. Open-angle glaucoma, unspecified glaucoma stage, unspecified laterality, unspecified open-angle glaucoma type  Stable  Continue Cosopt (dorzolamide-timolol) drops; 22.3-6.8 mg/m- 1 drop both eyes BID   Medications for glaucoma managed by Dr. Dustin Flock  8. Mood disorder ([place]) 9. Major depressive disorder with single episode, remission status unspecified  Stable  Continue Celexa 20 mg po Q Day  10. Obstructive sleep apnea (adult) (pediatric)  stable  11. Anemia, unspecified type  Stable   12. Benign prostatic hyperplasia with urinary obstruction  Stable  Continue tamsulosin capsule,extended release 24hr; 0.4 mg; PO Q Day  13. Hx of falling  Stable   14. Low back pain, unspecified back pain laterality, unspecified chronicity, with  sciatica presence unspecified  Stable  Continue Aspercreme 4%- thin film TID prn  Continue naproxen sodium [OTC] tablet; 220 mg; amt: 1 tablet po BID  Continue Tramadol 50 mg 1-2 tablets po Q 4 hours prn  15. Allergic rhinitis, unspecified seasonality, unspecified trigger  Stable  Continue Astepro 0.15% 2 sprays in each nostril Q HS  Continue Nasonex (mometasone) spray,non-aerosol; 50 mcg/actuation; amt: 2sprays into each nostril Q AM  16. Vitamin D deficiency  Stable  Continue Cholecalciferol 2,000 units po Q Day  17. Vitamin B12 deficiency  Stable  Continue Cyanocobalamin 1,000 mcg po Q Day  Continue Folic Acid 185 mcg po Q Day  18. Insomnia, unspecified type  Stable  Continue Trazodone 50 mg po Q HS  19. Hypokalemia  Stable  Continue potassium chloride capsule, extended release; 10 mEq po BID  20. Constipation  Stable   Continue Senna S 1 tablet po Q HS  Continue all medications as listed above (12/14/16)  Family/ staff Communication:   Total Time:  Documentation:  Face to Face:  Family/Phone:   Labs/tests ordered: Not due  Medication list reviewed and assessed for continued appropriateness. Monthly medication orders reviewed and signed.  Vikki Ports, NP-C [facility] [phone] [place], [place] [address] Cell Phone (Mon-Fri 8am-5pm):  [phone] On Call:  [phone] & follow prompts after 5pm & weekends Office Phone:  [phone] Office Fax:  [phone]

## 2017-02-08 ENCOUNTER — Encounter
Admission: RE | Admit: 2017-02-08 | Discharge: 2017-02-08 | Disposition: A | Discharge status: Home / Self Care | Payer: Medicare Other | Source: Ambulatory Visit | Attending: Internal Medicine | Admitting: Internal Medicine

## 2017-02-23 ENCOUNTER — Non-Acute Institutional Stay: Payer: Medicare Other | Admitting: Gerontology

## 2017-02-23 ENCOUNTER — Encounter: Payer: Self-pay | Admitting: Gerontology

## 2017-02-23 DIAGNOSIS — G4733 Obstructive sleep apnea (adult) (pediatric): Secondary | ICD-10-CM

## 2017-02-23 DIAGNOSIS — R1312 Dysphagia, oropharyngeal phase: Secondary | ICD-10-CM | POA: Diagnosis not present

## 2017-02-23 DIAGNOSIS — I10 Essential (primary) hypertension: Secondary | ICD-10-CM

## 2017-02-27 NOTE — Assessment & Plan Note (Signed)
Stable. Pt does not currently use C-PAP

## 2017-02-27 NOTE — Assessment & Plan Note (Signed)
Stable. No recent episodes of hypotension. Pressures controlled with Coreg and Norvasc

## 2017-02-27 NOTE — Assessment & Plan Note (Signed)
Stable. No recent choking or aspiration. Pt is on a Regular diet

## 2017-02-27 NOTE — Progress Notes (Signed)
Location:    Nursing Home Room Number: 240X Place of Service:  ALF [phone]) Provider:  Toni Arthurs, NP-C  Kirk Ruths, MD  Patient Care Team: Kirk Ruths, MD as PCP - General (Internal Medicine)  Extended Emergency Contact Information Primary Emergency Contact: [facility] Address: [address] [place] [address] [name] of [place] Phone: [phone] Work Phone: [phone] Mobile Phone: [phone] Relation: Son Secondary Emergency Contact: Nelia Shi Address: [address]          [place], [place] [address] [name] of [place] Phone: [phone] Work Phone: [phone] Mobile Phone: [phone] Relation: Daughter  Code Status:  DNR Goals of care: Advanced Directive information Advanced Directives 02/23/2017  Does Patient Have a Medical Advance Directive? Yes  Type of Advance Directive Out of facility DNR (pink MOST or yellow form)  Does patient want to make changes to medical advance directive? No - Patient declined  Copy of [place] in Chart? -  Pre-existing out of facility DNR order (yellow form or pink MOST form) Yellow form placed in chart (order not valid for inpatient use)     Chief Complaint  Patient presents with  . Medical Management of Chronic Issues    Routine Visit    HPI:  Pt is a 84 y.o. male seen today for medical management of chronic diseases.    Essential (primary) hypertension Stable. No recent episodes of hypotension. Pressures controlled with Coreg and Norvasc  Oropharyngeal dysphagia Stable. No recent choking or aspiration. Pt is on a Regular diet  Obstructive sleep apnea (adult) (pediatric) Stable. Pt does not currently use C-PAP  Please note pt with limited verbal ability. Unable to obtain complete ROS. Some ROS info obtained from staff and documentation.   Past Medical History:  Diagnosis Date  . Anemia   . Benign prostatic hyperplasia   . BPH  without obstruction/lower urinary tract symptoms 03/27/2012  . Cervical vertebral fracture (HCC)    C7 with small subdural hematoma  . Dementia   . Depression 02/05/2015   unspecified  . Dysphagia   . Facial fracture ([place])   . Falling   . Glaucoma   . Hypertension   . Intracranial injury ([place])   . Macular degeneration    early/dry  . Malignant neoplasm of prostate ([place]) 05/22/2012  . Osteoarthritis   . POAG (primary open-angle glaucoma)   . Sleep apnea 02/04/2015  . Spondylosis of cervical joint   . Subdural hematoma ([place])   . Traumatic brain injury ([place])   . Urge incontinence 03/27/2012   Past Surgical History:  Procedure Laterality Date  . APPENDECTOMY    . AQUEOUS SHUNT Right 03/17/2015   Procedure:  . CHOLECYSTECTOMY     x 2  . GALLBLADDER SURGERY    . GLAUCOMA SURGERY Right 12/2006  . HERNIA REPAIR    . LENS SURGERY Right 12/29/2010   With i-stent  . LENS SURGERY Left 02/28/2011   With i-stent  . TONSILLECTOMY    . TURP VAPORIZATION      No Known Allergies  Allergies as of 02/23/2017   No Known Allergies     Medication List        Accurate as of 02/23/17 11:59 PM. Always use your most recent med list.          amLODipine 5 MG tablet Commonly known as:  NORVASC Take 5 mg by mouth daily.   ASPERCREME W/LIDOCAINE 4 %  cream Generic drug:  lidocaine Apply 1 application topically 3 (three) times daily as needed. Apply thin film to lumbo-sacral area for pain   aspirin EC 81 MG tablet Take 81 mg by mouth daily.   ASTEPRO 0.15 % Soln Generic drug:  Azelastine HCl Place 2 sprays into both nostrils at bedtime.   brimonidine 0.2 % ophthalmic solution Commonly known as:  ALPHAGAN Place 1 drop into both eyes 2 (two) times daily. Per Dr. Laveda Norman, MD   carvedilol 6.25 MG tablet Commonly known as:  COREG Take 6.25 mg by mouth every 12 (twelve) hours.   citalopram 20 MG tablet Commonly known as:  CELEXA Take 20 mg by mouth daily.   coal tar 0.5 %  shampoo Commonly known as:  NEUTROGENA T-GEL Apply 1 application topically once a week. On thursdays   dorzolamide-timolol 22.3-6.8 MG/ML ophthalmic solution Commonly known as:  COSOPT Place 1 drop into both eyes 2 (two) times daily. Per Dr. Arlis Porta. Wait 5 mins between admin of multiple eye drops   ENDIT EX Apply liberal amount topically to areas of skin irritation as needed. Ok to leave at bedside.   folic acid 132 MCG tablet Commonly known as:  FOLVITE Take 400 mcg by mouth daily.   loperamide 2 MG capsule Commonly known as:  IMODIUM Give 4 mg by mouth prn for loose stools and 2 mg after each subsequent loose stool up to 8 doses in 24 hours   magnesium hydroxide 400 MG/5ML suspension Commonly known as:  MILK OF MAGNESIA Take 30 mLs by mouth daily as needed for mild constipation. For constipation no results in 24 hours may administer bisacodyl supp   MAPAP ARTHRITIS PAIN 650 MG CR tablet Generic drug:  acetaminophen Take 1,300 mg by mouth 2 (two) times daily.   memantine 5 MG tablet Commonly known as:  NAMENDA Take 5 mg by mouth 2 (two) times daily.   mometasone 50 MCG/ACT nasal spray Commonly known as:  NASONEX Place 2 sprays into the nose every morning.   potassium chloride 10 MEQ CR capsule Commonly known as:  MICRO-K Take 10 mEq by mouth 2 (two) times daily.   sennosides-docusate sodium 8.6-50 MG tablet Commonly known as:  SENOKOT-S Take 1 tablet by mouth at bedtime.   tamsulosin 0.4 MG Caps capsule Commonly known as:  FLOMAX Take 0.4 mg by mouth daily.   traMADol 50 MG tablet Commonly known as:  ULTRAM Take 50-100 mg by mouth every 4 (four) hours as needed for moderate pain or severe pain.   traZODone 50 MG tablet Commonly known as:  DESYREL Take 25 mg by mouth at bedtime. 1/2 tab. Ok to hold if pt is sleeping   vitamin B-12 1000 MCG tablet Commonly known as:  CYANOCOBALAMIN Take 1,000 mcg by mouth daily.   Vitamin D3 2000 units capsule Take 2,000  Units by mouth daily.       Review of Systems  Constitutional: Negative for activity change, appetite change, chills, diaphoresis and fever.  HENT: Negative for congestion, mouth sores, nosebleeds, postnasal drip, sneezing, sore throat, trouble swallowing and voice change.   Respiratory: Negative for apnea, cough, choking, chest tightness, shortness of breath and wheezing.   Cardiovascular: Negative for chest pain, palpitations and leg swelling.  Gastrointestinal: Negative for abdominal distention, abdominal pain, constipation, diarrhea and nausea.  Genitourinary: Negative for difficulty urinating, dysuria, frequency and urgency.  Musculoskeletal: Positive for arthralgias (typical arthritis) and back pain. Negative for gait problem and myalgias.  Skin: Negative for color  change, pallor, rash and wound.  Neurological: Negative for dizziness, tremors, syncope, speech difficulty, weakness, numbness and headaches.  Psychiatric/Behavioral: Negative for agitation and behavioral problems.  All other systems reviewed and are negative.   Immunization History  Administered Date(s) Administered  . Influenza-Unspecified 11/14/2015, 11/02/2016  . PPD Test 03/05/2016   Pertinent  Health Maintenance Due  Topic Date Due  . PNA vac Low Risk Adult (1 of 2 - PCV13) 02/23/1992  . INFLUENZA VACCINE  Completed   No flowsheet data found. Functional Status Survey:    Vitals:   02/23/17 1040  BP: 136/73  Pulse: 68  Resp: 18  Temp: 97.7 F (36.5 C)  TempSrc: Oral  SpO2: 97%  Weight: 168 lb 4.8 oz (76.3 kg)  Height: 5\' 5"  (1.651 m)   Body mass index is 28.01 kg/m. Physical Exam  Constitutional: Vital signs are normal. He appears well-developed and well-nourished. He is active and cooperative. He does not appear ill. No distress.  HENT:  Head: Normocephalic and atraumatic.  Mouth/Throat: Uvula is midline, oropharynx is clear and moist and mucous membranes are normal. Mucous membranes are not  pale, not dry and not cyanotic.  Eyes: Conjunctivae, EOM and lids are normal. Pupils are equal, round, and reactive to light.  Neck: Trachea normal, normal range of motion and full passive range of motion without pain. Neck supple. No JVD present. No tracheal deviation, no edema and no erythema present. No thyromegaly present.  Cardiovascular: Normal rate, regular rhythm, normal heart sounds, intact distal pulses and normal pulses. Exam reveals no gallop, no distant heart sounds and no friction rub.  No murmur heard. Pulses:      Dorsalis pedis pulses are 2+ on the right side, and 2+ on the left side.  No edema  Pulmonary/Chest: Effort normal and breath sounds normal. No accessory muscle usage. No respiratory distress. He has no decreased breath sounds. He has no wheezes. He has no rhonchi. He has no rales. He exhibits no tenderness.  Abdominal: Soft. Normal appearance and bowel sounds are normal. He exhibits no distension and no ascites. There is no tenderness.  Musculoskeletal: Normal range of motion. He exhibits no edema or tenderness.  Expected osteoarthritis, stiffness; Bilateral Calves soft, supple. Negative Homan's Sign. B- pedal pulses equal; generalized weakness, ambulatory with walker  Neurological: He is alert. He has normal strength. He displays atrophy. A sensory deficit is present. Coordination and gait abnormal.  Skin: Skin is warm, dry and intact. He is not diaphoretic. No cyanosis. No pallor. Nails show no clubbing.  Psychiatric: Thought content normal. His affect is blunt. His speech is delayed. He is slowed and withdrawn. Cognition and memory are impaired. He expresses impulsivity. He exhibits abnormal recent memory and abnormal remote memory.  Nursing note and vitals reviewed.   Labs reviewed: Recent Labs    04/26/16 0721 10/28/16 0510  NA 137 139  K 3.8 3.6  CL 105 109  CO2 26 24  GLUCOSE 118* 105*  BUN 19 21*  CREATININE 0.71 0.90  CALCIUM 8.6* 8.5*  MG  --  2.2     Recent Labs    10/28/16 0510  AST 15  ALT 14*  ALKPHOS 47  BILITOT 0.6  PROT 6.3*  ALBUMIN 3.3*   Recent Labs    04/26/16 0721 10/28/16 0510  WBC 10.9* 6.5  NEUTROABS  --  4.0  HGB 12.8* 12.0*  HCT 37.7* 35.0*  MCV 91.2 91.7  PLT 242 215   Lab Results  Component Value  Date   TSH 1.698 10/28/2016   No results found for: HGBA1C No results found for: CHOL, HDL, LDLCALC, LDLDIRECT, TRIG, CHOLHDL  Significant Diagnostic Results in last 30 days:  No results found.  Assessment/Plan Kohl was seen today for medical management of chronic issues.  Diagnoses and all orders for this visit:  Essential (primary) hypertension  Obstructive sleep apnea (adult) (pediatric)  Oropharyngeal dysphagia   above listed conditions stable  Continue current medication regimen  Monitor hor hypotension  Monitor for choking or aspiration  Pt to sit upright with meals  Assist with meals/prep as necessary  BID mouth care  Family/ staff Communication:   Total Time:  Documentation:  Face to Face:  Family/Phone:   Labs/tests ordered:  Not due  Medication list reviewed and assessed for continued appropriateness. Monthly medication orders reviewed and signed.  Vikki Ports, NP-C [facility] [phone], [place] [address] Cell Phone (Mon-Fri 8am-5pm):  [phone] On Call:  [phone] & follow prompts after 5pm & weekends Office Phone:  [phone] Office Fax:  [phone]

## 2017-03-11 ENCOUNTER — Encounter
Admission: RE | Admit: 2017-03-11 | Discharge: 2017-03-11 | Disposition: A | Discharge status: Home / Self Care | Payer: Medicare Other | Source: Ambulatory Visit | Attending: Internal Medicine | Admitting: Internal Medicine

## 2017-03-24 ENCOUNTER — Encounter: Payer: Self-pay | Admitting: Gerontology

## 2017-03-24 ENCOUNTER — Non-Acute Institutional Stay: Payer: Medicare Other | Admitting: Gerontology

## 2017-03-24 DIAGNOSIS — G301 Alzheimer's disease with late onset: Secondary | ICD-10-CM

## 2017-03-24 DIAGNOSIS — F0280 Dementia in other diseases classified elsewhere without behavioral disturbance: Secondary | ICD-10-CM | POA: Diagnosis not present

## 2017-03-24 DIAGNOSIS — J309 Allergic rhinitis, unspecified: Secondary | ICD-10-CM

## 2017-03-29 DIAGNOSIS — H401133 Primary open-angle glaucoma, bilateral, severe stage: Secondary | ICD-10-CM | POA: Diagnosis not present

## 2017-04-01 DIAGNOSIS — F0280 Dementia in other diseases classified elsewhere without behavioral disturbance: Secondary | ICD-10-CM | POA: Diagnosis not present

## 2017-04-01 DIAGNOSIS — F325 Major depressive disorder, single episode, in full remission: Secondary | ICD-10-CM | POA: Diagnosis not present

## 2017-04-01 DIAGNOSIS — I10 Essential (primary) hypertension: Secondary | ICD-10-CM | POA: Diagnosis not present

## 2017-04-01 DIAGNOSIS — I5032 Chronic diastolic (congestive) heart failure: Secondary | ICD-10-CM | POA: Diagnosis not present

## 2017-04-01 DIAGNOSIS — G301 Alzheimer's disease with late onset: Secondary | ICD-10-CM | POA: Diagnosis not present

## 2017-04-05 DIAGNOSIS — F0280 Dementia in other diseases classified elsewhere without behavioral disturbance: Secondary | ICD-10-CM | POA: Diagnosis not present

## 2017-04-05 DIAGNOSIS — G301 Alzheimer's disease with late onset: Secondary | ICD-10-CM | POA: Diagnosis not present

## 2017-04-05 DIAGNOSIS — F5104 Psychophysiologic insomnia: Secondary | ICD-10-CM | POA: Diagnosis not present

## 2017-04-08 ENCOUNTER — Encounter
Admission: RE | Admit: 2017-04-08 | Discharge: 2017-04-08 | Disposition: A | Discharge status: Home / Self Care | Payer: Medicare Other | Source: Ambulatory Visit | Attending: Internal Medicine | Admitting: Internal Medicine

## 2017-04-28 ENCOUNTER — Encounter: Payer: Self-pay | Admitting: Gerontology

## 2017-04-28 ENCOUNTER — Non-Acute Institutional Stay: Payer: Medicare Other | Admitting: Gerontology

## 2017-04-28 DIAGNOSIS — M47812 Spondylosis without myelopathy or radiculopathy, cervical region: Secondary | ICD-10-CM | POA: Diagnosis not present

## 2017-04-28 DIAGNOSIS — N401 Enlarged prostate with lower urinary tract symptoms: Secondary | ICD-10-CM

## 2017-04-28 DIAGNOSIS — N138 Other obstructive and reflux uropathy: Secondary | ICD-10-CM

## 2017-04-28 DIAGNOSIS — M1990 Unspecified osteoarthritis, unspecified site: Secondary | ICD-10-CM | POA: Diagnosis not present

## 2017-05-03 NOTE — Assessment & Plan Note (Signed)
Stable.  Symptoms controlled with Nasonex 1 spray into each nostril each morning and Astepro 0.15% solution 2 sprays at bedtime no recent complaints of worsening symptoms of nasal congestion or rhinorrhea

## 2017-05-03 NOTE — Progress Notes (Signed)
Location:    Nursing Home Room Number: 062I Place of Service:  SNF (31) Provider:  Toni Arthurs, NP-C  Kirk Ruths, MD  Patient Care Team: Kirk Ruths, MD as PCP - General (Internal Medicine)  Extended Emergency Contact Information Primary Emergency Contact: [facility] Address: [address] [place] [address] [name] of [place] Phone: [phone] Work Phone: [phone] Mobile Phone: [phone] Relation: Son Secondary Emergency Contact: Nelia Shi Address: [address]          [place], [place] [address] [name] of [place] Phone: [phone] Work Phone: [phone] Mobile Phone: [phone] Relation: Daughter  Code Status: DNR Goals of care: Advanced Directive information Advanced Directives 04/28/2017  Does Patient Have a Medical Advance Directive? Yes  Type of Advance Directive Out of facility DNR (pink MOST or yellow form)  Does patient want to make changes to medical advance directive? No - Patient declined  Copy of [place] in Chart? -  Pre-existing out of facility DNR order (yellow form or pink MOST form) Yellow form placed in chart (order not valid for inpatient use)     Chief Complaint  Patient presents with  . Medical Management of Chronic Issues    Routine Visit    HPI:  Pt is a 84 y.o. male seen today for medical management of chronic diseases.    Allergic rhinitis Stable.  Symptoms controlled with Nasonex 1 spray into each nostril each morning and Astepro 0.15% solution 2 sprays at bedtime no recent complaints of worsening symptoms of nasal congestion or rhinorrhea  Dementia in other diseases classified elsewhere without behavioral disturbance Stable.  Patient is a long-term resident on the assisted living/memory care unit.  Patient receives Namenda 5 mg p.o. twice daily for dementia.  GDR of Namenda was attempted with patient having worsening symptoms of agitation.   Medication restarted with symptom improvement.  Alzheimer's disease Patient is well adjusted on the memory care unit.  Patient continues to feed himself and needs some assistance with ADLs.  Patient does have issues with wandering at times but is redirectable.  Please note pt with limited verbal ability. Unable to obtain complete ROS. Some ROS info obtained from staff and documentation.   Past Medical History:  Diagnosis Date  . Anemia   . Benign prostatic hyperplasia   . BPH without obstruction/lower urinary tract symptoms 03/27/2012  . Cervical vertebral fracture (HCC)    C7 with small subdural hematoma  . Dementia   . Depression 02/05/2015   unspecified  . Dysphagia   . Facial fracture ([place])   . Falling   . Glaucoma   . Hypertension   . Intracranial injury ([place])   . Macular degeneration    early/dry  . Malignant neoplasm of prostate ([place]) 05/22/2012  . Osteoarthritis   . POAG (primary open-angle glaucoma)   . Sleep apnea 02/04/2015  . Spondylosis of cervical joint   . Subdural hematoma ([place])   . Traumatic brain injury ([place])   . Urge incontinence 03/27/2012   Past Surgical History:  Procedure Laterality Date  . APPENDECTOMY    . AQUEOUS SHUNT Right 03/17/2015   Procedure:  . CHOLECYSTECTOMY     x 2  . GALLBLADDER SURGERY    . GLAUCOMA SURGERY Right 12/2006  . HERNIA REPAIR    . LENS SURGERY Right 12/29/2010   With i-stent  . LENS SURGERY Left 02/28/2011   With i-stent  . TONSILLECTOMY    .  TURP VAPORIZATION      No Known Allergies  Allergies as of 03/24/2017   No Known Allergies     Medication List        Accurate as of 03/24/17 11:59 PM. Always use your most recent med list.          amLODipine 5 MG tablet Commonly known as:  NORVASC Take 5 mg by mouth daily.   ASPERCREME W/LIDOCAINE 4 % cream Generic drug:  lidocaine Apply 1 application topically 3 (three) times daily as needed. Apply thin film to lumbo-sacral area for pain   aspirin EC 81  MG tablet Take 81 mg by mouth daily.   ASTEPRO 0.15 % Soln Generic drug:  Azelastine HCl Place 2 sprays into both nostrils at bedtime.   brimonidine 0.2 % ophthalmic solution Commonly known as:  ALPHAGAN Place 1 drop into both eyes 2 (two) times daily. Per Dr. Laveda Norman, MD   carvedilol 6.25 MG tablet Commonly known as:  COREG Take 6.25 mg by mouth every 12 (twelve) hours.   citalopram 20 MG tablet Commonly known as:  CELEXA Take 20 mg by mouth daily.   coal tar 0.5 % shampoo Commonly known as:  NEUTROGENA T-GEL Apply 1 application topically once a week. On thursdays   dorzolamide-timolol 22.3-6.8 MG/ML ophthalmic solution Commonly known as:  COSOPT Place 1 drop into both eyes 2 (two) times daily. Per Dr. Arlis Porta. Wait 5 mins between admin of multiple eye drops   ENDIT EX Apply liberal amount topically to areas of skin irritation as needed. Ok to leave at bedside.   folic acid 629 MCG tablet Commonly known as:  FOLVITE Take 400 mcg by mouth daily.   loperamide 2 MG capsule Commonly known as:  IMODIUM Give 4 mg by mouth prn for loose stools and 2 mg after each subsequent loose stool up to 8 doses in 24 hours   magnesium hydroxide 400 MG/5ML suspension Commonly known as:  MILK OF MAGNESIA Take 30 mLs by mouth daily as needed for mild constipation. For constipation no results in 24 hours may administer bisacodyl supp   MAPAP ARTHRITIS PAIN 650 MG CR tablet Generic drug:  acetaminophen Take 1,300 mg by mouth 2 (two) times daily.   memantine 5 MG tablet Commonly known as:  NAMENDA Take 5 mg by mouth 2 (two) times daily.   mometasone 50 MCG/ACT nasal spray Commonly known as:  NASONEX Place 2 sprays into the nose every morning.   potassium chloride 10 MEQ CR capsule Commonly known as:  MICRO-K Take 10 mEq by mouth 2 (two) times daily.   sennosides-docusate sodium 8.6-50 MG tablet Commonly known as:  SENOKOT-S Take 1 tablet by mouth at bedtime.   tamsulosin 0.4 MG  Caps capsule Commonly known as:  FLOMAX Take 0.4 mg by mouth daily.   traMADol 50 MG tablet Commonly known as:  ULTRAM Take 50-100 mg by mouth every 4 (four) hours as needed for moderate pain or severe pain.   traZODone 50 MG tablet Commonly known as:  DESYREL Take 25 mg by mouth at bedtime. 1/2 tab. Ok to hold if pt is sleeping   vitamin B-12 1000 MCG tablet Commonly known as:  CYANOCOBALAMIN Take 1,000 mcg by mouth daily.   Vitamin D3 2000 units capsule Take 2,000 Units by mouth daily.       Review of Systems  Unable to perform ROS: Dementia  Constitutional: Negative for activity change, appetite change, chills, diaphoresis and fever.  HENT: Negative for congestion,  mouth sores, nosebleeds, postnasal drip, sneezing, sore throat, trouble swallowing and voice change.   Respiratory: Negative for apnea, cough, choking, chest tightness, shortness of breath and wheezing.   Cardiovascular: Negative for chest pain, palpitations and leg swelling.  Gastrointestinal: Negative for abdominal distention, abdominal pain, constipation, diarrhea and nausea.  Genitourinary: Negative for difficulty urinating, dysuria, frequency and urgency.  Musculoskeletal: Positive for arthralgias (typical arthritis). Negative for back pain, gait problem and myalgias.  Skin: Negative for color change, pallor, rash and wound.  Neurological: Positive for weakness. Negative for dizziness, tremors, syncope, speech difficulty, numbness and headaches.  Psychiatric/Behavioral: Positive for confusion and dysphoric mood. Negative for agitation and behavioral problems.  All other systems reviewed and are negative.   Immunization History  Administered Date(s) Administered  . Influenza-Unspecified 11/14/2015, 11/02/2016  . PPD Test 03/05/2016   Pertinent  Health Maintenance Due  Topic Date Due  . PNA vac Low Risk Adult (1 of 2 - PCV13) 02/23/1992  . INFLUENZA VACCINE  Completed   No flowsheet data  found. Functional Status Survey:    Vitals:   03/24/17 1147  BP: (!) 173/73  Pulse: 69  Resp: 18  Temp: (!) 97.4 F (36.3 C)  TempSrc: Oral  SpO2: 96%  Weight: 169 lb 9.6 oz (76.9 kg)  Height: 5\' 5"  (1.651 m)   Body mass index is 28.22 kg/m. Physical Exam  Constitutional: Vital signs are normal. He appears well-developed and well-nourished. He is active and cooperative. He does not appear ill. No distress.  HENT:  Head: Normocephalic and atraumatic.  Mouth/Throat: Uvula is midline, oropharynx is clear and moist and mucous membranes are normal. Mucous membranes are not pale, not dry and not cyanotic.  Eyes: Pupils are equal, round, and reactive to light. Conjunctivae, EOM and lids are normal.  Neck: Trachea normal, normal range of motion and full passive range of motion without pain. Neck supple. No JVD present. No tracheal deviation, no edema and no erythema present. No thyromegaly present.  Cardiovascular: Normal rate, regular rhythm, intact distal pulses and normal pulses. Exam reveals no gallop, no distant heart sounds and no friction rub.  Murmur heard. Pulses:      Dorsalis pedis pulses are 2+ on the right side, and 2+ on the left side.  No edema  Pulmonary/Chest: Effort normal and breath sounds normal. No accessory muscle usage. No respiratory distress. He has no decreased breath sounds. He has no wheezes. He has no rhonchi. He has no rales. He exhibits no tenderness.  Abdominal: Soft. Normal appearance and bowel sounds are normal. He exhibits no distension and no ascites. There is no tenderness.  Musculoskeletal: Normal range of motion. He exhibits no edema or tenderness.  Expected osteoarthritis, stiffness; Bilateral Calves soft, supple. Negative Homan's Sign. B- pedal pulses equal; generalized weakness ambulates with rolling walker  Neurological: He is alert. He has normal strength. He displays atrophy. A cranial nerve deficit and sensory deficit is present. He exhibits  abnormal muscle tone. Coordination and gait abnormal.  Skin: Skin is warm, dry and intact. He is not diaphoretic. No cyanosis. No pallor. Nails show no clubbing.  Psychiatric: His speech is normal. Thought content normal. His affect is blunt. He is withdrawn. Cognition and memory are impaired. He expresses impulsivity and inappropriate judgment. He exhibits abnormal recent memory and abnormal remote memory.  Nursing note and vitals reviewed.   Labs reviewed: Recent Labs    10/28/16 0510  NA 139  K 3.6  CL 109  CO2 24  GLUCOSE  105*  BUN 21*  CREATININE 0.90  CALCIUM 8.5*  MG 2.2   Recent Labs    10/28/16 0510  AST 15  ALT 14*  ALKPHOS 47  BILITOT 0.6  PROT 6.3*  ALBUMIN 3.3*   Recent Labs    10/28/16 0510  WBC 6.5  NEUTROABS 4.0  HGB 12.0*  HCT 35.0*  MCV 91.7  PLT 215   Lab Results  Component Value Date   TSH 1.698 10/28/2016   No results found for: HGBA1C No results found for: CHOL, HDL, LDLCALC, LDLDIRECT, TRIG, CHOLHDL  Significant Diagnostic Results in last 30 days:  No results found.  Assessment/Plan Tony [name] was seen today for medical management of chronic issues.  Diagnoses and all orders for this visit:  Allergic rhinitis, unspecified seasonality, unspecified trigger  Dementia in other diseases classified elsewhere without behavioral disturbance  Late onset Alzheimer's disease without behavioral disturbance   Above listed conditions stable  Continue current medication regimen  Monitor for worsening behaviors or wandering  Safety precautions  Fall risk  Assist with ADLs as appropriate  Family/ staff Communication:  Total Time:  Documentation:  Face to Face:  Family/Phone:   Labs/tests ordered: Not due  Medication list reviewed and assessed for continued appropriateness. Monthly medication orders reviewed and signed.  Vikki Ports, NP-C [facility] [phone], [place] [address] Cell Phone (Mon-Fri 8am-5pm):  [phone] On Call:  [phone] & follow prompts after 5pm & weekends Office Phone:  [phone] Office Fax:  [phone]

## 2017-05-03 NOTE — Assessment & Plan Note (Signed)
Stable.  Patient is a long-term resident on the assisted living/memory care unit.  Patient receives Namenda 5 mg p.o. twice daily for dementia.  GDR of Namenda was attempted with patient having worsening symptoms of agitation.  Medication restarted with symptom improvement.

## 2017-05-03 NOTE — Assessment & Plan Note (Signed)
Stable.  No complaints of urinary retention, frequency, urgency.  Symptoms controlled with Flomax 0.4 mg p.o. daily

## 2017-05-03 NOTE — Assessment & Plan Note (Signed)
Stable.  Pain currently well controlled on current regimen.  No complaints of worsening pain.  Symptoms controlled with Aspercreme 3 times a day as needed Tylenol arthritis 650 mg CR tablets 2 tablets p.o. twice daily

## 2017-05-03 NOTE — Progress Notes (Signed)
Location:    Nursing Home Room Number: 462M Place of Service:  ALF [phone]) Provider:  Toni Arthurs, NP-C  Kirk Ruths, MD  Patient Care Team: Kirk Ruths, MD as PCP - General (Internal Medicine)  Extended Emergency Contact Information Primary Emergency Contact: [facility] Address: [address] [place] [address] [name] of [place] Phone: [phone] Work Phone: [phone] Mobile Phone: [phone] Relation: Son Secondary Emergency Contact: Nelia Shi Address: [address]          [place], [place] [address] [name] of [place] Phone: [phone] Work Phone: [phone] Mobile Phone: [phone] Relation: Daughter  Code Status: DNR Goals of care: Advanced Directive information Advanced Directives 04/28/2017  Does Patient Have a Medical Advance Directive? Yes  Type of Advance Directive Out of facility DNR (pink MOST or yellow form)  Does patient want to make changes to medical advance directive? No - Patient declined  Copy of [place] in Chart? -  Pre-existing out of facility DNR order (yellow form or pink MOST form) Yellow form placed in chart (order not valid for inpatient use)     Chief Complaint  Patient presents with  . Medical Management of Chronic Issues    Routine Visit    HPI:  Pt is a 84 y.o. male seen today for medical management of chronic diseases.    Spondylosis without myelopathy or radiculopathy, cervical region Stable.  Pain currently well controlled on current regimen.  No complaints of worsening pain.  Symptoms controlled with Aspercreme 3 times a day as needed Tylenol arthritis 650 mg CR tablets 2 tablets p.o. twice daily  Osteoarthrosis Controlled.  Symptoms controlled with Tylenol arthritis CR tablets 650 mg - 2 tablets p.o. twice daily and Aspercreme 4% cream 3 times daily as needed.  Patient encouraged to ambulate and increase movement decrease stiffness.   Patient consistently denies pain.  Benign prostatic hyperplasia with urinary obstruction Stable.  No complaints of urinary retention, frequency, urgency.  Symptoms controlled with Flomax 0.4 mg p.o. daily  Please note pt with limited verbal ability. Unable to obtain complete ROS. Some ROS info obtained from staff and documentation.    Past Medical History:  Diagnosis Date  . Anemia   . Benign prostatic hyperplasia   . BPH without obstruction/lower urinary tract symptoms 03/27/2012  . Cervical vertebral fracture (HCC)    C7 with small subdural hematoma  . Dementia   . Depression 02/05/2015   unspecified  . Dysphagia   . Facial fracture ([place])   . Falling   . Glaucoma   . Hypertension   . Intracranial injury ([place])   . Macular degeneration    early/dry  . Malignant neoplasm of prostate ([place]) 05/22/2012  . Osteoarthritis   . POAG (primary open-angle glaucoma)   . Sleep apnea 02/04/2015  . Spondylosis of cervical joint   . Subdural hematoma ([place])   . Traumatic brain injury ([place])   . Urge incontinence 03/27/2012   Past Surgical History:  Procedure Laterality Date  . APPENDECTOMY    . AQUEOUS SHUNT Right 03/17/2015   Procedure:  . CHOLECYSTECTOMY     x 2  . GALLBLADDER SURGERY    . GLAUCOMA SURGERY Right 12/2006  . HERNIA REPAIR    . LENS SURGERY Right 12/29/2010   With i-stent  . LENS SURGERY Left 02/28/2011   With i-stent  . TONSILLECTOMY    . TURP VAPORIZATION  No Known Allergies  Allergies as of 04/28/2017   No Known Allergies     Medication List        Accurate as of 04/28/17 11:59 PM. Always use your most recent med list.          amLODipine 5 MG tablet Commonly known as:  NORVASC Take 5 mg by mouth daily.   ASPERCREME W/LIDOCAINE 4 % cream Generic drug:  lidocaine Apply 1 application topically 3 (three) times daily as needed. Apply thin film to lumbo-sacral area for pain   aspirin EC 81 MG tablet Take 81 mg by mouth daily.   ASTEPRO 0.15  % Soln Generic drug:  Azelastine HCl Place 2 sprays into both nostrils at bedtime.   brimonidine 0.2 % ophthalmic solution Commonly known as:  ALPHAGAN Place 1 drop into both eyes 2 (two) times daily. Per Dr. Laveda Norman, MD   carvedilol 6.25 MG tablet Commonly known as:  COREG Take 6.25 mg by mouth every 12 (twelve) hours.   citalopram 20 MG tablet Commonly known as:  CELEXA Take 20 mg by mouth daily.   coal tar 0.5 % shampoo Commonly known as:  NEUTROGENA T-GEL Apply 1 application topically once a week. On thursdays   dorzolamide-timolol 22.3-6.8 MG/ML ophthalmic solution Commonly known as:  COSOPT Place 1 drop into both eyes 2 (two) times daily. Per Dr. Arlis Porta. Wait 5 mins between admin of multiple eye drops   ENDIT EX Apply liberal amount topically to areas of skin irritation as needed. Ok to leave at bedside.   folic acid 564 MCG tablet Commonly known as:  FOLVITE Take 400 mcg by mouth daily.   loperamide 2 MG capsule Commonly known as:  IMODIUM Give 4 mg by mouth prn for loose stools and 2 mg after each subsequent loose stool up to 8 doses in 24 hours   magnesium hydroxide 400 MG/5ML suspension Commonly known as:  MILK OF MAGNESIA Take 30 mLs by mouth daily as needed for mild constipation. For constipation no results in 24 hours may administer bisacodyl supp   MAPAP ARTHRITIS PAIN 650 MG CR tablet Generic drug:  acetaminophen Take 1,300 mg by mouth 2 (two) times daily.   memantine 5 MG tablet Commonly known as:  NAMENDA Take 5 mg by mouth 2 (two) times daily.   mometasone 50 MCG/ACT nasal spray Commonly known as:  NASONEX Place 2 sprays into the nose every morning.   potassium chloride 10 MEQ CR capsule Commonly known as:  MICRO-K Take 10 mEq by mouth 2 (two) times daily.   sennosides-docusate sodium 8.6-50 MG tablet Commonly known as:  SENOKOT-S Take 1 tablet by mouth at bedtime.   tamsulosin 0.4 MG Caps capsule Commonly known as:  FLOMAX Take 0.4 mg  by mouth daily.   vitamin B-12 1000 MCG tablet Commonly known as:  CYANOCOBALAMIN Take 1,000 mcg by mouth daily.   Vitamin D3 2000 units capsule Take 2,000 Units by mouth daily.       Review of Systems  Unable to perform ROS: Dementia  Constitutional: Negative for activity change, appetite change, chills, diaphoresis and fever.  HENT: Negative for congestion, mouth sores, nosebleeds, postnasal drip, sneezing, sore throat, trouble swallowing and voice change.   Respiratory: Negative for apnea, cough, choking, chest tightness, shortness of breath and wheezing.   Cardiovascular: Negative for chest pain, palpitations and leg swelling.  Gastrointestinal: Negative for abdominal distention, abdominal pain, constipation, diarrhea and nausea.  Genitourinary: Negative for difficulty urinating, dysuria, frequency and urgency.  Musculoskeletal: Positive for arthralgias (typical arthritis). Negative for back pain, gait problem and myalgias.  Skin: Negative for color change, pallor, rash and wound.  Neurological: Negative for dizziness, tremors, syncope, speech difficulty, weakness, numbness and headaches.  Psychiatric/Behavioral: Positive for confusion. Negative for agitation and behavioral problems.  All other systems reviewed and are negative.   Immunization History  Administered Date(s) Administered  . Influenza-Unspecified 11/14/2015, 11/02/2016  . PPD Test 03/05/2016   Pertinent  Health Maintenance Due  Topic Date Due  . PNA vac Low Risk Adult (1 of 2 - PCV13) 02/23/1992  . INFLUENZA VACCINE  Completed   No flowsheet data found. Functional Status Survey:    Vitals:   04/28/17 1145  BP: (!) 182/67  Pulse: 63  Resp: 18  Temp: (!) 97 F (36.1 C)  TempSrc: Oral  SpO2: 96%  Weight: 169 lb 14.4 oz (77.1 kg)  Height: 5' 5"  (1.651 m)   Body mass index is 28.27 kg/m. Physical Exam  Constitutional: Vital signs are normal. He appears well-developed and well-nourished. He is  active and cooperative. He does not appear ill. No distress.  HENT:  Head: Normocephalic and atraumatic.  Mouth/Throat: Uvula is midline, oropharynx is clear and moist and mucous membranes are normal. Mucous membranes are not pale, not dry and not cyanotic.  Eyes: Pupils are equal, round, and reactive to light. Conjunctivae, EOM and lids are normal.  Neck: Trachea normal, normal range of motion and full passive range of motion without pain. Neck supple. No JVD present. No tracheal deviation, no edema and no erythema present. No thyromegaly present.  Cardiovascular: Normal rate, regular rhythm, intact distal pulses and normal pulses. Exam reveals no gallop, no distant heart sounds and no friction rub.  Murmur heard. Pulses:      Dorsalis pedis pulses are 2+ on the right side, and 2+ on the left side.  No edema  Pulmonary/Chest: Effort normal and breath sounds normal. No accessory muscle usage. No respiratory distress. He has no decreased breath sounds. He has no wheezes. He has no rhonchi. He has no rales. He exhibits no tenderness.  Abdominal: Soft. Normal appearance and bowel sounds are normal. He exhibits no distension and no ascites. There is no tenderness.  Musculoskeletal: Normal range of motion. He exhibits no edema or tenderness.  Expected osteoarthritis, stiffness; Bilateral Calves soft, supple. Negative Homan's Sign. B- pedal pulses equal; generalized weakness, ambulatory on unit with rolling walker  Neurological: He is alert. He has normal strength. He displays atrophy. A cranial nerve deficit and sensory deficit is present. He exhibits abnormal muscle tone. Coordination and gait abnormal.  Skin: Skin is warm, dry and intact. He is not diaphoretic. No cyanosis. No pallor. Nails show no clubbing.  Psychiatric: His speech is normal. Thought content normal. His affect is blunt. He is withdrawn. Cognition and memory are impaired. He expresses impulsivity. He exhibits abnormal recent memory and  abnormal remote memory.  Nursing note and vitals reviewed.   Labs reviewed: Recent Labs    10/28/16 0510  NA 139  K 3.6  CL 109  CO2 24  GLUCOSE 105*  BUN 21*  CREATININE 0.90  CALCIUM 8.5*  MG 2.2   Recent Labs    10/28/16 0510  AST 15  ALT 14*  ALKPHOS 47  BILITOT 0.6  PROT 6.3*  ALBUMIN 3.3*   Recent Labs    10/28/16 0510  WBC 6.5  NEUTROABS 4.0  HGB 12.0*  HCT 35.0*  MCV 91.7  PLT 215  Lab Results  Component Value Date   TSH 1.698 10/28/2016   No results found for: HGBA1C No results found for: CHOL, HDL, LDLCALC, LDLDIRECT, TRIG, CHOLHDL  Significant Diagnostic Results in last 30 days:  No results found.  Assessment/Plan Wirt was seen today for medical management of chronic issues.  Diagnoses and all orders for this visit:  Spondylosis without myelopathy or radiculopathy, cervical region  Osteoarthritis, unspecified osteoarthritis type, unspecified site  Benign prostatic hyperplasia with urinary obstruction   Above listed conditions stable  Continue current medication regimen  Continue to encourage interaction with other residents and participation in activities  Continue to encourage ambulation and movement  Monitor for worsening pain and pain symptoms  Monitor for urinary retention  Labs prior to next month assessment  Family/ staff Communication:   Total Time:  Documentation:  Face to Face:  Family/Phone:   Labs/tests ordered: C, met C, magnesium, vitamin D, vitamin B12, TSH prior to next month visit  Medication list reviewed and assessed for continued appropriateness. Monthly medication orders reviewed and signed.  Vikki Ports, NP-C Geriatrics [facility] [phone] [address] Cell Phone (Mon-Fri 8am-5pm):  [phone] On Call:  [phone] & follow prompts after 5pm & weekends Office Phone:  [phone] Office Fax:  [phone]

## 2017-05-03 NOTE — Assessment & Plan Note (Signed)
Controlled.  Symptoms controlled with Tylenol arthritis CR tablets 650 mg - 2 tablets p.o. twice daily and Aspercreme 4% cream 3 times daily as needed.  Patient encouraged to ambulate and increase movement decrease stiffness.  Patient consistently denies pain.

## 2017-05-03 NOTE — Assessment & Plan Note (Signed)
Patient is well adjusted on the memory care unit.  Patient continues to feed himself and needs some assistance with ADLs.  Patient does have issues with wandering at times but is redirectable.

## 2017-05-05 ENCOUNTER — Other Ambulatory Visit
Admission: RE | Admit: 2017-05-05 | Discharge: 2017-05-05 | Disposition: A | Discharge status: Home / Self Care | Payer: Medicare Other | Source: Ambulatory Visit | Attending: Gerontology | Admitting: Gerontology

## 2017-05-05 DIAGNOSIS — I10 Essential (primary) hypertension: Secondary | ICD-10-CM | POA: Insufficient documentation

## 2017-05-05 LAB — CBC WITH DIFFERENTIAL/PLATELET
Basophils Absolute: 0 10*3/uL (ref 0–0.1)
Basophils Relative: 0 %
EOS PCT: 4 %
Eosinophils Absolute: 0.2 10*3/uL (ref 0–0.7)
HCT: 35.5 % — ABNORMAL LOW (ref 40.0–52.0)
HEMOGLOBIN: 11.9 g/dL — AB (ref 13.0–18.0)
LYMPHS ABS: 1.6 10*3/uL (ref 1.0–3.6)
Lymphocytes Relative: 26 %
MCH: 31 pg (ref 26.0–34.0)
MCHC: 33.4 g/dL (ref 32.0–36.0)
MCV: 92.8 fL (ref 80.0–100.0)
Monocytes Absolute: 0.7 10*3/uL (ref 0.2–1.0)
Monocytes Relative: 11 %
Neutro Abs: 3.8 10*3/uL (ref 1.4–6.5)
Neutrophils Relative %: 59 %
PLATELETS: 241 10*3/uL (ref 150–440)
RBC: 3.83 MIL/uL — AB (ref 4.40–5.90)
RDW: 14.2 % (ref 11.5–14.5)
WBC: 6.4 10*3/uL (ref 3.8–10.6)

## 2017-05-05 LAB — VITAMIN B12: Vitamin B-12: 991 pg/mL — ABNORMAL HIGH (ref 180–914)

## 2017-05-05 LAB — COMPREHENSIVE METABOLIC PANEL
ALT: 17 U/L (ref 17–63)
AST: 18 U/L (ref 15–41)
Albumin: 3.4 g/dL — ABNORMAL LOW (ref 3.5–5.0)
Alkaline Phosphatase: 51 U/L (ref 38–126)
Anion gap: 9 (ref 5–15)
BILIRUBIN TOTAL: 0.6 mg/dL (ref 0.3–1.2)
BUN: 21 mg/dL — AB (ref 6–20)
CO2: 23 mmol/L (ref 22–32)
CREATININE: 0.99 mg/dL (ref 0.61–1.24)
Calcium: 8.5 mg/dL — ABNORMAL LOW (ref 8.9–10.3)
Chloride: 108 mmol/L (ref 101–111)
GFR calc Af Amer: >60 mL/min (ref >60)
Glucose, Bld: 96 mg/dL (ref 65–99)
Potassium: 3.6 mmol/L (ref 3.5–5.1)
Sodium: 140 mmol/L (ref 135–145)
TOTAL PROTEIN: 6.2 g/dL — AB (ref 6.5–8.1)

## 2017-05-05 LAB — MAGNESIUM: Magnesium: 2.3 mg/dL (ref 1.7–2.4)

## 2017-05-05 LAB — TSH: TSH: 2.221 u[IU]/mL (ref 0.350–4.500)

## 2017-05-06 LAB — VITAMIN D 25 HYDROXY (VIT D DEFICIENCY, FRACTURES): VIT D 25 HYDROXY: 23.1 ng/mL — AB (ref 30.0–100.0)

## 2017-05-09 ENCOUNTER — Encounter
Admission: RE | Admit: 2017-05-09 | Discharge: 2017-05-09 | Disposition: A | Discharge status: Home / Self Care | Payer: Medicare Other | Source: Ambulatory Visit | Attending: Internal Medicine | Admitting: Internal Medicine

## 2017-05-26 ENCOUNTER — Encounter: Payer: Self-pay | Admitting: Gerontology

## 2017-05-26 ENCOUNTER — Non-Acute Institutional Stay: Payer: Medicare Other | Admitting: Gerontology

## 2017-05-26 DIAGNOSIS — H4010X0 Unspecified open-angle glaucoma, stage unspecified: Secondary | ICD-10-CM | POA: Diagnosis not present

## 2017-05-26 DIAGNOSIS — E559 Vitamin D deficiency, unspecified: Secondary | ICD-10-CM

## 2017-05-26 DIAGNOSIS — E538 Deficiency of other specified B group vitamins: Secondary | ICD-10-CM | POA: Diagnosis not present

## 2017-05-26 NOTE — Progress Notes (Signed)
Location:    Nursing Home Room Number: 371 Place of Service:  ALF [phone]) Provider:  Toni Arthurs, NP-C  Kirk Ruths, MD  Patient Care Team: Kirk Ruths, MD as PCP - General (Internal Medicine)  Extended Emergency Contact Information Primary Emergency Contact: [facility] Address: [address] [place] [address] [name] of [place] Phone: [phone] Work Phone: [phone] Mobile Phone: [phone] Relation: Son Secondary Emergency Contact: Nelia Shi Address: [address]          [place], [place] [address] [name] of [place] Phone: [phone] Work Phone: [phone] Mobile Phone: [phone] Relation: Daughter  Code Status:  DNR Goals of care: Advanced Directive information Advanced Directives 05/26/2017  Does Patient Have a Medical Advance Directive? Yes  Type of Advance Directive Out of facility DNR (pink MOST or yellow form)  Does patient want to make changes to medical advance directive? No - Patient declined  Copy of [place] in Chart? -  Pre-existing out of facility DNR order (yellow form or pink MOST form) Yellow form placed in chart (order not valid for inpatient use)     Chief Complaint  Patient presents with  . Medical Management of Chronic Issues    Routine Visit    HPI:  Pt is a 84 y.o. male seen today for medical management of chronic diseases.    Vitamin D deficiency Now Stable. Currently on Cholecalciferol 4,000 units po Q Day. On recent labs, level was 23.1 on 2,000 units. Dose increased. Will recheck D level on next routine lab draw.  Vitamin B12 deficiency Stable. Recent level 991 on 1,000 mcg po Q Day. Continue current dose.   Open-angle glaucoma Stable. Pt is followed routinely by Ophthamologist. On Cospot 22.3-6.8 mg- 1 drop both eyes BID and Alphagan 0.2% 1 drop BID. No c/o eye pain  Please note pt with limited verbal/cognitive ability. Unable to obtain  complete ROS. Some ROS info obtained from staff and documentation.   Past Medical History:  Diagnosis Date  . Anemia   . Benign prostatic hyperplasia   . BPH without obstruction/lower urinary tract symptoms 03/27/2012  . Cervical vertebral fracture (HCC)    C7 with small subdural hematoma  . Dementia   . Depression 02/05/2015   unspecified  . Dysphagia   . Facial fracture ([place])   . Falling   . Glaucoma   . Hypertension   . 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 Macular degeneration    early/dry  . Malignant neoplasm of prostate ([place]) 05/22/2012  . Osteoarthritis   . POAG (primary open-angle glaucoma)   . Sleep apnea 02/04/2015  . Spondylosis of cervical joint   . Subdural hematoma ([place])   . Traumatic brain injury ([place])   . Urge incontinence 03/27/2012   Past Surgical History:  Procedure Laterality Date  . APPENDECTOMY    . AQUEOUS SHUNT Right 03/17/2015   Procedure:  . CHOLECYSTECTOMY     x 2  . GALLBLADDER SURGERY    . GLAUCOMA SURGERY Right 12/2006  . HERNIA REPAIR    . LENS SURGERY Right 12/29/2010   With i-stent  . LENS SURGERY Left 02/28/2011   With i-stent  . TONSILLECTOMY    . TURP VAPORIZATION      No Known Allergies  Allergies as of 05/26/2017   No Known Allergies     Medication List        Accurate as of 05/26/17 11:02 PM.  Always use your most recent med list.          amLODipine 5 MG tablet Commonly known as:  NORVASC Take 5 mg by mouth daily.   ASPERCREME W/LIDOCAINE 4 % cream Generic drug:  lidocaine Apply 1 application topically 3 (three) times daily as needed. Apply thin film to lumbo-sacral area for pain   aspirin EC 81 MG tablet Take 81 mg by mouth daily.   ASTEPRO 0.15 % Soln Generic drug:  Azelastine HCl Place 2 sprays into both nostrils at bedtime.   brimonidine 0.2 % ophthalmic solution Commonly known as:  ALPHAGAN Place 1 drop into both eyes 2 (two) times daily. Per Dr. Laveda Norman, MD   carvedilol 6.25 MG tablet Commonly known  as:  COREG Take 6.25 mg by mouth every 12 (twelve) hours.   citalopram 20 MG tablet Commonly known as:  CELEXA Take 20 mg by mouth daily.   coal tar 0.5 % shampoo Commonly known as:  NEUTROGENA T-GEL Apply 1 application topically once a week. On thursdays   dorzolamide-timolol 22.3-6.8 MG/ML ophthalmic solution Commonly known as:  COSOPT Place 1 drop into both eyes 2 (two) times daily. Per Dr. Arlis Porta. Wait 5 mins between admin of multiple eye drops   ENDIT EX Apply liberal amount topically to areas of skin irritation as needed. Ok to leave at bedside.   folic acid 443 MCG tablet Commonly known as:  FOLVITE Take 400 mcg by mouth daily.   loperamide 2 MG capsule Commonly known as:  IMODIUM Give 4 mg by mouth prn for loose stools and 2 mg after each subsequent loose stool up to 8 doses in 24 hours   magnesium hydroxide 400 MG/5ML suspension Commonly known as:  MILK OF MAGNESIA Take 30 mLs by mouth daily as needed for mild constipation. For constipation no results in 24 hours may administer bisacodyl supp   MAPAP ARTHRITIS PAIN 650 MG CR tablet Generic drug:  acetaminophen Take 1,300 mg by mouth 2 (two) times daily.   memantine 5 MG tablet Commonly known as:  NAMENDA Take 5 mg by mouth 2 (two) times daily.   mometasone 50 MCG/ACT nasal spray Commonly known as:  NASONEX Place 2 sprays into the nose every morning.   potassium chloride 10 MEQ CR capsule Commonly known as:  MICRO-K Take 10 mEq by mouth 2 (two) times daily.   sennosides-docusate sodium 8.6-50 MG tablet Commonly known as:  SENOKOT-S Take 1 tablet by mouth at bedtime.   tamsulosin 0.4 MG Caps capsule Commonly known as:  FLOMAX Take 0.4 mg by mouth daily.   traMADol 50 MG tablet Commonly known as:  ULTRAM Take 50-100 mg by mouth every 4 (four) hours as needed. pain; 1- moderate, 2- severe pain   traZODone 50 MG tablet Commonly known as:  DESYREL Take 25 mg by mouth at bedtime. 1/2 tab  for insomnia.  OK to hold if pt is sleeping   vitamin B-12 1000 MCG tablet Commonly known as:  CYANOCOBALAMIN Take 1,000 mcg by mouth daily.   Vitamin D3 2000 units capsule Take 4,000 Units by mouth daily. 2 caps       Review of Systems  Unable to perform ROS: Dementia  Constitutional: Negative for activity change, appetite change, chills, diaphoresis and fever.  HENT: Negative for congestion, mouth sores, nosebleeds, postnasal drip, sneezing, sore throat, trouble swallowing and voice change.   Respiratory: Negative for apnea, cough, choking, chest tightness, shortness of breath and wheezing.   Cardiovascular: Negative for chest  pain, palpitations and leg swelling.  Gastrointestinal: Negative for abdominal distention, abdominal pain, constipation, diarrhea and nausea.  Genitourinary: Negative for difficulty urinating, dysuria, frequency and urgency.  Musculoskeletal: Positive for arthralgias (typical arthritis). Negative for back pain, gait problem and myalgias.  Skin: Negative for color change, pallor, rash and wound.  Neurological: Positive for weakness. Negative for dizziness, tremors, syncope, speech difficulty, numbness and headaches.  Psychiatric/Behavioral: Positive for confusion. Negative for agitation and behavioral problems.  All other systems reviewed and are negative.   Immunization History  Administered Date(s) Administered  . Influenza-Unspecified 11/14/2015, 11/02/2016  . PPD Test 03/05/2016   Pertinent  Health Maintenance Due  Topic Date Due  . PNA vac Low Risk Adult (1 of 2 - PCV13) 02/23/1992  . INFLUENZA VACCINE  09/08/2017   No flowsheet data found. Functional Status Survey:    Vitals:   05/26/17 1134  BP: (!) 173/66  Pulse: 61  Resp: 20  Temp: (!) 97 F (36.1 C)  TempSrc: Oral  SpO2: 98%  Weight: 169 lb (76.7 kg)  Height: 5\' 5"  (1.651 m)   Body mass index is 28.12 kg/m. Physical Exam  Constitutional: Vital signs are normal. He appears well-developed and  well-nourished. He is active and cooperative. He does not appear ill. No distress.  HENT:  Head: Normocephalic and atraumatic.  Mouth/Throat: Uvula is midline, oropharynx is clear and moist and mucous membranes are normal. Mucous membranes are not pale, not dry and not cyanotic.  Eyes: Pupils are equal, round, and reactive to light. Conjunctivae, EOM and lids are normal.  Neck: Trachea normal, normal range of motion and full passive range of motion without pain. Neck supple. No JVD present. No tracheal deviation, no edema and no erythema present. No thyromegaly present.  Cardiovascular: Normal rate, regular rhythm, normal heart sounds, intact distal pulses and normal pulses. Exam reveals no gallop, no distant heart sounds and no friction rub.  No murmur heard. Pulses:      Dorsalis pedis pulses are 2+ on the right side, and 2+ on the left side.  No edema  Pulmonary/Chest: Effort normal and breath sounds normal. No accessory muscle usage. No respiratory distress. He has no decreased breath sounds. He has no wheezes. He has no rhonchi. He has no rales. He exhibits no tenderness.  Abdominal: Soft. Normal appearance and bowel sounds are normal. He exhibits no distension and no ascites. There is no tenderness.  Musculoskeletal: Normal range of motion. He exhibits no edema or tenderness.  Expected osteoarthritis, stiffness; Bilateral Calves soft, supple. Negative Homan's Sign. B- pedal pulses equal; generalized weakness, ambulatory with walker  Neurological: He is alert. He has normal strength. He exhibits abnormal muscle tone. Gait abnormal.  Skin: Skin is warm, dry and intact. He is not diaphoretic. No cyanosis. No pallor. Nails show no clubbing.  Psychiatric: His speech is normal. Judgment and thought content normal. His affect is blunt. He is slowed and withdrawn. Cognition and memory are impaired. He exhibits abnormal recent memory and abnormal remote memory. He is inattentive.  Nursing note and  vitals reviewed.   Labs reviewed: Recent Labs    10/28/16 0510 05/05/17 0838  NA 139 140  K 3.6 3.6  CL 109 108  CO2 24 23  GLUCOSE 105* 96  BUN 21* 21*  CREATININE 0.90 0.99  CALCIUM 8.5* 8.5*  MG 2.2 2.3   Recent Labs    10/28/16 0510 05/05/17 0838  AST 15 18  ALT 14* 17  ALKPHOS 47 51  BILITOT  0.6 0.6  PROT 6.3* 6.2*  ALBUMIN 3.3* 3.4*   Recent Labs    10/28/16 0510 05/05/17 0838  WBC 6.5 6.4  NEUTROABS 4.0 3.8  HGB 12.0* 11.9*  HCT 35.0* 35.5*  MCV 91.7 92.8  PLT 215 241   Lab Results  Component Value Date   TSH 2.221 05/05/2017   No results found for: HGBA1C No results found for: CHOL, HDL, LDLCALC, LDLDIRECT, TRIG, CHOLHDL  Significant Diagnostic Results in last 30 days:  No results found.  Assessment/Plan Tony [name] was seen today for medical management of chronic issues.  Diagnoses and all orders for this visit:  Vitamin D deficiency  Vitamin B12 deficiency  Open-angle glaucoma, unspecified glaucoma stage, unspecified laterality, unspecified open-angle glaucoma type   Above listed conditions stable  Continue current medication regimen  Continue to encourage pt interaction with other residents and participation in activities  Safety precautions  Fall precautions  Ambulate daily   Family/ staff Communication:   Total Time:  Documentation:  Face to Face:  Family/Phone:   Labs/tests ordered:  Not due, recent labs reviewed  Medication list reviewed and assessed for continued appropriateness. Monthly medication orders reviewed and signed.  Vikki Ports, NP-C Geriatrics [facility] [phone], [place] [address] Cell Phone (Mon-Fri 8am-5pm):  [phone] On Call:  [phone] & follow prompts after 5pm & weekends Office Phone:  [phone] Office Fax:  [phone]

## 2017-05-26 NOTE — Assessment & Plan Note (Signed)
Stable. Pt is followed routinely by Ophthamologist. On Cospot 22.3-6.8 mg- 1 drop both eyes BID and Alphagan 0.2% 1 drop BID. No c/o eye pain

## 2017-05-26 NOTE — Assessment & Plan Note (Signed)
Now Stable. Currently on Cholecalciferol 4,000 units po Q Day. On recent labs, level was 23.1 on 2,000 units. Dose increased. Will recheck D level on next routine lab draw.

## 2017-05-26 NOTE — Assessment & Plan Note (Signed)
Stable. Recent level 991 on 1,000 mcg po Q Day. Continue current dose.

## 2017-06-08 ENCOUNTER — Encounter
Admission: RE | Admit: 2017-06-08 | Discharge: 2017-06-08 | Disposition: A | Discharge status: Home / Self Care | Payer: Medicare Other | Source: Ambulatory Visit | Attending: Internal Medicine | Admitting: Internal Medicine

## 2017-06-21 DIAGNOSIS — R05 Cough: Secondary | ICD-10-CM | POA: Diagnosis not present

## 2017-06-27 ENCOUNTER — Non-Acute Institutional Stay: Payer: Medicare Other | Admitting: Gerontology

## 2017-06-27 ENCOUNTER — Encounter: Payer: Self-pay | Admitting: Gerontology

## 2017-06-27 DIAGNOSIS — F329 Major depressive disorder, single episode, unspecified: Secondary | ICD-10-CM

## 2017-06-27 DIAGNOSIS — F39 Unspecified mood [affective] disorder: Secondary | ICD-10-CM | POA: Diagnosis not present

## 2017-06-27 DIAGNOSIS — M545 Low back pain: Secondary | ICD-10-CM | POA: Diagnosis not present

## 2017-06-30 NOTE — Assessment & Plan Note (Signed)
Stable. No recent c/o exacerbation or worsening pain. Symptoms managed with Tylenol Arthritis 1,300 mg BID, and Tramadol 50 mg 1-2 tablets po Q 4 hours prn, Aspercreme 4% cream to area of pain TID PRN.

## 2017-06-30 NOTE — Assessment & Plan Note (Signed)
Stable. No recent worsening of depressive mood. Symptoms controlled with Celexa 20 mg po Q Day.

## 2017-06-30 NOTE — Progress Notes (Signed)
Location:    Nursing Home Room Number: 854 Place of Service:  ALF [phone]) Provider:  Toni Arthurs, NP-C  Kirk Ruths, MD  Patient Care Team: Kirk Ruths, MD as PCP - General (Internal Medicine)  Extended Emergency Contact Information Primary Emergency Contact: [employer] [facility] Address: [address] [place] [address] [name] of [place] Phone: [phone] Work Phone: [phone] Mobile Phone: [phone] Relation: Son Secondary Emergency Contact: Nelia Shi Address: [address]          [place], [place] [address] [name] of [place] Phone: [phone] Work Phone: [phone] Mobile Phone: [phone] Relation: Daughter  Code Status:  DNR Goals of care: Advanced Directive information Advanced Directives 06/27/2017  Does Patient Have a Medical Advance Directive? Yes  Type of Advance Directive Out of facility DNR (pink MOST or yellow form)  Does patient want to make changes to medical advance directive? No - Patient declined  Copy of [place] in Chart? -  Pre-existing out of facility DNR order (yellow form or pink MOST form) Yellow form placed in chart (order not valid for inpatient use)     Chief Complaint  Patient presents with  . Medical Management of Chronic Issues    Routine Visit    HPI:  Pt is a 84 y.o. male seen today for medical management of chronic diseases.    Mood disorder (HCC) Stable. No recent worsening of mood. Pt is typically withdrawn, but pleasant. Symptoms managed with Celexa 20 mg po Q Day and Namenda 5 mg po BID  Major depression, single episode Stable. No recent worsening of depressive mood. Symptoms controlled with Celexa 20 mg po Q Day.   Low back pain Stable. No recent c/o exacerbation or worsening pain. Symptoms managed with Tylenol Arthritis 1,300 mg BID, and Tramadol 50 mg 1-2 tablets po Q 4 hours prn, Aspercreme 4% cream to area of pain TID PRN.  Please note  pt with limited verbal/cognitive ability. Unable to obtain complete ROS. Some ROS info obtained from staff and documentation.   Past Medical History:  Diagnosis Date  . Anemia   . Benign prostatic hyperplasia   . BPH without obstruction/lower urinary tract symptoms 03/27/2012  . Cervical vertebral fracture (HCC)    C7 with small subdural hematoma  . Dementia   . Depression 02/05/2015   unspecified  . Dysphagia   . Facial fracture ([place])   . Falling   . Glaucoma   . Hypertension   . Intracranial injury ([place])   . Macular degeneration    early/dry  . Malignant neoplasm of prostate ([place]) 05/22/2012  . Osteoarthritis   . POAG (primary open-angle glaucoma)   . Sleep apnea 02/04/2015  . Spondylosis of cervical joint   . Subdural hematoma ([place])   . Traumatic brain injury ([place])   . Urge incontinence 03/27/2012   Past Surgical History:  Procedure Laterality Date  . APPENDECTOMY    . AQUEOUS SHUNT Right 03/17/2015   Procedure:  . CHOLECYSTECTOMY     x 2  . GALLBLADDER SURGERY    . GLAUCOMA SURGERY Right 12/2006  . HERNIA REPAIR    . LENS SURGERY Right 12/29/2010   With i-stent  . LENS SURGERY Left 02/28/2011   With i-stent  . TONSILLECTOMY    . TURP VAPORIZATION      No Known Allergies  Allergies as of 06/27/2017   No Known Allergies     Medication List  Accurate as of 06/27/17 11:59 PM. Always use your most recent med list.          amLODipine 5 MG tablet Commonly known as:  NORVASC Take 5 mg by mouth daily.   ASPERCREME W/LIDOCAINE 4 % cream Generic drug:  lidocaine Apply 1 application topically 3 (three) times daily as needed. Apply thin film to lumbo-sacral area for pain   aspirin EC 81 MG tablet Take 81 mg by mouth daily.   ASTEPRO 0.15 % Soln Generic drug:  Azelastine HCl Place 2 sprays into both nostrils at bedtime.   brimonidine 0.2 % ophthalmic solution Commonly known as:  ALPHAGAN Place 1 drop into both eyes 2 (two) times daily. Per Dr.  Laveda Norman, MD   carvedilol 6.25 MG tablet Commonly known as:  COREG Take 6.25 mg by mouth every 12 (twelve) hours.   citalopram 20 MG tablet Commonly known as:  CELEXA Take 20 mg by mouth daily.   coal tar 0.5 % shampoo Commonly known as:  NEUTROGENA T-GEL Apply 1 application topically once a week. On thursdays   dorzolamide-timolol 22.3-6.8 MG/ML ophthalmic solution Commonly known as:  COSOPT Place 1 drop into both eyes 2 (two) times daily. Per Dr. Arlis Porta. Wait 5 mins between admin of multiple eye drops   ENDIT EX Apply liberal amount topically to areas of skin irritation as needed. Ok to leave at bedside.   folic acid 419 MCG tablet Commonly known as:  FOLVITE Take 400 mcg by mouth daily.   loperamide 2 MG capsule Commonly known as:  IMODIUM Give 4 mg by mouth prn for loose stools and 2 mg after each subsequent loose stool up to 8 doses in 24 hours   magnesium hydroxide 400 MG/5ML suspension Commonly known as:  MILK OF MAGNESIA Take 30 mLs by mouth daily as needed for mild constipation. For constipation no results in 24 hours may administer bisacodyl supp   MAPAP ARTHRITIS PAIN 650 MG CR tablet Generic drug:  acetaminophen Take 1,300 mg by mouth 2 (two) times daily.   memantine 5 MG tablet Commonly known as:  NAMENDA Take 5 mg by mouth 2 (two) times daily.   mometasone 50 MCG/ACT nasal spray Commonly known as:  NASONEX Place 2 sprays into the nose every morning.   potassium chloride 10 MEQ CR capsule Commonly known as:  MICRO-K Take 10 mEq by mouth 2 (two) times daily.   sennosides-docusate sodium 8.6-50 MG tablet Commonly known as:  SENOKOT-S Take 1 tablet by mouth at bedtime.   tamsulosin 0.4 MG Caps capsule Commonly known as:  FLOMAX Take 0.4 mg by mouth daily.   traMADol 50 MG tablet Commonly known as:  ULTRAM Take 50-100 mg by mouth every 4 (four) hours as needed. pain; 1- moderate, 2- severe pain   traZODone 50 MG tablet Commonly known as:   DESYREL Take 25 mg by mouth at bedtime. 1/2 tab  for insomnia. OK to hold if pt is sleeping   vitamin B-12 1000 MCG tablet Commonly known as:  CYANOCOBALAMIN Take 1,000 mcg by mouth daily.   Vitamin D3 2000 units capsule Take 4,000 Units by mouth daily. 2 caps       Review of Systems  Unable to perform ROS: Dementia  Constitutional: Negative for activity change, appetite change, chills, diaphoresis and fever.  HENT: Negative for congestion, mouth sores, nosebleeds, postnasal drip, sneezing, sore throat, trouble swallowing and voice change.   Respiratory: Negative for apnea, cough, choking, chest tightness, shortness of breath and wheezing.  Cardiovascular: Negative for chest pain, palpitations and leg swelling.  Gastrointestinal: Negative for abdominal distention, abdominal pain, constipation, diarrhea and nausea.  Genitourinary: Negative for difficulty urinating, dysuria, frequency and urgency.  Musculoskeletal: Positive for arthralgias (typical arthritis). Negative for back pain, gait problem and myalgias.  Skin: Negative for color change, pallor, rash and wound.  Neurological: Positive for weakness. Negative for dizziness, tremors, syncope, speech difficulty, numbness and headaches.  Psychiatric/Behavioral: Negative for agitation and behavioral problems.  All other systems reviewed and are negative.   Immunization History  Administered Date(s) Administered  . Influenza-Unspecified 11/14/2015, 11/02/2016  . PPD Test 03/05/2016   Pertinent  Health Maintenance Due  Topic Date Due  . PNA vac Low Risk Adult (1 of 2 - PCV13) 02/23/1992  . INFLUENZA VACCINE  09/08/2017   No flowsheet data found. Functional Status Survey:    Vitals:   06/27/17 1156  BP: 134/62  Pulse: 68  Resp: 16  Temp: 97.7 F (36.5 C)  TempSrc: Oral  SpO2: 97%  Weight: 169 lb 12.8 oz (77 kg)  Height: 5\' 5"  (1.651 m)   Body mass index is 28.26 kg/m. Physical Exam  Constitutional: Vital signs are  normal. He appears well-developed and well-nourished. He is active and cooperative. He does not appear ill. No distress.  HENT:  Head: Normocephalic and atraumatic.  Mouth/Throat: Uvula is midline, oropharynx is clear and moist and mucous membranes are normal. Mucous membranes are not pale, not dry and not cyanotic.  Eyes: Pupils are equal, round, and reactive to light. Conjunctivae, EOM and lids are normal.  Neck: Trachea normal, normal range of motion and full passive range of motion without pain. Neck supple. No JVD present. No tracheal deviation, no edema and no erythema present. No thyromegaly present.  Cardiovascular: Normal rate, regular rhythm, normal heart sounds, intact distal pulses and normal pulses. Exam reveals no gallop, no distant heart sounds and no friction rub.  No murmur heard. Pulses:      Dorsalis pedis pulses are 2+ on the right side, and 2+ on the left side.  No edema  Pulmonary/Chest: Effort normal and breath sounds normal. No accessory muscle usage. No respiratory distress. He has no decreased breath sounds. He has no wheezes. He has no rhonchi. He has no rales. He exhibits no tenderness.  Abdominal: Soft. Normal appearance and bowel sounds are normal. He exhibits no distension and no ascites. There is no tenderness.  Musculoskeletal: Normal range of motion. He exhibits no edema or tenderness.  Expected osteoarthritis, stiffness; Bilateral Calves soft, supple. Negative Homan's Sign. B- pedal pulses equal; generalized weakness, mobile with rollator  Neurological: He is alert. He has normal strength. He exhibits abnormal muscle tone. Coordination and gait abnormal.  Skin: Skin is warm, dry and intact. He is not diaphoretic. No cyanosis. No pallor. Nails show no clubbing.  Psychiatric: His speech is normal. Judgment and thought content normal. His affect is blunt. He is slowed and withdrawn. Cognition and memory are impaired. He exhibits abnormal recent memory.  Nursing note  and vitals reviewed.   Labs reviewed: Recent Labs    10/28/16 0510 05/05/17 0838  NA 139 140  K 3.6 3.6  CL 109 108  CO2 24 23  GLUCOSE 105* 96  BUN 21* 21*  CREATININE 0.90 0.99  CALCIUM 8.5* 8.5*  MG 2.2 2.3   Recent Labs    10/28/16 0510 05/05/17 0838  AST 15 18  ALT 14* 17  ALKPHOS 47 51  BILITOT 0.6 0.6  PROT  6.3* 6.2*  ALBUMIN 3.3* 3.4*   Recent Labs    10/28/16 0510 05/05/17 0838  WBC 6.5 6.4  NEUTROABS 4.0 3.8  HGB 12.0* 11.9*  HCT 35.0* 35.5*  MCV 91.7 92.8  PLT 215 241   Lab Results  Component Value Date   TSH 2.221 05/05/2017   No results found for: HGBA1C No results found for: CHOL, HDL, LDLCALC, LDLDIRECT, TRIG, CHOLHDL  Significant Diagnostic Results in last 30 days:  No results found.  Assessment/Plan Lacharles was seen today for medical management of chronic issues.  Diagnoses and all orders for this visit:  Mood disorder ([place])  Major depressive disorder with single episode, remission status unspecified  Low back pain, unspecified back pain laterality, unspecified chronicity, with sciatica presence unspecified   Above listed conditions stable  Continue current medication regimen  Continue to encourage pt participation in activities and interaction with other residents  Safety precautions  Fall precautions  Monitor for worsening pain  Monitor for worsening mood/depression/increased withdrawal   Family/ staff Communication:   Total Time:  Documentation:  Face to Face:  Family/Phone:   Labs/tests ordered:  Not due  Medication list reviewed and assessed for continued appropriateness. Monthly medication orders reviewed and signed.  Vikki Ports, NP-C Geriatrics [facility] [phone], [place] [address] Cell Phone (Mon-Fri 8am-5pm):  [phone] On Call:  [phone] & follow prompts after 5pm & weekends Office Phone:  [phone] Office Fax:  [phone]

## 2017-06-30 NOTE — Assessment & Plan Note (Signed)
Stable. No recent worsening of mood. Pt is typically withdrawn, but pleasant. Symptoms managed with Celexa 20 mg po Q Day and Namenda 5 mg po BID

## 2017-07-09 ENCOUNTER — Encounter
Admission: RE | Admit: 2017-07-09 | Discharge: 2017-07-09 | Disposition: A | Discharge status: Home / Self Care | Payer: Medicare Other | Source: Ambulatory Visit | Attending: Internal Medicine | Admitting: Internal Medicine

## 2017-07-27 ENCOUNTER — Non-Acute Institutional Stay: Payer: Medicare Other | Admitting: Gerontology

## 2017-07-27 ENCOUNTER — Encounter: Payer: Self-pay | Admitting: Gerontology

## 2017-07-27 DIAGNOSIS — G4733 Obstructive sleep apnea (adult) (pediatric): Secondary | ICD-10-CM

## 2017-07-27 DIAGNOSIS — I10 Essential (primary) hypertension: Secondary | ICD-10-CM

## 2017-07-27 DIAGNOSIS — J309 Allergic rhinitis, unspecified: Secondary | ICD-10-CM

## 2017-08-08 ENCOUNTER — Encounter
Admission: RE | Admit: 2017-08-08 | Discharge: 2017-08-08 | Disposition: A | Discharge status: Home / Self Care | Payer: Medicare Other | Source: Ambulatory Visit | Attending: Internal Medicine | Admitting: Internal Medicine

## 2017-08-08 NOTE — Progress Notes (Signed)
Location:    Nursing Home Room Number: 277 Place of Service:  ALF [phone]) Provider:  Toni Arthurs, NP-C  Kirk Ruths, MD  Patient Care Team: Kirk Ruths, MD as PCP - General (Internal Medicine)  Extended Emergency Contact Information Primary Emergency Contact: [facility] Address: [address] [place] [address] [name] of [place] Phone: [phone] Work Phone: [phone] Mobile Phone: [phone] Relation: Son Secondary Emergency Contact: Nelia Shi Address: [address]          [place], [place] [address] [name] of [place] Phone: [phone] Work Phone: [phone] Mobile Phone: [phone] Relation: Daughter  Code Status:  DNR Goals of care: Advanced Directive information Advanced Directives 07/27/2017  Does Patient Have a Medical Advance Directive? Yes  Type of Advance Directive Out of facility DNR (pink MOST or yellow form)  Does patient want to make changes to medical advance directive? No - Patient declined  Copy of [place] in Chart? -  Pre-existing out of facility DNR order (yellow form or pink MOST form) Yellow form placed in chart (order not valid for inpatient use)     Chief Complaint  Patient presents with  . Medical Management of Chronic Issues    Routine Visit    HPI:  Pt is a 84 y.o. male seen today for medical management of chronic diseases.    Essential (primary) hypertension Stable. No recent episodes of hyper or hypotension. Stable on Norvasc 5 mg po Q Day and Coreg 6.25 mg po BID  Allergic rhinitis Stable. No recent exacerbations. On Astepro 0.15%- 2 sprays at HS and Nasonex 50 mcg- 1 spray each nostril Q AM  Obstructive sleep apnea (adult) (pediatric) Stable. Does not use C-PAP. No shortness of breath  Please note pt with limited verbal/cognitive ability. Unable to obtain complete ROS. Some ROS info obtained from staff and documentation.   Past Medical  History:  Diagnosis Date  . Anemia   . Benign prostatic hyperplasia   . BPH without obstruction/lower urinary tract symptoms 03/27/2012  . Cervical vertebral fracture (HCC)    C7 with small subdural hematoma  . Dementia   . Depression 02/05/2015   unspecified  . Dysphagia   . Facial fracture ([place])   . Falling   . Glaucoma   . Hypertension   . Intracranial injury ([place])   . Macular degeneration    early/dry  . Malignant neoplasm of prostate ([place]) 05/22/2012  . Osteoarthritis   . POAG (primary open-angle glaucoma)   . Sleep apnea 02/04/2015  . Spondylosis of cervical joint   . Subdural hematoma ([place])   . Traumatic brain injury ([place])   . Urge incontinence 03/27/2012   Past Surgical History:  Procedure Laterality Date  . APPENDECTOMY    . AQUEOUS SHUNT Right 03/17/2015   Procedure:  . CHOLECYSTECTOMY     x 2  . GALLBLADDER SURGERY    . GLAUCOMA SURGERY Right 12/2006  . HERNIA REPAIR    . LENS SURGERY Right 12/29/2010   With i-stent  . LENS SURGERY Left 02/28/2011   With i-stent  . TONSILLECTOMY    . TURP VAPORIZATION      No Known Allergies  Allergies as of 07/27/2017   No Known Allergies     Medication List        Accurate as of 07/27/17 11:59 PM. Always use your most recent med list.  amLODipine 5 MG tablet Commonly known as:  NORVASC Take 5 mg by mouth daily.   ASPERCREME W/LIDOCAINE 4 % cream Generic drug:  lidocaine Apply 1 application topically 3 (three) times daily as needed. Apply thin film to lumbo-sacral area for pain   aspirin EC 81 MG tablet Take 81 mg by mouth daily.   ASTEPRO 0.15 % Soln Generic drug:  Azelastine HCl Place 2 sprays into both nostrils at bedtime.   brimonidine 0.2 % ophthalmic solution Commonly known as:  ALPHAGAN Place 1 drop into both eyes 2 (two) times daily. Per Dr. Laveda Norman, MD   carvedilol 6.25 MG tablet Commonly known as:  COREG Take 6.25 mg by mouth every 12 (twelve) hours.   citalopram 20 MG  tablet Commonly known as:  CELEXA Take 20 mg by mouth daily.   coal tar 0.5 % shampoo Commonly known as:  NEUTROGENA T-GEL Apply 1 application topically once a week. On thursdays   dorzolamide-timolol 22.3-6.8 MG/ML ophthalmic solution Commonly known as:  COSOPT Place 1 drop into both eyes 2 (two) times daily. Per Dr. Arlis Porta. Wait 5 mins between admin of multiple eye drops   ENDIT EX Apply liberal amount topically to areas of skin irritation as needed. Ok to leave at bedside.   folic acid 673 MCG tablet Commonly known as:  FOLVITE Take 400 mcg by mouth daily.   loperamide 2 MG capsule Commonly known as:  IMODIUM Give 4 mg by mouth prn for loose stools and 2 mg after each subsequent loose stool up to 8 doses in 24 hours   magnesium hydroxide 400 MG/5ML suspension Commonly known as:  MILK OF MAGNESIA Take 30 mLs by mouth daily as needed for mild constipation. For constipation no results in 24 hours may administer bisacodyl supp   MAPAP ARTHRITIS PAIN 650 MG CR tablet Generic drug:  acetaminophen Take 1,300 mg by mouth 2 (two) times daily.   memantine 5 MG tablet Commonly known as:  NAMENDA Take 5 mg by mouth 2 (two) times daily.   mometasone 50 MCG/ACT nasal spray Commonly known as:  NASONEX Place 2 sprays into the nose every morning.   potassium chloride 10 MEQ CR capsule Commonly known as:  MICRO-K Take 10 mEq by mouth 2 (two) times daily.   sennosides-docusate sodium 8.6-50 MG tablet Commonly known as:  SENOKOT-S Take 1 tablet by mouth at bedtime.   tamsulosin 0.4 MG Caps capsule Commonly known as:  FLOMAX Take 0.4 mg by mouth daily.   traMADol 50 MG tablet Commonly known as:  ULTRAM Take 50-100 mg by mouth every 4 (four) hours as needed. pain; 1- moderate, 2- severe pain   traZODone 50 MG tablet Commonly known as:  DESYREL Take 25 mg by mouth at bedtime. 1/2 tab  for insomnia. OK to hold if pt is sleeping   vitamin B-12 1000 MCG tablet Commonly known as:   CYANOCOBALAMIN Take 1,000 mcg by mouth daily.   Vitamin D3 2000 units capsule Take 4,000 Units by mouth daily. 2 caps       Review of Systems  Unable to perform ROS: Dementia  Constitutional: Negative for activity change, appetite change, chills, diaphoresis and fever.  HENT: Negative for congestion, mouth sores, nosebleeds, postnasal drip, sneezing, sore throat, trouble swallowing and voice change.   Respiratory: Negative for apnea, cough, choking, chest tightness, shortness of breath and wheezing.   Cardiovascular: Negative for chest pain, palpitations and leg swelling.  Gastrointestinal: Negative for abdominal distention, abdominal pain, constipation, diarrhea and  nausea.  Genitourinary: Negative for difficulty urinating, dysuria, frequency and urgency.  Musculoskeletal: Positive for back pain and gait problem. Negative for myalgias. Arthralgias: typical arthritis.  Skin: Negative for color change, pallor, rash and wound.  Neurological: Positive for weakness. Negative for dizziness, tremors, syncope, speech difficulty, numbness and headaches.  Psychiatric/Behavioral: Negative for agitation and behavioral problems.  All other systems reviewed and are negative.   Immunization History  Administered Date(s) Administered  . Influenza-Unspecified 11/14/2015, 11/02/2016  . PPD Test 03/05/2016   Pertinent  Health Maintenance Due  Topic Date Due  . PNA vac Low Risk Adult (1 of 2 - PCV13) 02/23/1992  . INFLUENZA VACCINE  09/08/2017   No flowsheet data found. Functional Status Survey:    Vitals:   07/27/17 1202  BP: 132/66  Pulse: 66  Resp: 18  Temp: (!) 96.6 F (35.9 C)  TempSrc: Oral  SpO2: 96%  Weight: 168 lb (76.2 kg)  Height: 5\' 5"  (1.651 m)   Body mass index is 27.96 kg/m. Physical Exam  Constitutional: Vital signs are normal. He appears well-developed and well-nourished. He is active and cooperative. He does not appear ill. No distress.  HENT:  Head:  Normocephalic and atraumatic.  Mouth/Throat: Uvula is midline, oropharynx is clear and moist and mucous membranes are normal. Mucous membranes are not pale, not dry and not cyanotic.  Eyes: Pupils are equal, round, and reactive to light. Conjunctivae, EOM and lids are normal.  Neck: Trachea normal, normal range of motion and full passive range of motion without pain. Neck supple. No JVD present. No tracheal deviation, no edema and no erythema present. No thyromegaly present.  Cardiovascular: Normal rate, regular rhythm, normal heart sounds, intact distal pulses and normal pulses. Exam reveals no gallop, no distant heart sounds and no friction rub.  No murmur heard. Pulses:      Dorsalis pedis pulses are 2+ on the right side, and 2+ on the left side.  No edema  Pulmonary/Chest: Effort normal and breath sounds normal. No accessory muscle usage. No respiratory distress. He has no decreased breath sounds. He has no wheezes. He has no rhonchi. He has no rales. He exhibits no tenderness.  Abdominal: Soft. Normal appearance and bowel sounds are normal. He exhibits no distension and no ascites. There is no tenderness.  Musculoskeletal: Normal range of motion. He exhibits no edema or tenderness.  Expected osteoarthritis, stiffness; Bilateral Calves soft, supple. Negative Homan's Sign. B- pedal pulses equal; generalized weakness, ambulates with walker  Neurological: He is alert. He exhibits abnormal muscle tone. Coordination abnormal.  Skin: Skin is warm, dry and intact. He is not diaphoretic. No cyanosis. No pallor. Nails show no clubbing.  Psychiatric: His speech is normal. Judgment and thought content normal. His affect is blunt. He is slowed and withdrawn. Cognition and memory are impaired. He exhibits abnormal recent memory.  Nursing note and vitals reviewed.   Labs reviewed: Recent Labs    10/28/16 0510 05/05/17 0838  NA 139 140  K 3.6 3.6  CL 109 108  CO2 24 23  GLUCOSE 105* 96  BUN 21* 21*   CREATININE 0.90 0.99  CALCIUM 8.5* 8.5*  MG 2.2 2.3   Recent Labs    10/28/16 0510 05/05/17 0838  AST 15 18  ALT 14* 17  ALKPHOS 47 51  BILITOT 0.6 0.6  PROT 6.3* 6.2*  ALBUMIN 3.3* 3.4*   Recent Labs    10/28/16 0510 05/05/17 0838  WBC 6.5 6.4  NEUTROABS 4.0 3.8  HGB  12.0* 11.9*  HCT 35.0* 35.5*  MCV 91.7 92.8  PLT 215 241   Lab Results  Component Value Date   TSH 2.221 05/05/2017   No results found for: HGBA1C No results found for: CHOL, HDL, LDLCALC, LDLDIRECT, TRIG, CHOLHDL  Significant Diagnostic Results in last 30 days:  No results found.  Assessment/Plan Jonmichael was seen today for medical management of chronic issues.  Diagnoses and all orders for this visit:  Essential (primary) hypertension  Allergic rhinitis, unspecified seasonality, unspecified trigger  Obstructive sleep apnea (adult) (pediatric)   Above listed conditions stable  Continue current medication regimen  Continue to encourage pt to participate in activities  Assist with ADLs as appropriate  Monitor for elevated BPs  Monitor for shortness of breath  Safety precautions  Fall precautions  Family/ staff Communication:   Total Time:  Documentation:  Face to Face:  Family/Phone:   Labs/tests ordered:  Not due. Recent labs reviewed- stable  Medication list reviewed and assessed for continued appropriateness. Monthly medication orders reviewed and signed.  Vikki Ports, NP-C Geriatrics [facility] [phone], [place] [address] Cell Phone (Mon-Fri 8am-5pm):  [phone] On Call:  [phone] & follow prompts after 5pm & weekends Office Phone:  [phone] Office Fax:  [phone]

## 2017-08-08 NOTE — Assessment & Plan Note (Signed)
Stable. Does not use C-PAP. No shortness of breath

## 2017-08-08 NOTE — Assessment & Plan Note (Signed)
Stable. No recent episodes of hyper or hypotension. Stable on Norvasc 5 mg po Q Day and Coreg 6.25 mg po BID

## 2017-08-08 NOTE — Assessment & Plan Note (Signed)
Stable. No recent exacerbations. On Astepro 0.15%- 2 sprays at HS and Nasonex 50 mcg- 1 spray each nostril Q AM

## 2017-08-26 ENCOUNTER — Other Ambulatory Visit: Payer: Self-pay | Admitting: *Deleted

## 2017-08-26 MED ORDER — CITALOPRAM HYDROBROMIDE 20 MG PO TABS: 20.0000 mg | ORAL_TABLET | Freq: Every day | ORAL | 12 refills | Status: AC

## 2017-08-26 NOTE — Telephone Encounter (Signed)
[place] Ph: [phone]  Fax: [phone]

## 2017-08-30 DIAGNOSIS — H401133 Primary open-angle glaucoma, bilateral, severe stage: Secondary | ICD-10-CM | POA: Diagnosis not present

## 2017-09-08 ENCOUNTER — Encounter
Admission: RE | Admit: 2017-09-08 | Discharge: 2017-09-08 | Disposition: A | Discharge status: Home / Self Care | Payer: Medicare Other | Source: Ambulatory Visit | Attending: Internal Medicine | Admitting: Internal Medicine

## 2017-09-15 DIAGNOSIS — F325 Major depressive disorder, single episode, in full remission: Secondary | ICD-10-CM | POA: Diagnosis not present

## 2017-09-15 DIAGNOSIS — I10 Essential (primary) hypertension: Secondary | ICD-10-CM | POA: Diagnosis not present

## 2017-09-15 DIAGNOSIS — G301 Alzheimer's disease with late onset: Secondary | ICD-10-CM | POA: Diagnosis not present

## 2017-09-15 DIAGNOSIS — F0280 Dementia in other diseases classified elsewhere without behavioral disturbance: Secondary | ICD-10-CM | POA: Diagnosis not present

## 2017-09-15 DIAGNOSIS — Z593 Problems related to living in residential institution: Secondary | ICD-10-CM | POA: Diagnosis not present

## 2017-09-15 DIAGNOSIS — I5032 Chronic diastolic (congestive) heart failure: Secondary | ICD-10-CM | POA: Diagnosis not present

## 2017-09-19 ENCOUNTER — Other Ambulatory Visit
Admission: RE | Admit: 2017-09-19 | Discharge: 2017-09-19 | Disposition: A | Discharge status: Home / Self Care | Payer: Medicare Other | Source: Ambulatory Visit | Attending: Internal Medicine | Admitting: Internal Medicine

## 2017-09-19 DIAGNOSIS — R410 Disorientation, unspecified: Secondary | ICD-10-CM | POA: Insufficient documentation

## 2017-09-19 DIAGNOSIS — M47812 Spondylosis without myelopathy or radiculopathy, cervical region: Secondary | ICD-10-CM | POA: Diagnosis not present

## 2017-09-19 DIAGNOSIS — R1312 Dysphagia, oropharyngeal phase: Secondary | ICD-10-CM | POA: Diagnosis not present

## 2017-09-19 DIAGNOSIS — G301 Alzheimer's disease with late onset: Secondary | ICD-10-CM | POA: Diagnosis not present

## 2017-09-19 LAB — URINALYSIS, ROUTINE W REFLEX MICROSCOPIC
Bilirubin Urine: NEGATIVE
GLUCOSE, UA: NEGATIVE mg/dL
Ketones, ur: NEGATIVE mg/dL
NITRITE: POSITIVE — AB
Protein, ur: NEGATIVE mg/dL
SPECIFIC GRAVITY, URINE: 1.015 (ref 1.005–1.030)
Squamous Epithelial / LPF: NONE SEEN (ref 0–5)
pH: 6 (ref 5.0–8.0)

## 2017-09-21 ENCOUNTER — Non-Acute Institutional Stay: Payer: Medicare Other | Admitting: Adult Health

## 2017-09-21 ENCOUNTER — Encounter: Payer: Self-pay | Admitting: Adult Health

## 2017-09-21 DIAGNOSIS — M545 Low back pain: Secondary | ICD-10-CM | POA: Diagnosis not present

## 2017-09-21 DIAGNOSIS — N390 Urinary tract infection, site not specified: Secondary | ICD-10-CM | POA: Diagnosis not present

## 2017-09-21 DIAGNOSIS — N401 Enlarged prostate with lower urinary tract symptoms: Secondary | ICD-10-CM

## 2017-09-21 DIAGNOSIS — F324 Major depressive disorder, single episode, in partial remission: Secondary | ICD-10-CM | POA: Diagnosis not present

## 2017-09-21 DIAGNOSIS — J309 Allergic rhinitis, unspecified: Secondary | ICD-10-CM

## 2017-09-21 DIAGNOSIS — F0280 Dementia in other diseases classified elsewhere without behavioral disturbance: Secondary | ICD-10-CM

## 2017-09-21 DIAGNOSIS — K5909 Other constipation: Secondary | ICD-10-CM

## 2017-09-21 DIAGNOSIS — E876 Hypokalemia: Secondary | ICD-10-CM | POA: Diagnosis not present

## 2017-09-21 DIAGNOSIS — G8929 Other chronic pain: Secondary | ICD-10-CM

## 2017-09-21 DIAGNOSIS — G4733 Obstructive sleep apnea (adult) (pediatric): Secondary | ICD-10-CM | POA: Diagnosis not present

## 2017-09-21 DIAGNOSIS — N138 Other obstructive and reflux uropathy: Secondary | ICD-10-CM

## 2017-09-21 DIAGNOSIS — M47812 Spondylosis without myelopathy or radiculopathy, cervical region: Secondary | ICD-10-CM

## 2017-09-21 DIAGNOSIS — G301 Alzheimer's disease with late onset: Secondary | ICD-10-CM | POA: Diagnosis not present

## 2017-09-21 DIAGNOSIS — B9620 Unspecified Escherichia coli [E. coli] as the cause of diseases classified elsewhere: Secondary | ICD-10-CM

## 2017-09-21 DIAGNOSIS — H4010X0 Unspecified open-angle glaucoma, stage unspecified: Secondary | ICD-10-CM

## 2017-09-21 NOTE — Progress Notes (Signed)
Location:   [facility] Room Number: 245 Place of Service:  ALF (13)   CODE STATUS: DNR  No Known Allergies  Chief Complaint  Patient presents with  . Medical Management of Chronic Issues  Spondylosis; allergic rhinitis; alzheimer; OSA.     HPI:  He is a long term assisted living resident of this facility being seen for the management of his chronic illnesses: spondylosis allergic rhinitis; alzheimer osa. Staff reports that he is having increased confusion; increased urinary frequency. There are no reports of fever; no uncontrolled pain.   Past Medical History:  Diagnosis Date  . Anemia   . Benign prostatic hyperplasia   . BPH without obstruction/lower urinary tract symptoms 03/27/2012  . Cervical vertebral fracture (HCC)    C7 with small subdural hematoma  . Dementia   . Depression 02/05/2015   unspecified  . Dysphagia   . Facial fracture ([place])   . Falling   . Glaucoma   . Hypertension   . Intracranial injury ([place])   . Macular degeneration    early/dry  . Malignant neoplasm of prostate ([place]) 05/22/2012  . Osteoarthritis   . POAG (primary open-angle glaucoma)   . Sleep apnea 02/04/2015  . Spondylosis of cervical joint   . Subdural hematoma ([place])   . Traumatic brain injury ([place])   . Urge incontinence 03/27/2012    Past Surgical History:  Procedure Laterality Date  . APPENDECTOMY    . AQUEOUS SHUNT Right 03/17/2015   Procedure:  . CHOLECYSTECTOMY     x 2  . GALLBLADDER SURGERY    . GLAUCOMA SURGERY Right 12/2006  . HERNIA REPAIR    . LENS SURGERY Right 12/29/2010   With i-stent  . LENS SURGERY Left 02/28/2011   With i-stent  . TONSILLECTOMY    . TURP VAPORIZATION      Social History   Socioeconomic History  . Marital status: Married    Spouse name: Not on file  . Number of children: 3  . Years of education: 12+  . Highest education level: Master's degree (e.g., MA, MS, MEng, MEd, MSW, MBA)  Occupational History   Comment: retired Community education officer  Social Needs  . Financial resource strain: Not on file  . Food insecurity:    Worry: Not on file    Inability: Not on file  . Transportation needs:    Medical: Not on file    Non-medical: Not on file  Tobacco Use  . Smoking status: Never Smoker  . Smokeless tobacco: Never Used  Substance and Sexual Activity  . Alcohol use: Yes    Alcohol/week: 1.0 standard drinks    Types: 1 Cans of beer per week    Comment: very little  . Drug use: No  . Sexual activity: Not on file  Lifestyle  . Physical activity:    Days per week: Not on file    Minutes per session: Not on file  . Stress: Not on file  Relationships  . Social connections:    Talks on phone: Not on file    Gets together: Not on file    Attends religious service: Not on file    Active member of club or organization: Not on file    Attends meetings of clubs or organizations: Not on file    Relationship status: Not on file  . Intimate partner violence:    Fear of current or ex partner: Not on file    Emotionally abused: Not on file  Physically abused: Not on file    Forced sexual activity: Not on file  Other Topics Concern  . Not on file  Social History Narrative   Admitted to [facility] 10/18/2011   Married   Drinks 1 beer weekly    Never Smoker   DNR   Family History  Problem Relation Age of Onset  . Hypertension Father   . Anesthesia problems Neg Hx   . Cataracts Neg Hx   . Glaucoma Neg Hx   . Macular degeneration Neg Hx   . GU problems Neg Hx   . Kidney disease Neg Hx   . Prostate cancer Neg Hx       VITAL SIGNS BP (!) 159/68   Pulse 74   Temp (!) 97.3 F (36.3 C) (Oral)   Resp 20   Ht 5\' 5"  (1.651 m)   Wt 167 lb 3.2 oz (75.8 kg)   SpO2 98%   BMI 27.82 kg/m   Outpatient Encounter Medications as of 09/21/2017  Medication Sig  . acetaminophen (MAPAP ARTHRITIS PAIN) 650 MG CR tablet Take 1,300 mg by mouth 2 (two) times daily.  Marland Kitchen amLODipine (NORVASC) 5 MG  tablet Take 5 mg by mouth daily.   Marland Kitchen aspirin EC 81 MG tablet Take 81 mg by mouth daily.   . Azelastine HCl (ASTEPRO) 0.15 % SOLN Place 2 sprays into both nostrils at bedtime.   . brimonidine (ALPHAGAN) 0.2 % ophthalmic solution Place 1 drop into both eyes 2 (two) times daily. Per Dr. Laveda Norman, MD  . carvedilol (COREG) 6.25 MG tablet Take 6.25 mg by mouth every 12 (twelve) hours.   . Cholecalciferol (VITAMIN D3) 2000 units capsule Take 4,000 Units by mouth daily. 2 caps  . citalopram (CELEXA) 20 MG tablet Take 1 tablet (20 mg total) by mouth daily.  . coal tar (NEUTROGENA T-GEL) 0.5 % shampoo Apply 1 application topically once a week. On thursdays  . dorzolamide-timolol (COSOPT) 22.3-6.8 MG/ML ophthalmic solution Place 1 drop into both eyes 2 (two) times daily. Per Dr. Arlis Porta. Wait 5 mins between admin of multiple eye drops  . folic acid (FOLVITE) 259 MCG tablet Take 400 mcg by mouth daily.   Marland Kitchen lidocaine (ASPERCREME W/LIDOCAINE) 4 % cream Apply 1 application topically 3 (three) times daily as needed. Apply thin film to lumbo-sacral area for pain  . loperamide (IMODIUM) 2 MG capsule Give 4 mg by mouth prn for loose stools and 2 mg after each subsequent loose stool up to 8 doses in 24 hours  . magnesium hydroxide (MILK OF MAGNESIA) 400 MG/5ML suspension Take 30 mLs by mouth daily as needed for mild constipation. For constipation no results in 24 hours may administer bisacodyl supp  . memantine (NAMENDA) 5 MG tablet Take 5 mg by mouth 2 (two) times daily.  . mometasone (NASONEX) 50 MCG/ACT nasal spray Place 2 sprays into the nose every morning.   . potassium chloride (MICRO-K) 10 MEQ CR capsule Take 10 mEq by mouth 2 (two) times daily.   . sennosides-docusate sodium (SENOKOT-S) 8.6-50 MG tablet Take 1 tablet by mouth at bedtime.  . Skin Protectants, Misc. (ENDIT EX) Apply liberal amount topically to areas of skin irritation as needed. Ok to leave at bedside.  . tamsulosin (FLOMAX) 0.4 MG CAPS capsule  Take 0.4 mg by mouth daily.  . traMADol (ULTRAM) 50 MG tablet Take 50-100 mg by mouth every 4 (four) hours as needed. pain; 1- moderate, 2- severe pain  . traZODone (DESYREL) 50 MG  tablet Take 25 mg by mouth at bedtime. 1/2 tab  for insomnia. OK to hold if pt is sleeping  . UNABLE TO FIND Regular Diet Ground meat and soft vegetables  . vitamin B-12 (CYANOCOBALAMIN) 1000 MCG tablet Take 1,000 mcg by mouth daily.    No facility-administered encounter medications on file as of 09/21/2017.      SIGNIFICANT DIAGNOSTIC EXAMS   LABS REVIEWED: TODAY:   05-05-17: wbc 6.4; hgb 11.9; hct 35.5; mcv 92.8; plt 241; glucose 96; bun 21; creat 0.99; k+ 3.6; na++ 140; ca 8.5; liver normal albumin 3.4 mag 2.3 tsh 2.221; vit B 12: 991; vit D 23.1  09-19-17: urine culture e-coli (not final) treat with doxycycline   Review of Systems  Unable to perform ROS: Dementia (unable to participate )    Physical Exam  Constitutional: He appears well-developed and well-nourished. No distress.  Neck: No thyromegaly present.  Cardiovascular: Normal rate, regular rhythm, normal heart sounds and intact distal pulses.  Pulmonary/Chest: Effort normal and breath sounds normal. No respiratory distress.  Abdominal: Soft. Bowel sounds are normal. He exhibits no distension. There is no tenderness.  Musculoskeletal: Normal range of motion. He exhibits no edema.  Lymphadenopathy:    He has no cervical adenopathy.  Neurological: He is alert.  Skin: Skin is warm and dry. He is not diaphoretic.  Psychiatric: He has a normal mood and affect.      ASSESSMENT/ PLAN:   TODAY:   1. Essential (primary) hypertension: is stable 159/68: will continue norvasc 5 mg daily coreg 6.25 mg twice daily asa 81 mg daily   2. OSA is stable is not using cpap; will monitor  3.  Allergic rhinitis: is stable will continue astepro 2 pufs every night and nasonex daily   4. Late onset alzheimer's disease without behavioral disturbance: is  without changes: weight is 167; pounds; will continue to monitor his status.   5. Spondylosis without myelopathy or radiculopathy; cervical region: is stable will continue lidocaine 4% to back three times daily as needed has ultram 50 or 100 mg every 4 hours as needed   6.  Benign prostatic hyperplasia with urinary obstruction: is stable will continue flomax 0.4 mg daily   7.  Hypokalemia: stable k+ 3.8; will continue k+ 10 meq twice daily   8. Bilateral open-angle glaucoma: is stable will continue alphagan to both eyes twice daily cosopt to both eyes twice daily   9. Chronic back pain: is stable will continue tylenol 1300 mg twice daily   10. Major depression single episode partial remission: is worse  will continue celexa 20 mg daily and will increase trazodone to 50 mg nightly to help with sleep   11. Chronic constipation: is stable will continue senna s nightly   12. E-coli UTI: final culture has not returned will begin doxycycline 100 mg twice daily for 7 days with probiotic   He need pneumovax    MD is aware of resident's narcotic use and is in agreement with current plan of care. We will attempt to wean resident as apropriate   Ok Edwards NP [facility]  Contact [phone] Monday through Friday 8am- 5pm  After hours call [phone]

## 2017-09-22 LAB — URINE CULTURE

## 2017-09-26 DIAGNOSIS — M47812 Spondylosis without myelopathy or radiculopathy, cervical region: Secondary | ICD-10-CM | POA: Diagnosis not present

## 2017-09-26 DIAGNOSIS — R1312 Dysphagia, oropharyngeal phase: Secondary | ICD-10-CM | POA: Diagnosis not present

## 2017-09-26 DIAGNOSIS — G301 Alzheimer's disease with late onset: Secondary | ICD-10-CM | POA: Diagnosis not present

## 2017-09-27 DIAGNOSIS — M47812 Spondylosis without myelopathy or radiculopathy, cervical region: Secondary | ICD-10-CM | POA: Diagnosis not present

## 2017-09-27 DIAGNOSIS — G301 Alzheimer's disease with late onset: Secondary | ICD-10-CM | POA: Diagnosis not present

## 2017-09-27 DIAGNOSIS — R1312 Dysphagia, oropharyngeal phase: Secondary | ICD-10-CM | POA: Diagnosis not present

## 2017-09-28 DIAGNOSIS — M47812 Spondylosis without myelopathy or radiculopathy, cervical region: Secondary | ICD-10-CM | POA: Diagnosis not present

## 2017-09-28 DIAGNOSIS — G301 Alzheimer's disease with late onset: Secondary | ICD-10-CM | POA: Diagnosis not present

## 2017-09-28 DIAGNOSIS — R1312 Dysphagia, oropharyngeal phase: Secondary | ICD-10-CM | POA: Diagnosis not present

## 2017-09-29 DIAGNOSIS — M47812 Spondylosis without myelopathy or radiculopathy, cervical region: Secondary | ICD-10-CM | POA: Diagnosis not present

## 2017-09-29 DIAGNOSIS — R1312 Dysphagia, oropharyngeal phase: Secondary | ICD-10-CM | POA: Diagnosis not present

## 2017-09-29 DIAGNOSIS — G301 Alzheimer's disease with late onset: Secondary | ICD-10-CM | POA: Diagnosis not present

## 2017-09-30 DIAGNOSIS — M47812 Spondylosis without myelopathy or radiculopathy, cervical region: Secondary | ICD-10-CM | POA: Diagnosis not present

## 2017-09-30 DIAGNOSIS — R1312 Dysphagia, oropharyngeal phase: Secondary | ICD-10-CM | POA: Diagnosis not present

## 2017-09-30 DIAGNOSIS — G301 Alzheimer's disease with late onset: Secondary | ICD-10-CM | POA: Diagnosis not present

## 2017-10-03 DIAGNOSIS — G301 Alzheimer's disease with late onset: Secondary | ICD-10-CM | POA: Diagnosis not present

## 2017-10-03 DIAGNOSIS — R1312 Dysphagia, oropharyngeal phase: Secondary | ICD-10-CM | POA: Diagnosis not present

## 2017-10-03 DIAGNOSIS — N390 Urinary tract infection, site not specified: Secondary | ICD-10-CM

## 2017-10-03 DIAGNOSIS — M47812 Spondylosis without myelopathy or radiculopathy, cervical region: Secondary | ICD-10-CM | POA: Diagnosis not present

## 2017-10-03 DIAGNOSIS — B9620 Unspecified Escherichia coli [E. coli] as the cause of diseases classified elsewhere: Secondary | ICD-10-CM | POA: Insufficient documentation

## 2017-10-04 DIAGNOSIS — G301 Alzheimer's disease with late onset: Secondary | ICD-10-CM | POA: Diagnosis not present

## 2017-10-04 DIAGNOSIS — M47812 Spondylosis without myelopathy or radiculopathy, cervical region: Secondary | ICD-10-CM | POA: Diagnosis not present

## 2017-10-04 DIAGNOSIS — R1312 Dysphagia, oropharyngeal phase: Secondary | ICD-10-CM | POA: Diagnosis not present

## 2017-10-05 ENCOUNTER — Other Ambulatory Visit: Payer: Self-pay | Admitting: Internal Medicine

## 2017-10-05 DIAGNOSIS — R1310 Dysphagia, unspecified: Secondary | ICD-10-CM

## 2017-10-06 DIAGNOSIS — M47812 Spondylosis without myelopathy or radiculopathy, cervical region: Secondary | ICD-10-CM | POA: Diagnosis not present

## 2017-10-06 DIAGNOSIS — R1312 Dysphagia, oropharyngeal phase: Secondary | ICD-10-CM | POA: Diagnosis not present

## 2017-10-06 DIAGNOSIS — G301 Alzheimer's disease with late onset: Secondary | ICD-10-CM | POA: Diagnosis not present

## 2017-10-07 DIAGNOSIS — G301 Alzheimer's disease with late onset: Secondary | ICD-10-CM | POA: Diagnosis not present

## 2017-10-07 DIAGNOSIS — M47812 Spondylosis without myelopathy or radiculopathy, cervical region: Secondary | ICD-10-CM | POA: Diagnosis not present

## 2017-10-07 DIAGNOSIS — R1312 Dysphagia, oropharyngeal phase: Secondary | ICD-10-CM | POA: Diagnosis not present

## 2017-10-09 ENCOUNTER — Encounter
Admission: RE | Admit: 2017-10-09 | Discharge: 2017-10-09 | Disposition: A | Discharge status: Home / Self Care | Payer: Medicare Other | Source: Ambulatory Visit | Attending: Internal Medicine | Admitting: Internal Medicine

## 2017-10-10 DIAGNOSIS — R1312 Dysphagia, oropharyngeal phase: Secondary | ICD-10-CM | POA: Diagnosis not present

## 2017-10-10 DIAGNOSIS — M47812 Spondylosis without myelopathy or radiculopathy, cervical region: Secondary | ICD-10-CM | POA: Diagnosis not present

## 2017-10-10 DIAGNOSIS — G301 Alzheimer's disease with late onset: Secondary | ICD-10-CM | POA: Diagnosis not present

## 2017-10-11 DIAGNOSIS — R1312 Dysphagia, oropharyngeal phase: Secondary | ICD-10-CM | POA: Diagnosis not present

## 2017-10-11 DIAGNOSIS — G301 Alzheimer's disease with late onset: Secondary | ICD-10-CM | POA: Diagnosis not present

## 2017-10-11 DIAGNOSIS — M47812 Spondylosis without myelopathy or radiculopathy, cervical region: Secondary | ICD-10-CM | POA: Diagnosis not present

## 2017-10-12 DIAGNOSIS — M47812 Spondylosis without myelopathy or radiculopathy, cervical region: Secondary | ICD-10-CM | POA: Diagnosis not present

## 2017-10-12 DIAGNOSIS — G301 Alzheimer's disease with late onset: Secondary | ICD-10-CM | POA: Diagnosis not present

## 2017-10-12 DIAGNOSIS — R1312 Dysphagia, oropharyngeal phase: Secondary | ICD-10-CM | POA: Diagnosis not present

## 2017-10-13 DIAGNOSIS — M47812 Spondylosis without myelopathy or radiculopathy, cervical region: Secondary | ICD-10-CM | POA: Diagnosis not present

## 2017-10-13 DIAGNOSIS — R1312 Dysphagia, oropharyngeal phase: Secondary | ICD-10-CM | POA: Diagnosis not present

## 2017-10-13 DIAGNOSIS — G301 Alzheimer's disease with late onset: Secondary | ICD-10-CM | POA: Diagnosis not present

## 2017-10-14 ENCOUNTER — Ambulatory Visit: Payer: Medicare Other

## 2017-10-17 DIAGNOSIS — G301 Alzheimer's disease with late onset: Secondary | ICD-10-CM | POA: Diagnosis not present

## 2017-10-17 DIAGNOSIS — R1312 Dysphagia, oropharyngeal phase: Secondary | ICD-10-CM | POA: Diagnosis not present

## 2017-10-17 DIAGNOSIS — M47812 Spondylosis without myelopathy or radiculopathy, cervical region: Secondary | ICD-10-CM | POA: Diagnosis not present

## 2017-10-18 DIAGNOSIS — G301 Alzheimer's disease with late onset: Secondary | ICD-10-CM | POA: Diagnosis not present

## 2017-10-18 DIAGNOSIS — M47812 Spondylosis without myelopathy or radiculopathy, cervical region: Secondary | ICD-10-CM | POA: Diagnosis not present

## 2017-10-18 DIAGNOSIS — R1312 Dysphagia, oropharyngeal phase: Secondary | ICD-10-CM | POA: Diagnosis not present

## 2017-10-19 ENCOUNTER — Ambulatory Visit
Admission: RE | Admit: 2017-10-19 | Discharge: 2017-10-19 | Disposition: A | Discharge status: Home / Self Care | Payer: Medicare Other | Source: Ambulatory Visit | Attending: Internal Medicine | Admitting: Internal Medicine

## 2017-10-19 DIAGNOSIS — G309 Alzheimer's disease, unspecified: Secondary | ICD-10-CM | POA: Diagnosis not present

## 2017-10-19 DIAGNOSIS — M47812 Spondylosis without myelopathy or radiculopathy, cervical region: Secondary | ICD-10-CM | POA: Diagnosis not present

## 2017-10-19 DIAGNOSIS — G301 Alzheimer's disease with late onset: Secondary | ICD-10-CM | POA: Diagnosis not present

## 2017-10-19 DIAGNOSIS — F0280 Dementia in other diseases classified elsewhere without behavioral disturbance: Secondary | ICD-10-CM | POA: Insufficient documentation

## 2017-10-19 DIAGNOSIS — R1312 Dysphagia, oropharyngeal phase: Secondary | ICD-10-CM | POA: Insufficient documentation

## 2017-10-19 DIAGNOSIS — G039 Meningitis, unspecified: Secondary | ICD-10-CM | POA: Diagnosis not present

## 2017-10-19 DIAGNOSIS — F0390 Unspecified dementia without behavioral disturbance: Secondary | ICD-10-CM | POA: Diagnosis not present

## 2017-10-19 DIAGNOSIS — R1310 Dysphagia, unspecified: Secondary | ICD-10-CM | POA: Diagnosis not present

## 2017-10-19 NOTE — Therapy (Signed)
[place], [place], [address] Phone: [phone]   Fax:     Modified Barium Swallow  Patient Details  Name: Tony [name] MRN: [id number] Date of Birth: 08/04/1925 No data recorded  Encounter Date: 10/19/2017  End of Session - 10/19/17 1345    Visit Number  1    Number of Visits  1    Date for SLP Re-Evaluation  10/19/17    SLP Start Time  1245    SLP Stop Time   1345    SLP Time Calculation (min)  60 min    Activity Tolerance  Patient tolerated treatment well       Past Medical History:  Diagnosis Date  . Anemia   . Benign prostatic hyperplasia   . BPH without obstruction/lower urinary tract symptoms 03/27/2012  . Cervical vertebral fracture (HCC)    C7 with small subdural hematoma  . Dementia   . Depression 02/05/2015   unspecified  . Dysphagia   . Facial fracture ([place])   . Falling   . Glaucoma   . Hypertension   . Intracranial injury ([place])   . Macular degeneration    early/dry  . Malignant neoplasm of prostate ([place]) 05/22/2012  . Osteoarthritis   . POAG (primary open-angle glaucoma)   . Sleep apnea 02/04/2015  . Spondylosis of cervical joint   . Subdural hematoma ([place])   . Traumatic brain injury ([place])   . Urge incontinence 03/27/2012    Past Surgical History:  Procedure Laterality Date  . APPENDECTOMY    . AQUEOUS SHUNT Right 03/17/2015   Procedure:  . CHOLECYSTECTOMY     x 2  . GALLBLADDER SURGERY    . GLAUCOMA SURGERY Right 12/2006  . HERNIA REPAIR    . LENS SURGERY Right 12/29/2010   With i-stent  . LENS SURGERY Left 02/28/2011   With i-stent  . TONSILLECTOMY    . TURP VAPORIZATION      There were no vitals filed for this visit.   Subjective: Patient behavior: (alertness, ability to follow instructions, etc.):  The patient is alert and able to follow simple directions.  Chief complaint:  Per case manager with the patient, he has exhibited coughing with  meals.   Objective:  Radiological Procedure: A videoflouroscopic evaluation of oral-preparatory, reflex initiation, and pharyngeal phases of the swallow was performed; as well as a screening of the upper esophageal phase.  I. POSTURE: Upright in MBS chair  II. VIEW: Lateral  III. COMPENSATORY STRATEGIES: Elicited second swallow aids pharyngeal clearance.  Verbal cues aid attention to task of eating/swallowing  IV. BOLUSES ADMINISTERED:   Thin Liquid: 1 small, 3 rapid consecutive   Nectar-thick Liquid: 1 moderate   Honey-thick Liquid: DNT   Puree: 2 teaspoon presentations   Mechanical Soft: 1/4 graham cracker in applesauce  V. RESULTS OF EVALUATION: A. ORAL PREPARATORY PHASE: (The lips, tongue, and velum are observed for strength and coordination)       **Overall Severity Rating: Mild; slow/disorganized posterior transfer, inattention, piecemeal swallowing, pre-swallow spill into pharynx   B. SWALLOW INITIATION/REFLEX: (The reflex is normal if "triggered" by the time the bolus reached the base of the tongue)  **Overall Severity Rating: Mild; triggers while falling from the valleculae to the pyriform sinuses  C. PHARYNGEAL PHASE: (Pharyngeal function is normal if the bolus shows rapid, smooth, and continuous transit through the pharynx and there is no pharyngeal residue after the swallow)  **Overall Severity Rating: Mild, reduced  tongue base retraction and hyolaryngeal excursion with mild vallecular residue post swallow  D. LARYNGEAL PENETRATION: (Material entering into the laryngeal inlet/vestibule but not aspirated)  None  E. ASPIRATION: None  F. ESOPHAGEAL PHASE: (Screening of the upper esophagus) no observed abnormality within the viewable cervical esophagus  ASSESSMENT: This 84 year old man; with Alzheimer's dementia; is presenting with mild oropharyngeal dysphagia characterized by slow/disorganized posterior transfer, piecemeal swallowing, pre-swallow spill into the  pharynx, delayed pharyngeal swallow initiation, decreased pressure generation, and mild vallecular residue.   There was no observed laryngeal penetration or tracheal aspiration.  The patient does not appear to be at significant risk for prandial aspiration.  He was able to masticate a cracker in a soft substrate and take rapid consecutive boluses of thin liquid with no difficulties.  PLAN/RECOMMENDATIONS:   A. Diet: Regular diet   B. Swallowing Precautions: verbal cues to attend to swallowing   C. Recommended consultation to: N/A    D. Therapy recommendations: speech therapy follow up in his usual setting to determine appropriate diet consistency and feeding guidelines.     E. Results and recommendations were discussed with the patient and his case manager immediately following the study and the final report routed to the referring MD and treating SLP.   Patient will benefit from skilled therapeutic intervention in order to improve the following deficits and impairments:   Oropharyngeal dysphagia - Plan: DG OP Swallowing Func-Medicare/Speech Path, DG OP Swallowing Func-Medicare/Speech Path        Problem List Patient Active Problem List   Diagnosis Date Noted  . E-coli UTI 10/03/2017  . Chronic constipation 08/01/2016  . Oropharyngeal dysphagia 06/24/2016  . Spondylosis without myelopathy or radiculopathy, cervical region 06/24/2016  . Osteoarthrosis 06/24/2016  . Major depression, single episode 06/24/2016  . Anemia, unspecified 06/24/2016  . Hx of falling 06/24/2016  . Low back pain 06/24/2016  . Allergic rhinitis 06/24/2016  . Vitamin D deficiency 06/24/2016  . Vitamin B12 deficiency 06/24/2016  . Insomnia 06/24/2016  . Hypokalemia 06/24/2016  . Alzheimer's disease 04/14/2015  . Mood disorder ([place]) 02/05/2015  . Essential (primary) hypertension 02/05/2015  . Obstructive sleep apnea (adult) (pediatric) 02/04/2015  . Open-angle glaucoma 05/01/2014  . Benign prostatic  hyperplasia with urinary obstruction 05/22/2012  . Binocular vision disorder with diplopia 08/23/2011   [name], MS/CCC- SLP  [name] 10/19/2017, 1:46 PM  [facility] [place], [place], [address] Phone: [phone]   Fax:     Name: Tony [name] MRN: [id number] Date of Birth: 1925/06/03

## 2017-10-21 DIAGNOSIS — R1312 Dysphagia, oropharyngeal phase: Secondary | ICD-10-CM | POA: Diagnosis not present

## 2017-10-21 DIAGNOSIS — M47812 Spondylosis without myelopathy or radiculopathy, cervical region: Secondary | ICD-10-CM | POA: Diagnosis not present

## 2017-10-21 DIAGNOSIS — G301 Alzheimer's disease with late onset: Secondary | ICD-10-CM | POA: Diagnosis not present

## 2017-10-24 DIAGNOSIS — R1312 Dysphagia, oropharyngeal phase: Secondary | ICD-10-CM | POA: Diagnosis not present

## 2017-10-24 DIAGNOSIS — M47812 Spondylosis without myelopathy or radiculopathy, cervical region: Secondary | ICD-10-CM | POA: Diagnosis not present

## 2017-10-24 DIAGNOSIS — G301 Alzheimer's disease with late onset: Secondary | ICD-10-CM | POA: Diagnosis not present

## 2017-10-25 DIAGNOSIS — R1312 Dysphagia, oropharyngeal phase: Secondary | ICD-10-CM | POA: Diagnosis not present

## 2017-10-25 DIAGNOSIS — G301 Alzheimer's disease with late onset: Secondary | ICD-10-CM | POA: Diagnosis not present

## 2017-10-25 DIAGNOSIS — M47812 Spondylosis without myelopathy or radiculopathy, cervical region: Secondary | ICD-10-CM | POA: Diagnosis not present

## 2017-10-26 DIAGNOSIS — M47812 Spondylosis without myelopathy or radiculopathy, cervical region: Secondary | ICD-10-CM | POA: Diagnosis not present

## 2017-10-26 DIAGNOSIS — G301 Alzheimer's disease with late onset: Secondary | ICD-10-CM | POA: Diagnosis not present

## 2017-10-26 DIAGNOSIS — R1312 Dysphagia, oropharyngeal phase: Secondary | ICD-10-CM | POA: Diagnosis not present

## 2017-10-28 DIAGNOSIS — M47812 Spondylosis without myelopathy or radiculopathy, cervical region: Secondary | ICD-10-CM | POA: Diagnosis not present

## 2017-10-28 DIAGNOSIS — G301 Alzheimer's disease with late onset: Secondary | ICD-10-CM | POA: Diagnosis not present

## 2017-10-28 DIAGNOSIS — R1312 Dysphagia, oropharyngeal phase: Secondary | ICD-10-CM | POA: Diagnosis not present

## 2017-10-29 DIAGNOSIS — M47812 Spondylosis without myelopathy or radiculopathy, cervical region: Secondary | ICD-10-CM | POA: Diagnosis not present

## 2017-10-29 DIAGNOSIS — R1312 Dysphagia, oropharyngeal phase: Secondary | ICD-10-CM | POA: Diagnosis not present

## 2017-10-29 DIAGNOSIS — G301 Alzheimer's disease with late onset: Secondary | ICD-10-CM | POA: Diagnosis not present

## 2017-10-31 DIAGNOSIS — R1312 Dysphagia, oropharyngeal phase: Secondary | ICD-10-CM | POA: Diagnosis not present

## 2017-10-31 DIAGNOSIS — M47812 Spondylosis without myelopathy or radiculopathy, cervical region: Secondary | ICD-10-CM | POA: Diagnosis not present

## 2017-10-31 DIAGNOSIS — G301 Alzheimer's disease with late onset: Secondary | ICD-10-CM | POA: Diagnosis not present

## 2017-11-01 DIAGNOSIS — G301 Alzheimer's disease with late onset: Secondary | ICD-10-CM | POA: Diagnosis not present

## 2017-11-01 DIAGNOSIS — M47812 Spondylosis without myelopathy or radiculopathy, cervical region: Secondary | ICD-10-CM | POA: Diagnosis not present

## 2017-11-01 DIAGNOSIS — R1312 Dysphagia, oropharyngeal phase: Secondary | ICD-10-CM | POA: Diagnosis not present

## 2017-11-02 DIAGNOSIS — M47812 Spondylosis without myelopathy or radiculopathy, cervical region: Secondary | ICD-10-CM | POA: Diagnosis not present

## 2017-11-02 DIAGNOSIS — R1312 Dysphagia, oropharyngeal phase: Secondary | ICD-10-CM | POA: Diagnosis not present

## 2017-11-02 DIAGNOSIS — G301 Alzheimer's disease with late onset: Secondary | ICD-10-CM | POA: Diagnosis not present

## 2017-11-03 DIAGNOSIS — M47812 Spondylosis without myelopathy or radiculopathy, cervical region: Secondary | ICD-10-CM | POA: Diagnosis not present

## 2017-11-03 DIAGNOSIS — R1312 Dysphagia, oropharyngeal phase: Secondary | ICD-10-CM | POA: Diagnosis not present

## 2017-11-03 DIAGNOSIS — G301 Alzheimer's disease with late onset: Secondary | ICD-10-CM | POA: Diagnosis not present

## 2017-11-04 DIAGNOSIS — M47812 Spondylosis without myelopathy or radiculopathy, cervical region: Secondary | ICD-10-CM | POA: Diagnosis not present

## 2017-11-04 DIAGNOSIS — R1312 Dysphagia, oropharyngeal phase: Secondary | ICD-10-CM | POA: Diagnosis not present

## 2017-11-04 DIAGNOSIS — G301 Alzheimer's disease with late onset: Secondary | ICD-10-CM | POA: Diagnosis not present

## 2017-11-08 ENCOUNTER — Encounter
Admission: RE | Admit: 2017-11-08 | Discharge: 2017-11-08 | Disposition: A | Discharge status: Home / Self Care | Payer: Medicare Other | Source: Ambulatory Visit | Attending: Internal Medicine | Admitting: Internal Medicine

## 2017-11-17 ENCOUNTER — Encounter: Payer: Self-pay | Admitting: Adult Health

## 2017-11-17 ENCOUNTER — Non-Acute Institutional Stay: Payer: Medicare Other | Admitting: Adult Health

## 2017-11-17 DIAGNOSIS — F0280 Dementia in other diseases classified elsewhere without behavioral disturbance: Secondary | ICD-10-CM | POA: Diagnosis not present

## 2017-11-17 DIAGNOSIS — G4733 Obstructive sleep apnea (adult) (pediatric): Secondary | ICD-10-CM

## 2017-11-17 DIAGNOSIS — I10 Essential (primary) hypertension: Secondary | ICD-10-CM

## 2017-11-17 DIAGNOSIS — G301 Alzheimer's disease with late onset: Secondary | ICD-10-CM | POA: Diagnosis not present

## 2017-11-17 DIAGNOSIS — J309 Allergic rhinitis, unspecified: Secondary | ICD-10-CM

## 2017-11-17 NOTE — Progress Notes (Signed)
[facility]   [facility] Room Number: [place]:  ALF (13)   CODE STATUS: DNR  No Known Allergies  Chief Complaint  Patient presents with  . Medical Management of Chronic Issues    Essential (primary) hypertension; obstructive sleep apnea; allergic rhinitis; late onset alzheimer's disease without behavioral dis    HPI:  He is a 84 year old long term resident of assisted living being seen for the management of his chronic illnesses: hypertension; OSA; allergic rhinitis; alzheimer's. He is unable to participate in the hpi or ros. There are no reports of changes in appetite; no reports of agitation or anxiety no reports of uncontrolled pain.   Past Medical History:  Diagnosis Date  . Anemia   . Benign prostatic hyperplasia   . BPH without obstruction/lower urinary tract symptoms 03/27/2012  . Cervical vertebral fracture (HCC)    C7 with small subdural hematoma  . Dementia ([place])   . Depression 02/05/2015   unspecified  . Dysphagia   . Facial fracture ([place])   . Falling   . Glaucoma   . Hypertension   . Intracranial injury ([place])   . Macular degeneration    early/dry  . Malignant neoplasm of prostate ([place]) 05/22/2012  . Osteoarthritis   . POAG (primary open-angle glaucoma)   . Sleep apnea 02/04/2015  . Spondylosis of cervical joint   . Subdural hematoma ([place])   . Traumatic brain injury ([place])   . Urge incontinence 03/27/2012    Past Surgical History:  Procedure Laterality Date  . APPENDECTOMY    . AQUEOUS SHUNT Right 03/17/2015   Procedure:  . CHOLECYSTECTOMY     x 2  . GALLBLADDER SURGERY    . GLAUCOMA SURGERY Right 12/2006  . HERNIA REPAIR    . LENS SURGERY Right 12/29/2010   With i-stent  . LENS SURGERY Left 02/28/2011   With i-stent  . TONSILLECTOMY    . TURP VAPORIZATION      Social History   Socioeconomic History  . Marital status: Married    Spouse name: Not on file  . Number of children: 3  . Years of  education: 12+  . Highest education level: Master's degree (e.g., MA, MS, MEng, MEd, MSW, MBA)  Occupational History    Comment: retired Community education officer  Social Needs  . Financial resource strain: Not on file  . Food insecurity:    Worry: Not on file    Inability: Not on file  . Transportation needs:    Medical: Not on file    Non-medical: Not on file  Tobacco Use  . Smoking status: Never Smoker  . Smokeless tobacco: Never Used  Substance and Sexual Activity  . Alcohol use: Yes    Alcohol/week: 1.0 standard drinks    Types: 1 Cans of beer per week    Comment: very little  . Drug use: No  . Sexual activity: Not on file  Lifestyle  . Physical activity:    Days per week: Not on file    Minutes per session: Not on file  . Stress: Not on file  Relationships  . Social connections:    Talks on phone: Not on file    Gets together: Not on file    Attends religious service: Not on file    Active member of club or organization: Not on file    Attends meetings of clubs or organizations: Not on file    Relationship status: Not on file  .  Intimate partner violence:    Fear of current or ex partner: Not on file    Emotionally abused: Not on file    Physically abused: Not on file    Forced sexual activity: Not on file  Other Topics Concern  . Not on file  Social History Narrative   Admitted to [facility] 10/18/2011   Married   Drinks 1 beer weekly    Never Smoker   DNR   Family History  Problem Relation Age of Onset  . Hypertension Father   . Anesthesia problems Neg Hx   . Cataracts Neg Hx   . Glaucoma Neg Hx   . Macular degeneration Neg Hx   . GU problems Neg Hx   . Kidney disease Neg Hx   . Prostate cancer Neg Hx       VITAL SIGNS BP (!) 164/67   Pulse 68   Temp (!) 96 F (35.6 C)   Ht 5\' 5"  (1.651 m)   Wt 171 lb 14.4 oz (78 kg)   SpO2 100%   BMI 28.61 kg/m   Outpatient Encounter Medications as of 11/17/2017  Medication Sig  . acetaminophen (MAPAP  ARTHRITIS PAIN) 650 MG CR tablet Take 1,300 mg by mouth 2 (two) times daily.  Marland Kitchen amLODipine (NORVASC) 5 MG tablet Take 5 mg by mouth daily.   Marland Kitchen aspirin EC 81 MG tablet Take 81 mg by mouth daily.   . Azelastine HCl (ASTEPRO) 0.15 % SOLN Place 2 sprays into both nostrils at bedtime.   . brimonidine (ALPHAGAN) 0.2 % ophthalmic solution Place 1 drop into both eyes 2 (two) times daily. Per Dr. Laveda Norman, MD  . carvedilol (COREG) 6.25 MG tablet Take 6.25 mg by mouth every 12 (twelve) hours.   . chlorhexidine (PERIDEX) 0.12 % solution Use as directed 10 mLs in the mouth or throat 2 (two) times daily.  . Cholecalciferol (VITAMIN D3) 2000 units capsule Take 4,000 Units by mouth daily. 2 caps  . citalopram (CELEXA) 20 MG tablet Take 1 tablet (20 mg total) by mouth daily.  . coal tar (NEUTROGENA T-GEL) 0.5 % shampoo Apply 1 application topically once a week. On thursdays  . dorzolamide-timolol (COSOPT) 22.3-6.8 MG/ML ophthalmic solution Place 1 drop into both eyes 2 (two) times daily. Per Dr. Arlis Porta. Wait 5 mins between admin of multiple eye drops  . folic acid (FOLVITE) 397 MCG tablet Take 400 mcg by mouth daily.   Marland Kitchen lidocaine (ASPERCREME W/LIDOCAINE) 4 % cream Apply 1 application topically 3 (three) times daily as needed. Apply thin film to lumbo-sacral area for pain  . loperamide (IMODIUM) 2 MG capsule Give 4 mg by mouth prn for loose stools and 2 mg after each subsequent loose stool up to 8 doses in 24 hours  . magnesium hydroxide (MILK OF MAGNESIA) 400 MG/5ML suspension Take 30 mLs by mouth daily as needed for mild constipation. For constipation no results in 24 hours may administer bisacodyl supp  . memantine (NAMENDA) 5 MG tablet Take 5 mg by mouth 2 (two) times daily.  . mometasone (NASONEX) 50 MCG/ACT nasal spray Place 2 sprays into the nose every morning.   . potassium chloride (MICRO-K) 10 MEQ CR capsule Take 10 mEq by mouth 2 (two) times daily.   . sennosides-docusate sodium (SENOKOT-S) 8.6-50 MG  tablet Take 1 tablet by mouth at bedtime.  . Skin Protectants, Misc. (ENDIT EX) Apply liberal amount topically to areas of skin irritation as needed. Ok to leave at bedside.  Marland Kitchen  tamsulosin (FLOMAX) 0.4 MG CAPS capsule Take 0.4 mg by mouth daily.  . traMADol (ULTRAM) 50 MG tablet Take 50-100 mg by mouth every 4 (four) hours as needed. pain; 1- moderate, 2- severe pain  . traZODone (DESYREL) 50 MG tablet Take 50 mg by mouth at bedtime. 1/2 tab  for insomnia. OK to hold if pt is sleeping   . UNABLE TO FIND Regular Diet:  Regular consistency, continue thin liquids  . vitamin B-12 (CYANOCOBALAMIN) 1000 MCG tablet Take 1,000 mcg by mouth daily.    No facility-administered encounter medications on file as of 11/17/2017.      SIGNIFICANT DIAGNOSTIC EXAMS  LABS REVIEWED: PREVIOUS :   05-05-17: wbc 6.4; hgb 11.9; hct 35.5; mcv 92.8; plt 241; glucose 96; bun 21; creat 0.99; k+ 3.6; na++ 140; ca 8.5; liver normal albumin 3.4 mag 2.3 tsh 2.221; vit B 12: 991; vit D 23.1  09-19-17: urine culture e-coli (not final) treat with doxycycline   NO NEW LABS.   Review of Systems  Unable to perform ROS: Dementia (confusion )   Physical Exam  Constitutional: He appears well-developed and well-nourished. No distress.  Neck: No thyromegaly present.  Cardiovascular: Normal rate, regular rhythm, normal heart sounds and intact distal pulses.  Pulmonary/Chest: Effort normal and breath sounds normal. No respiratory distress.  Abdominal: Soft. Bowel sounds are normal. He exhibits no distension. There is no tenderness.  Musculoskeletal: He exhibits no edema.  Able to move all extremities   Lymphadenopathy:    He has no cervical adenopathy.  Neurological: He is alert.  Skin: Skin is warm and dry. He is not diaphoretic.  Psychiatric: He has a normal mood and affect.     ASSESSMENT/ PLAN:   TODAY:   1. Essential (primary) hypertension: is stable 164/67: will continue norvasc 5 mg daily coreg 6.25 mg twice  daily asa 81 mg daily   2. OSA is stable is not using cpap; will monitor  3.  Allergic rhinitis: is stable will continue astepro 2 pufs every night and nasonex daily   4. Late onset alzheimer's disease without behavioral disturbance: is without changes: weight is 171pounds; will continue namenda 5 mg twice daily and will continue to monitor his status.   PREVIOUS   5. Spondylosis without myelopathy or radiculopathy; cervical region: is stable will continue lidocaine 4% to back three times daily as needed has ultram 50 or 100 mg every 4 hours as needed   6.  Benign prostatic hyperplasia with urinary obstruction: is stable will continue flomax 0.4 mg daily   7.  Hypokalemia: stable k+ 3.8; will continue k+ 10 meq twice daily   8. Bilateral open-angle glaucoma: is stable will continue alphagan to both eyes twice daily cosopt to both eyes twice daily   9. Chronic back pain: is stable will continue tylenol 1300 mg twice daily   10. Major depression single episode partial remission: is worse  will continue celexa 20 mg daily  trazodone  50 mg nightly to help with sleep   11. Chronic constipation: is stable will continue senna s nightly   Will check cbc cmp; vit D mag Will stop vit B 12    MD is aware of resident's narcotic use and is in agreement with current plan of care. We will attempt to wean resident as apropriate   Ok Edwards NP [facility]  Contact [phone] Monday through Friday 8am- 5pm  After hours call [phone]

## 2017-11-18 ENCOUNTER — Other Ambulatory Visit
Admission: RE | Admit: 2017-11-18 | Discharge: 2017-11-18 | Disposition: A | Discharge status: Home / Self Care | Payer: Medicare Other | Source: Ambulatory Visit | Attending: Adult Health | Admitting: Adult Health

## 2017-11-18 DIAGNOSIS — I10 Essential (primary) hypertension: Secondary | ICD-10-CM | POA: Diagnosis not present

## 2017-11-18 LAB — COMPREHENSIVE METABOLIC PANEL
ALBUMIN: 3.9 g/dL (ref 3.5–5.0)
ALT: 16 U/L (ref 0–44)
ANION GAP: 6 (ref 5–15)
AST: 17 U/L (ref 15–41)
Alkaline Phosphatase: 66 U/L (ref 38–126)
BUN: 23 mg/dL (ref 8–23)
CO2: 25 mmol/L (ref 22–32)
Calcium: 8.7 mg/dL — ABNORMAL LOW (ref 8.9–10.3)
Chloride: 107 mmol/L (ref 98–111)
Creatinine, Ser: 1.02 mg/dL (ref 0.61–1.24)
GFR calc Af Amer: >60 mL/min (ref >60)
GFR calc non Af Amer: >60 mL/min (ref >60)
GLUCOSE: 139 mg/dL — AB (ref 70–99)
POTASSIUM: 3.8 mmol/L (ref 3.5–5.1)
SODIUM: 138 mmol/L (ref 135–145)
Total Bilirubin: 0.4 mg/dL (ref 0.3–1.2)
Total Protein: 7 g/dL (ref 6.5–8.1)

## 2017-11-18 LAB — CBC WITH DIFFERENTIAL/PLATELET
Abs Immature Granulocytes: 0.03 10*3/uL (ref 0.00–0.07)
BASOS ABS: 0 10*3/uL (ref 0.0–0.1)
BASOS PCT: 1 %
EOS ABS: 0.3 10*3/uL (ref 0.0–0.5)
EOS PCT: 4 %
HCT: 38.9 % — ABNORMAL LOW (ref 39.0–52.0)
Hemoglobin: 12.5 g/dL — ABNORMAL LOW (ref 13.0–17.0)
Immature Granulocytes: 1 %
LYMPHS PCT: 22 %
Lymphs Abs: 1.4 10*3/uL (ref 0.7–4.0)
MCH: 30.3 pg (ref 26.0–34.0)
MCHC: 32.1 g/dL (ref 30.0–36.0)
MCV: 94.4 fL (ref 80.0–100.0)
Monocytes Absolute: 0.5 10*3/uL (ref 0.1–1.0)
Monocytes Relative: 8 %
NRBC: 0 % (ref 0.0–0.2)
Neutro Abs: 4.2 10*3/uL (ref 1.7–7.7)
Neutrophils Relative %: 64 %
PLATELETS: 364 10*3/uL (ref 150–400)
RBC: 4.12 MIL/uL — AB (ref 4.22–5.81)
RDW: 13.6 % (ref 11.5–15.5)
WBC: 6.5 10*3/uL (ref 4.0–10.5)

## 2017-11-18 LAB — MAGNESIUM: Magnesium: 2.3 mg/dL (ref 1.7–2.4)

## 2017-11-19 LAB — VITAMIN D 25 HYDROXY (VIT D DEFICIENCY, FRACTURES): VIT D 25 HYDROXY: 32.8 ng/mL (ref 30.0–100.0)

## 2017-11-29 DIAGNOSIS — F325 Major depressive disorder, single episode, in full remission: Secondary | ICD-10-CM | POA: Diagnosis not present

## 2017-11-29 DIAGNOSIS — G301 Alzheimer's disease with late onset: Secondary | ICD-10-CM | POA: Diagnosis not present

## 2017-11-29 DIAGNOSIS — F0280 Dementia in other diseases classified elsewhere without behavioral disturbance: Secondary | ICD-10-CM | POA: Diagnosis not present

## 2017-11-29 DIAGNOSIS — I5032 Chronic diastolic (congestive) heart failure: Secondary | ICD-10-CM | POA: Diagnosis not present

## 2017-12-01 ENCOUNTER — Non-Acute Institutional Stay: Payer: Medicare Other | Admitting: Adult Health

## 2017-12-01 DIAGNOSIS — R062 Wheezing: Secondary | ICD-10-CM | POA: Diagnosis not present

## 2017-12-01 DIAGNOSIS — R05 Cough: Secondary | ICD-10-CM

## 2017-12-01 DIAGNOSIS — R059 Cough, unspecified: Secondary | ICD-10-CM

## 2017-12-01 NOTE — Progress Notes (Signed)
Location:  [facility] Room Number: [id number] Place of Service:  ALF [phone]) Provider:  Durenda Age, NP  Patient Care Team: Kirk Ruths, MD as PCP - General (Internal Medicine) Gerlene Fee, NP as Nurse Practitioner (Geriatric Medicine)  Extended Emergency Contact Information Primary Emergency Contact: [facility] Address: [place] [address] [name] of [place] Phone: [phone] Work Phone: [phone] Mobile Phone: [phone] Relation: Son Secondary Emergency Contact: Nelia Shi Address: [address]          [place], [place] [address] [name] of [place] Phone: [phone] Work Phone: [phone] Mobile Phone: [phone] Relation: Daughter  Code Status:  DNR  Goals of care: Advanced Directive information Advanced Directives 12/06/2017  Does Patient Have a Medical Advance Directive? Yes  Type of Advance Directive Out of facility DNR (pink MOST or yellow form)  Does patient want to make changes to medical advance directive? No - Patient declined  Copy of [place] in Chart? -  Pre-existing out of facility DNR order (yellow form or pink MOST form) Yellow form placed in chart (order not valid for inpatient use)     Chief Complaint  Patient presents with  . Acute Visit    Nurse states patient has URI symptoms of rhonchi/wheezing, and copious yellow/green nasal drainage.    HPI:  Pt is a 84 y.o. male seen today for medical management of chronic diseases. He was seen in his room today. He was noted to have audible wheezing without using the stethoscope. He was having moderate amount of greenish-yellowish nasal drainage. He was reported to have low grade fever last night.   Past Medical History:  Diagnosis Date  . Anemia   . Benign prostatic hyperplasia   . BPH without obstruction/lower urinary tract symptoms 03/27/2012  . Cervical vertebral fracture  (HCC)    C7 with small subdural hematoma  . Dementia ([place])   . Depression 02/05/2015   unspecified  . Dysphagia   . Facial fracture ([place])   . Falling   . Glaucoma   . Hypertension   . Intracranial injury ([place])   . Macular degeneration    early/dry  . Malignant neoplasm of prostate ([place]) 05/22/2012  . Osteoarthritis   . POAG (primary open-angle glaucoma)   . Sleep apnea 02/04/2015  . Spondylosis of cervical joint   . Subdural hematoma ([place])   . Traumatic brain injury ([place])   . Urge incontinence 03/27/2012   Past Surgical History:  Procedure Laterality Date  . APPENDECTOMY    . AQUEOUS SHUNT Right 03/17/2015   Procedure:  . CHOLECYSTECTOMY     x 2  . GALLBLADDER SURGERY    . GLAUCOMA SURGERY Right 12/2006  . HERNIA REPAIR    . LENS SURGERY Right 12/29/2010   With i-stent  . LENS SURGERY Left 02/28/2011   With i-stent  . TONSILLECTOMY    . TURP VAPORIZATION      No Known Allergies  Outpatient Encounter Medications as of 12/01/2017  Medication Sig  . acetaminophen (MAPAP ARTHRITIS PAIN) 650 MG CR tablet Take 1,300 mg by mouth 2 (two) times daily.  Marland Kitchen amLODipine (NORVASC) 5 MG tablet Take 10 mg by mouth daily.   Marland Kitchen aspirin EC 81 MG tablet Take 81 mg by mouth daily.   . Azelastine HCl (ASTEPRO) 0.15 % SOLN Place 2 sprays into both nostrils at bedtime.   . brimonidine (ALPHAGAN) 0.2 %  ophthalmic solution Place 1 drop into both eyes 2 (two) times daily. Per Dr. Laveda Norman, MD  . carvedilol (COREG) 6.25 MG tablet Take 6.25 mg by mouth every 12 (twelve) hours.   . chlorhexidine (PERIDEX) 0.12 % solution Use as directed 10 mLs in the mouth or throat 2 (two) times daily.  . Cholecalciferol (VITAMIN D3) 2000 units capsule Take 4,000 Units by mouth daily. 2 caps  . citalopram (CELEXA) 20 MG tablet Take 1 tablet (20 mg total) by mouth daily.  . coal tar (NEUTROGENA T-GEL) 0.5 % shampoo Apply 1 application topically once a week. On thursdays  . dorzolamide-timolol (COSOPT) 22.3-6.8  MG/ML ophthalmic solution Place 1 drop into both eyes 2 (two) times daily. Per Dr. Arlis Porta. Wait 5 mins between admin of multiple eye drops  . folic acid (FOLVITE) 440 MCG tablet Take 400 mcg by mouth daily.   Marland Kitchen guaiFENesin (ROBITUSSIN) 100 MG/5ML liquid Take 200 mg by mouth 3 (three) times daily.   Marland Kitchen ipratropium-albuterol (DUONEB) 0.5-2.5 (3) MG/3ML SOLN Take 3 mLs by nebulization 3 (three) times daily.  Marland Kitchen lidocaine (ASPERCREME W/LIDOCAINE) 4 % cream Apply 1 application topically 3 (three) times daily as needed. Apply thin film to lumbo-sacral area for pain  . loperamide (IMODIUM) 2 MG capsule Give 4 mg by mouth prn for loose stools and 2 mg after each subsequent loose stool up to 8 doses in 24 hours  . magnesium hydroxide (MILK OF MAGNESIA) 400 MG/5ML suspension Take 30 mLs by mouth daily as needed for mild constipation. For constipation no results in 24 hours may administer bisacodyl supp  . memantine (NAMENDA) 5 MG tablet Take 5 mg by mouth 2 (two) times daily.  . mometasone (NASONEX) 50 MCG/ACT nasal spray Place 2 sprays into the nose every morning.   . potassium chloride (MICRO-K) 10 MEQ CR capsule Take 10 mEq by mouth 2 (two) times daily.   . sennosides-docusate sodium (SENOKOT-S) 8.6-50 MG tablet Take 1 tablet by mouth at bedtime.  . Skin Protectants, Misc. (ENDIT EX) Apply liberal amount topically to areas of skin irritation as needed. Ok to leave at bedside.  . tamsulosin (FLOMAX) 0.4 MG CAPS capsule Take 0.4 mg by mouth daily.  . traZODone (DESYREL) 50 MG tablet Take 50 mg by mouth at bedtime. OK to hold if pt is sleeping  . UNABLE TO FIND Regular Diet:  Regular consistency, continue thin liquids  . [DISCONTINUED] traMADol (ULTRAM) 50 MG tablet Take 50-100 mg by mouth every 4 (four) hours as needed. pain; 1- moderate, 2- severe pain  . [DISCONTINUED] vitamin B-12 (CYANOCOBALAMIN) 1000 MCG tablet Take 1,000 mcg by mouth daily.    No facility-administered encounter medications on file as  of 12/01/2017.     Review of Systems  GENERAL:+fever MOUTH and THROAT: Denies oral discomfort, gingival pain or bleeding RESPIRATORY: +cough and wheezing CARDIAC: No chest pain, edema or palpitations GI: No abdominal pain, diarrhea, constipation, heart burn, nausea or vomiting GU: Denies dysuria, frequency, hematuria, incontinence, or discharge PSYCHIATRIC: Denies feelings of depression or anxiety. No report of hallucinations, insomnia, paranoia, or agitation    Immunization History  Administered Date(s) Administered  . Influenza, High Dose Seasonal PF 11/25/2016  . Influenza-Unspecified 11/14/2015, 11/02/2016  . PPD Test 03/05/2016, 03/14/2017  . Pneumococcal Conjugate-13 09/27/2017   Pertinent  Health Maintenance Due  Topic Date Due  . INFLUENZA VACCINE  12/19/2017 (Originally 09/08/2017)  . PNA vac Low Risk Adult (2 of 2 - PPSV23) 09/28/2018   No flowsheet data found. Functional  Status Survey:    Vitals:   12/01/17 1033  BP: 136/62  Pulse: 67  Weight: 171 lb 14.4 oz (78 kg)  Height: 5\' 5"  (1.651 m)   Body mass index is 28.61 kg/m.  Physical Exam  GENERAL APPEARANCE: Well nourished. In no acute distress. Normal body habitus SKIN:  Skin is warm and dry.  MOUTH and THROAT: Lips are without lesions. Oral mucosa is moist and without lesions.  RESPIRATORY: + wheezing CARDIAC: RRR, no murmur,no extra heart sounds, no edema GI: Abdomen soft, normal BS, no masses, no tenderness EXTREMITIES:  Able to move X 4 extremities PSYCHIATRIC: Alert to self, disoriented to time and place. Affect and behavior are appropriate  Labs reviewed: Recent Labs    05/05/17 0838 11/18/17 1327  NA 140 138  K 3.6 3.8  CL 108 107  CO2 23 25  GLUCOSE 96 139*  BUN 21* 23  CREATININE 0.99 1.02  CALCIUM 8.5* 8.7*  MG 2.3 2.3   Recent Labs    05/05/17 0838 11/18/17 1327  AST 18 17  ALT 17 16  ALKPHOS 51 66  BILITOT 0.6 0.4  PROT 6.2* 7.0  ALBUMIN 3.4* 3.9   Recent Labs     05/05/17 0838 11/18/17 1327  WBC 6.4 6.5  NEUTROABS 3.8 4.2  HGB 11.9* 12.5*  HCT 35.5* 38.9*  MCV 92.8 94.4  PLT 241 364   Lab Results  Component Value Date   TSH 2.221 05/05/2017     Assessment/Plan  1. Cough -will start Robitussin 100 mg / 5 mL give 10 mL p.o. every 6 a.m., 2 PM, and 10 PM x2 weeks   2. Wheezing -will start albuterol 2.5 mg / 3 mL 1 neb every 6 AM, 2 PM and 10 PM x1 week, will do chest x-ray to rule out pneumonia    Family/ staff Communication: Discussed plan of care with resident and charge nurse.  Labs/tests ordered:  Chest x-ray PA and lateral  Goals of care:   Long-term care   Durenda Age, NP [facility] [phone] (Monday-Friday 8:00 a.m. - 5:00 p.m.) [phone] (after hours)

## 2017-12-02 ENCOUNTER — Other Ambulatory Visit: Payer: Self-pay | Admitting: Adult Health

## 2017-12-02 MED ORDER — TRAMADOL HCL 50 MG PO TABS: 50.0000 mg | ORAL_TABLET | ORAL | 0 refills | Status: DC | PRN

## 2017-12-06 ENCOUNTER — Encounter: Payer: Self-pay | Admitting: Adult Health

## 2017-12-06 ENCOUNTER — Non-Acute Institutional Stay (SKILLED_NURSING_FACILITY): Payer: Medicare Other | Admitting: Adult Health

## 2017-12-06 DIAGNOSIS — Y95 Nosocomial condition: Secondary | ICD-10-CM

## 2017-12-06 DIAGNOSIS — J189 Pneumonia, unspecified organism: Secondary | ICD-10-CM | POA: Diagnosis not present

## 2017-12-06 DIAGNOSIS — R1312 Dysphagia, oropharyngeal phase: Secondary | ICD-10-CM | POA: Diagnosis not present

## 2017-12-06 NOTE — Progress Notes (Signed)
[facility]   [facility] Room Number: [place] of Service:  SNF (31)   CODE STATUS: DNR  No Known Allergies  Chief Complaint  Patient presents with  . Acute Visit    Fever / cough / congestion    HPI:   He is presently being treated for pneumonia. Staff reports that he continues to have a congested cough; is running low grade temps; and continues to have wheezing. He is presently being treated with ceftin for pneumonia by chest x-ray. He is unable to fully participate in the hpi or ros.   Past Medical History:  Diagnosis Date  . Anemia   . Benign prostatic hyperplasia   . BPH without obstruction/lower urinary tract symptoms 03/27/2012  . Cervical vertebral fracture (HCC)    C7 with small subdural hematoma  . Dementia ([place])   . Depression 02/05/2015   unspecified  . Dysphagia   . Facial fracture ([place])   . Falling   . Glaucoma   . Hypertension   . Intracranial injury ([place])   . Macular degeneration    early/dry  . Malignant neoplasm of prostate ([place]) 05/22/2012  . Osteoarthritis   . POAG (primary open-angle glaucoma)   . Sleep apnea 02/04/2015  . Spondylosis of cervical joint   . Subdural hematoma ([place])   . Traumatic brain injury ([place])   . Urge incontinence 03/27/2012    Past Surgical History:  Procedure Laterality Date  . APPENDECTOMY    . AQUEOUS SHUNT Right 03/17/2015   Procedure:  . CHOLECYSTECTOMY     x 2  . GALLBLADDER SURGERY    . GLAUCOMA SURGERY Right 12/2006  . HERNIA REPAIR    . LENS SURGERY Right 12/29/2010   With i-stent  . LENS SURGERY Left 02/28/2011   With i-stent  . TONSILLECTOMY    . TURP VAPORIZATION      Social History   Socioeconomic History  . Marital status: Married    Spouse name: Not on file  . Number of children: 3  . Years of education: 12+  . Highest education level: Master's degree (e.g., MA, MS, MEng, MEd, MSW, MBA)  Occupational History    Comment: retired Community education officer  Social Needs  .  Financial resource strain: Not on file  . Food insecurity:    Worry: Not on file    Inability: Not on file  . Transportation needs:    Medical: Not on file    Non-medical: Not on file  Tobacco Use  . Smoking status: Never Smoker  . Smokeless tobacco: Never Used  Substance and Sexual Activity  . Alcohol use: Yes    Alcohol/week: 1.0 standard drinks    Types: 1 Cans of beer per week    Comment: very little  . Drug use: No  . Sexual activity: Not on file  Lifestyle  . Physical activity:    Days per week: Not on file    Minutes per session: Not on file  . Stress: Not on file  Relationships  . Social connections:    Talks on phone: Not on file    Gets together: Not on file    Attends religious service: Not on file    Active member of club or organization: Not on file    Attends meetings of clubs or organizations: Not on file    Relationship status: Not on file  . Intimate partner violence:    Fear of current or ex partner: Not on file  Emotionally abused: Not on file    Physically abused: Not on file    Forced sexual activity: Not on file  Other Topics Concern  . Not on file  Social History Narrative   Admitted to [place] [facility] 10/18/2011   Married   Drinks 1 beer weekly    Never Smoker   DNR   Family History  Problem Relation Age of Onset  . Hypertension Father   . Anesthesia problems Neg Hx   . Cataracts Neg Hx   . Glaucoma Neg Hx   . Macular degeneration Neg Hx   . GU problems Neg Hx   . Kidney disease Neg Hx   . Prostate cancer Neg Hx       VITAL SIGNS BP 138/76   Pulse 66   Temp 98.1 F (36.7 C)   Ht 5\' 5"  (1.651 m)   Wt 171 lb 14.4 oz (78 kg)   BMI 28.61 kg/m   Outpatient Encounter Medications as of 12/06/2017  Medication Sig  . acetaminophen (MAPAP ARTHRITIS PAIN) 650 MG CR tablet Take 1,300 mg by mouth 2 (two) times daily.  Marland Kitchen albuterol (PROVENTIL) (2.5 MG/3ML) 0.083% nebulizer solution Take 2.5 mg by nebulization every 4 (four) hours as  needed for wheezing or shortness of breath.  Marland Kitchen amLODipine (NORVASC) 5 MG tablet Take 10 mg by mouth daily.   Marland Kitchen aspirin EC 81 MG tablet Take 81 mg by mouth daily.   . Azelastine HCl (ASTEPRO) 0.15 % SOLN Place 2 sprays into both nostrils at bedtime.   . brimonidine (ALPHAGAN) 0.2 % ophthalmic solution Place 1 drop into both eyes 2 (two) times daily. Per Dr. Laveda Norman, MD  . carvedilol (COREG) 6.25 MG tablet Take 6.25 mg by mouth every 12 (twelve) hours.   . cefUROXime (CEFTIN) 500 MG tablet Take 500 mg by mouth 2 (two) times daily with a meal.  . chlorhexidine (PERIDEX) 0.12 % solution Use as directed 10 mLs in the mouth or throat 2 (two) times daily.  . Cholecalciferol (VITAMIN D3) 2000 units capsule Take 4,000 Units by mouth daily. 2 caps  . citalopram (CELEXA) 20 MG tablet Take 1 tablet (20 mg total) by mouth daily.  . coal tar (NEUTROGENA T-GEL) 0.5 % shampoo Apply 1 application topically once a week. On thursdays  . dorzolamide-timolol (COSOPT) 22.3-6.8 MG/ML ophthalmic solution Place 1 drop into both eyes 2 (two) times daily. Per Dr. Arlis Porta. Wait 5 mins between admin of multiple eye drops  . folic acid (FOLVITE) 725 MCG tablet Take 400 mcg by mouth daily.   Marland Kitchen guaiFENesin (ROBITUSSIN) 100 MG/5ML liquid Take 200 mg by mouth 3 (three) times daily.   Marland Kitchen ipratropium-albuterol (DUONEB) 0.5-2.5 (3) MG/3ML SOLN Take 3 mLs by nebulization 3 (three) times daily.  Marland Kitchen lidocaine (ASPERCREME W/LIDOCAINE) 4 % cream Apply 1 application topically 3 (three) times daily as needed. Apply thin film to lumbo-sacral area for pain  . loperamide (IMODIUM) 2 MG capsule Give 4 mg by mouth prn for loose stools and 2 mg after each subsequent loose stool up to 8 doses in 24 hours  . magnesium hydroxide (MILK OF MAGNESIA) 400 MG/5ML suspension Take 30 mLs by mouth daily as needed for mild constipation. For constipation no results in 24 hours may administer bisacodyl supp  . memantine (NAMENDA) 5 MG tablet Take 5 mg by mouth 2  (two) times daily.  . mometasone (NASONEX) 50 MCG/ACT nasal spray Place 2 sprays into the nose every morning.   . potassium chloride (  MICRO-K) 10 MEQ CR capsule Take 10 mEq by mouth 2 (two) times daily.   . sennosides-docusate sodium (SENOKOT-S) 8.6-50 MG tablet Take 1 tablet by mouth at bedtime.  . Skin Protectants, Misc. (ENDIT EX) Apply liberal amount topically to areas of skin irritation as needed. Ok to leave at bedside.  . tamsulosin (FLOMAX) 0.4 MG CAPS capsule Take 0.4 mg by mouth daily.  . traMADol (ULTRAM) 50 MG tablet Take 1-2 tablets (50-100 mg total) by mouth every 4 (four) hours as needed. pain; 1- moderate, 2- severe pain  . traZODone (DESYREL) 50 MG tablet Take 50 mg by mouth at bedtime. OK to hold if pt is sleeping  . UNABLE TO FIND Regular Diet:  Regular consistency, continue thin liquids   No facility-administered encounter medications on file as of 12/06/2017.      SIGNIFICANT DIAGNOSTIC EXAMS  LABS REVIEWED: PREVIOUS :   05-05-17: wbc 6.4; hgb 11.9; hct 35.5; mcv 92.8; plt 241; glucose 96; bun 21; creat 0.99; k+ 3.6; na++ 140; ca 8.5; liver normal albumin 3.4 mag 2.3 tsh 2.221; vit B 12: 991; vit D 23.1  09-19-17: urine culture e-coli (not final) treat with doxycycline   TODAY.  11-18-17: wbc 6.5; hgb 12.5; hct 38.9; mcv 94.4; plt 364; glucose 139; bun 23; creat 1.02; k+ 3.8; na++ 138; ca 8.7; liver normal albumin 3.9; mag 2.3; vit D 32.8    Review of Systems  Unable to perform ROS: Dementia (confusion )    Physical Exam  Constitutional: He appears well-developed and well-nourished. No distress.  Neck: No thyromegaly present.  Cardiovascular: Normal rate, regular rhythm, normal heart sounds and intact distal pulses.  Pulmonary/Chest: No respiratory distress.  Increased effort Wheezing present   Abdominal: Soft. Bowel sounds are normal. He exhibits no distension. There is no tenderness.  Musculoskeletal: He exhibits no edema.  Is able to move all extremities    Lymphadenopathy:    He has no cervical adenopathy.  Neurological: He is alert.  Skin: Skin is warm and dry. He is not diaphoretic.  Psychiatric: He has a normal mood and affect.     ASSESSMENT/ PLAN:  TODAY:   1.  HAP( healthcare acquired pneumonia): he does have dysphagia  is worse: will begin doxycycline 100 mg twice daily through 12-16-17 with florastor twice daily will begin duoneb every 6 hours through 12-16-17; will complete ceftin and will continue to monitor his status.      MD is aware of resident's narcotic use and is in agreement with current plan of care. We will attempt to wean resident as apropriate   Ok Edwards NP [facility]  Contact [phone] Monday through Friday 8am- 5pm  After hours call [phone]

## 2017-12-07 DIAGNOSIS — R05 Cough: Secondary | ICD-10-CM | POA: Diagnosis not present

## 2017-12-09 ENCOUNTER — Encounter
Admission: RE | Admit: 2017-12-09 | Discharge: 2017-12-09 | Disposition: A | Discharge status: Home / Self Care | Payer: Medicare Other | Source: Ambulatory Visit | Attending: Internal Medicine | Admitting: Internal Medicine

## 2017-12-09 DIAGNOSIS — R05 Cough: Secondary | ICD-10-CM | POA: Insufficient documentation

## 2017-12-09 DIAGNOSIS — J189 Pneumonia, unspecified organism: Secondary | ICD-10-CM | POA: Diagnosis not present

## 2017-12-09 LAB — INFLUENZA PANEL BY PCR (TYPE A & B)
Influenza A By PCR: NEGATIVE
Influenza B By PCR: NEGATIVE

## 2017-12-14 DIAGNOSIS — Y95 Nosocomial condition: Secondary | ICD-10-CM

## 2017-12-14 DIAGNOSIS — J189 Pneumonia, unspecified organism: Secondary | ICD-10-CM | POA: Insufficient documentation

## 2018-01-19 ENCOUNTER — Encounter: Payer: Self-pay | Admitting: Adult Health

## 2018-01-19 ENCOUNTER — Non-Acute Institutional Stay: Payer: Medicare Other | Admitting: Adult Health

## 2018-01-19 DIAGNOSIS — J3089 Other allergic rhinitis: Secondary | ICD-10-CM

## 2018-01-19 DIAGNOSIS — N138 Other obstructive and reflux uropathy: Secondary | ICD-10-CM | POA: Diagnosis not present

## 2018-01-19 DIAGNOSIS — F324 Major depressive disorder, single episode, in partial remission: Secondary | ICD-10-CM

## 2018-01-19 DIAGNOSIS — I10 Essential (primary) hypertension: Secondary | ICD-10-CM | POA: Diagnosis not present

## 2018-01-19 DIAGNOSIS — G4733 Obstructive sleep apnea (adult) (pediatric): Secondary | ICD-10-CM | POA: Diagnosis not present

## 2018-01-19 DIAGNOSIS — K5909 Other constipation: Secondary | ICD-10-CM | POA: Diagnosis not present

## 2018-01-19 DIAGNOSIS — D649 Anemia, unspecified: Secondary | ICD-10-CM | POA: Diagnosis not present

## 2018-01-19 DIAGNOSIS — G301 Alzheimer's disease with late onset: Secondary | ICD-10-CM | POA: Diagnosis not present

## 2018-01-19 DIAGNOSIS — F0280 Dementia in other diseases classified elsewhere without behavioral disturbance: Secondary | ICD-10-CM

## 2018-01-19 DIAGNOSIS — N401 Enlarged prostate with lower urinary tract symptoms: Secondary | ICD-10-CM

## 2018-01-19 NOTE — Progress Notes (Signed)
Provider:  Ok Edwards, NP [facility] Room Number: [place] of Service:  ALF (31)   PCP: Kirk Ruths, MD Patient Care Team: Kirk Ruths, MD as PCP - General (Internal Medicine) Nyoka Cowden Phylis Bougie, NP as Nurse Practitioner (Geriatric Medicine)  Extended Emergency Contact Information Primary Emergency Contact: [facility] Address: [place] [address] [name] of [place] Phone: [phone] Work Phone: [phone] Mobile Phone: [phone] Relation: Son Secondary Emergency Contact: Nelia Shi Address: [address]          [place], [place] [address] [name] of [place] Phone: [phone] Work Phone: [phone] Mobile Phone: [phone] Relation: Daughter  Code Status: DNR Goals of Care: Advanced Directive information Advanced Directives 12/06/2017  Does Patient Have a Medical Advance Directive? Yes  Type of Advance Directive Out of facility DNR (pink MOST or yellow form)  Does patient want to make changes to medical advance directive? No - Patient declined  Copy of [place] in Chart? -  Pre-existing out of facility DNR order (yellow form or pink MOST form) Yellow form placed in chart (order not valid for inpatient use)      No Known Allergies   Chief Complaint  Patient presents with  . Annual Exam        HPI: Patient is a 84 y.o. male seen today for an annual comprehensive examination.  He has not required any hospitalizations over the past year. He was seen in July of this past year; for open angle glaucoma . He has been treated for pneumonia in Nov of this year. There are no reports of changes in appetite; no reports of anxiety or agitation; no uncontrolled pain. He will continue to followed for his chronic illnesses including: hypertension; allergic rhinitis; constipation.   Past Medical History:  Diagnosis Date  . Anemia   .  Benign prostatic hyperplasia   . BPH without obstruction/lower urinary tract symptoms 03/27/2012  . Cervical vertebral fracture (HCC)    C7 with small subdural hematoma  . Dementia ([place])   . Depression 02/05/2015   unspecified  . Dysphagia   . Facial fracture ([place])   . Falling   . Glaucoma   . Hypertension   . Intracranial injury ([place])   . Macular degeneration    early/dry  . Malignant neoplasm of prostate ([place]) 05/22/2012  . Osteoarthritis   . POAG (primary open-angle glaucoma)   . Sleep apnea 02/04/2015  . Spondylosis of cervical joint   . Subdural hematoma ([place])   . Traumatic brain injury ([place])   . Urge incontinence 03/27/2012   Past Surgical History:  Procedure Laterality Date  . APPENDECTOMY    . AQUEOUS SHUNT Right 03/17/2015   Procedure:  . CHOLECYSTECTOMY     x 2  . GALLBLADDER SURGERY    . GLAUCOMA SURGERY Right 12/2006  . HERNIA REPAIR    . LENS SURGERY Right 12/29/2010   With i-stent  . LENS SURGERY Left 02/28/2011   With i-stent  . TONSILLECTOMY    . TURP VAPORIZATION      reports that he has never smoked. He has never used smokeless tobacco. He reports current alcohol use of about 1.0 standard drinks of alcohol per week. He reports that he does not use drugs. Social History   Socioeconomic History  . Marital status: Married    Spouse name: Not on file  . Number  of children: 3  . Years of education: 12+  . Highest education level: Master's degree (e.g., MA, MS, MEng, MEd, MSW, MBA)  Occupational History    Comment: retired Community education officer  Social Needs  . Financial resource strain: Not on file  . Food insecurity:    Worry: Not on file    Inability: Not on file  . Transportation needs:    Medical: Not on file    Non-medical: Not on file  Tobacco Use  . Smoking status: Never Smoker  . Smokeless tobacco: Never Used  Substance and Sexual Activity  . Alcohol use: Yes    Alcohol/week: 1.0 standard drinks    Types: 1 Cans of beer per week    Comment:  very little  . Drug use: No  . Sexual activity: Not on file  Lifestyle  . Physical activity:    Days per week: Not on file    Minutes per session: Not on file  . Stress: Not on file  Relationships  . Social connections:    Talks on phone: Not on file    Gets together: Not on file    Attends religious service: Not on file    Active member of club or organization: Not on file    Attends meetings of clubs or organizations: Not on file    Relationship status: Not on file  . Intimate partner violence:    Fear of current or ex partner: Not on file    Emotionally abused: Not on file    Physically abused: Not on file    Forced sexual activity: Not on file  Other Topics Concern  . Not on file  Social History Narrative   Admitted to [facility] 10/18/2011   Married   Drinks 1 beer weekly    Never Smoker   DNR   Family History  Problem Relation Age of Onset  . Hypertension Father   . Anesthesia problems Neg Hx   . Cataracts Neg Hx   . Glaucoma Neg Hx   . Macular degeneration Neg Hx   . GU problems Neg Hx   . Kidney disease Neg Hx   . Prostate cancer Neg Hx     Vitals:   01/19/18 1415  BP: (!) 141/62  Pulse: 78  Resp: 20  Temp: 97.9 F (36.6 C)  SpO2: 98%  Weight: 167 lb 3.2 oz (75.8 kg)  Height: 5\' 5"  (1.651 m)   Body mass index is 27.82 kg/m.  Allergies as of 01/19/2018   No Known Allergies     Medication List       Accurate as of January 19, 2018  2:25 PM. Always use your most recent med list.        amLODipine 5 MG tablet Commonly known as:  NORVASC Take 10 mg by mouth daily.   ASPERCREME W/LIDOCAINE 4 % cream Generic drug:  lidocaine Apply 1 application topically 3 (three) times daily as needed. Apply thin film to lumbo-sacral area for pain   aspirin EC 81 MG tablet Take 81 mg by mouth daily.   ASTEPRO 0.15 % Soln Generic drug:  Azelastine HCl Place 2 sprays into both nostrils at bedtime.   brimonidine 0.2 % ophthalmic  solution Commonly known as:  ALPHAGAN Place 1 drop into both eyes 2 (two) times daily. Per Dr. Laveda Norman, MD   carvedilol 6.25 MG tablet Commonly known as:  COREG Take 6.25 mg by mouth every 12 (twelve) hours.   chlorhexidine 0.12 % solution Commonly  known as:  PERIDEX Use as directed 10 mLs in the mouth or throat 2 (two) times daily.   citalopram 20 MG tablet Commonly known as:  CELEXA Take 1 tablet (20 mg total) by mouth daily.   coal tar 0.5 % shampoo Commonly known as:  NEUTROGENA T-GEL Apply 1 application topically once a week. On thursdays   dorzolamide-timolol 22.3-6.8 MG/ML ophthalmic solution Commonly known as:  COSOPT Place 1 drop into both eyes 2 (two) times daily. Per Dr. Arlis Porta. Wait 5 mins between admin of multiple eye drops   ENDIT EX Apply liberal amount topically to areas of skin irritation as needed. Ok to leave at bedside.   folic acid 683 MCG tablet Commonly known as:  FOLVITE Take 400 mcg by mouth daily.   loperamide 2 MG capsule Commonly known as:  IMODIUM Give 4 mg by mouth prn for loose stools and 2 mg after each subsequent loose stool up to 8 doses in 24 hours   magnesium hydroxide 400 MG/5ML suspension Commonly known as:  MILK OF MAGNESIA Take 30 mLs by mouth daily as needed for mild constipation. For constipation no results in 24 hours may administer bisacodyl supp   MAPAP ARTHRITIS PAIN 650 MG CR tablet Generic drug:  acetaminophen Take 1,300 mg by mouth 2 (two) times daily.   memantine 5 MG tablet Commonly known as:  NAMENDA Take 5 mg by mouth 2 (two) times daily.   mometasone 50 MCG/ACT nasal spray Commonly known as:  NASONEX Place 2 sprays into the nose every morning.   potassium chloride 10 MEQ CR capsule Commonly known as:  MICRO-K Take 10 mEq by mouth 2 (two) times daily.   sennosides-docusate sodium 8.6-50 MG tablet Commonly known as:  SENOKOT-S Take 1 tablet by mouth at bedtime.   tamsulosin 0.4 MG Caps capsule Commonly  known as:  FLOMAX Take 0.4 mg by mouth daily.   traMADol 50 MG tablet Commonly known as:  ULTRAM Take 1-2 tablets (50-100 mg total) by mouth every 4 (four) hours as needed. pain; 1- moderate, 2- severe pain   traZODone 50 MG tablet Commonly known as:  DESYREL Take 50 mg by mouth at bedtime. OK to hold if pt is sleeping   UNABLE TO FIND Regular Diet:  Regular consistency, continue thin liquids   Vitamin D3 50 MCG (2000 UT) capsule Take 4,000 Units by mouth daily. 2 caps        SIGNIFICANT DIAGNOSTIC EXAMS  LABS REVIEWED: PREVIOUS :   05-05-17: wbc 6.4; hgb 11.9; hct 35.5; mcv 92.8; plt 241; glucose 96; bun 21; creat 0.99; k+ 3.6; na++ 140; ca 8.5; liver normal albumin 3.4 mag 2.3 tsh 2.221; vit B 12: 991; vit D 23.1  09-19-17: urine culture e-coli (not final) treat with doxycycline  11-18-17: wbc 6.5; hgb 12.5; hct 38.9; mcv 94.4; plt 364; glucose 139; bun 23; creat 1.02; k+ 3.8; na++ 138; ca 8.7; liver normal albumin 3.9; mag 2.3; vit D 32.8  NO NEW LABS.    Review of Systems  Unable to perform ROS: Dementia (confusion)   Physical Exam Constitutional:      General: He is not in acute distress.    Appearance: Normal appearance. He is well-developed. He is not diaphoretic.  HENT:     Head: Normocephalic.     Mouth/Throat:     Mouth: Mucous membranes are moist.     Pharynx: Oropharynx is clear.  Eyes:     Conjunctiva/sclera: Conjunctivae normal.  Neck:  Thyroid: No thyromegaly.  Cardiovascular:     Rate and Rhythm: Normal rate and regular rhythm.     Pulses: Normal pulses.     Heart sounds: Normal heart sounds.  Pulmonary:     Effort: Pulmonary effort is normal. No respiratory distress.     Breath sounds: Normal breath sounds.  Abdominal:     General: Bowel sounds are normal. There is no distension.     Palpations: Abdomen is soft.     Tenderness: There is no abdominal tenderness.  Musculoskeletal:     Right lower leg: No edema.     Left lower leg: No edema.      Comments: Is able to move all extremities   Lymphadenopathy:     Cervical: No cervical adenopathy.  Skin:    General: Skin is warm and dry.  Neurological:     Mental Status: He is alert. Mental status is at baseline.  Psychiatric:        Mood and Affect: Mood normal.      ASSESSMENT/ PLAN:  TODAY:   1. Essential (primary) hypertension: is stable 141/62: will continue norvasc 5 mg daily coreg 6.25 mg twice daily asa 81 mg daily   2. OSA is stable is not using cpap; will monitor  3.  Allergic rhinitis: is stable will continue astepro 2 pufs every night and nasonex daily   4. Late onset alzheimer's disease without behavioral disturbance: is without changes: weight is 167 (previous 171) pounds; will continue namenda 5 mg twice daily and will continue to monitor his status.   5. Spondylosis without myelopathy or radiculopathy; cervical region/with chronic back pain : is stable will continue lidocaine 4% to back three times daily as needed has ultram 50 or 100 mg every 4 hours as needed   6.  Benign prostatic hyperplasia with urinary obstruction: is stable will continue flomax 0.4 mg daily   7.  Hypokalemia: stable k+ 3.8; will continue k+ 10 meq twice daily   8. Bilateral open-angle glaucoma: is stable will continue alphagan to both eyes twice daily cosopt to both eyes twice daily   9. Major depression single episode partial remission: is stable will continue celexa 20 mg daily  trazodone  50 mg nightly to help with sleep   10. Chronic constipation: is stable will continue senna s nightly  His health maintenance is up to date      MD is aware of resident's narcotic use and is in agreement with current plan of care. We will wean dosage as appropriate for resident   Ok Edwards NP [facility]  Contact [phone] Monday through Friday 8am- 5pm  After hours call [phone]

## 2018-01-24 ENCOUNTER — Non-Acute Institutional Stay (SKILLED_NURSING_FACILITY): Payer: Medicare Other | Admitting: Adult Health

## 2018-01-24 ENCOUNTER — Encounter: Payer: Self-pay | Admitting: Adult Health

## 2018-01-24 DIAGNOSIS — J189 Pneumonia, unspecified organism: Secondary | ICD-10-CM | POA: Diagnosis not present

## 2018-01-24 DIAGNOSIS — Y95 Nosocomial condition: Principal | ICD-10-CM

## 2018-01-24 NOTE — Progress Notes (Signed)
Location:   [facility] Room Number: [place] of Service:  ALF (13)   CODE STATUS: DNR  No Known Allergies  Chief Complaint  Patient presents with  . Acute Visit    Cough, Congestion    HPI:  For the past several days he has developed a cough with Tony [name] sputum production. He does have a history of dysphagia; he has been noted to have coughing with meals; which is not new to him. He is on thin liquids. There are no reports of fevers present.   Past Medical History:  Diagnosis Date  . Anemia   . Benign prostatic hyperplasia   . BPH without obstruction/lower urinary tract symptoms 03/27/2012  . Cervical vertebral fracture (HCC)    C7 with small subdural hematoma  . Dementia ([place])   . Depression 02/05/2015   unspecified  . Dysphagia   . Facial fracture ([place])   . Falling   . Glaucoma   . Hypertension   . Intracranial injury ([place])   . Macular degeneration    early/dry  . Malignant neoplasm of prostate ([place]) 05/22/2012  . Osteoarthritis   . POAG (primary open-angle glaucoma)   . Sleep apnea 02/04/2015  . Spondylosis of cervical joint   . Subdural hematoma ([place])   . Traumatic brain injury ([place])   . Urge incontinence 03/27/2012    Past Surgical History:  Procedure Laterality Date  . APPENDECTOMY    . AQUEOUS SHUNT Right 03/17/2015   Procedure:  . CHOLECYSTECTOMY     x 2  . GALLBLADDER SURGERY    . GLAUCOMA SURGERY Right 12/2006  . HERNIA REPAIR    . LENS SURGERY Right 12/29/2010   With i-stent  . LENS SURGERY Left 02/28/2011   With i-stent  . TONSILLECTOMY    . TURP VAPORIZATION      Social History   Socioeconomic History  . Marital status: Married    Spouse name: Not on file  . Number of children: 3  . Years of education: 12+  . Highest education level: Master's degree (e.g., MA, MS, MEng, MEd, MSW, MBA)  Occupational History    Comment: retired Community education officer  Social Needs  . Financial resource strain: Not on file  .  Food insecurity:    Worry: Not on file    Inability: Not on file  . Transportation needs:    Medical: Not on file    Non-medical: Not on file  Tobacco Use  . Smoking status: Never Smoker  . Smokeless tobacco: Never Used  Substance and Sexual Activity  . Alcohol use: Yes    Alcohol/week: 1.0 standard drinks    Types: 1 Cans of beer per week    Comment: very little  . Drug use: No  . Sexual activity: Not on file  Lifestyle  . Physical activity:    Days per week: Not on file    Minutes per session: Not on file  . Stress: Not on file  Relationships  . Social connections:    Talks on phone: Not on file    Gets together: Not on file    Attends religious service: Not on file    Active member of club or organization: Not on file    Attends meetings of clubs or organizations: Not on file    Relationship status: Not on file  . Intimate partner violence:    Fear of current or ex partner: Not on file    Emotionally abused: Not on  file    Physically abused: Not on file    Forced sexual activity: Not on file  Other Topics Concern  . Not on file  Social History Narrative   Admitted to [facility] 10/18/2011   Married   Drinks 1 beer weekly    Never Smoker   DNR   Family History  Problem Relation Age of Onset  . Hypertension Father   . Anesthesia problems Neg Hx   . Cataracts Neg Hx   . Glaucoma Neg Hx   . Macular degeneration Neg Hx   . GU problems Neg Hx   . Kidney disease Neg Hx   . Prostate cancer Neg Hx       VITAL SIGNS BP (!) 152/74   Pulse 79   Temp 97.9 F (36.6 C)   Resp 20   Ht 5\' 5"  (1.651 m)   Wt 167 lb 3.2 oz (75.8 kg)   SpO2 98%   BMI 27.82 kg/m   Outpatient Encounter Medications as of 01/24/2018  Medication Sig  . acetaminophen (MAPAP ARTHRITIS PAIN) 650 MG CR tablet Take 1,300 mg by mouth 2 (two) times daily.  Marland Kitchen amLODipine (NORVASC) 5 MG tablet Take 10 mg by mouth daily.   Marland Kitchen aspirin EC 81 MG tablet Take 81 mg by mouth daily.   .  Azelastine HCl (ASTEPRO) 0.15 % SOLN Place 2 sprays into both nostrils at bedtime.   . brimonidine (ALPHAGAN) 0.2 % ophthalmic solution Place 1 drop into both eyes 2 (two) times daily. Per Dr. Laveda Norman, MD  . carvedilol (COREG) 6.25 MG tablet Take 6.25 mg by mouth every 12 (twelve) hours.   . chlorhexidine (PERIDEX) 0.12 % solution Use as directed 10 mLs in the mouth or throat 2 (two) times daily.  . Cholecalciferol (VITAMIN D3) 2000 units capsule Take 4,000 Units by mouth daily. 2 caps  . citalopram (CELEXA) 20 MG tablet Take 1 tablet (20 mg total) by mouth daily.  . coal tar (NEUTROGENA T-GEL) 0.5 % shampoo Apply 1 application topically once a week. On thursdays  . dorzolamide-timolol (COSOPT) 22.3-6.8 MG/ML ophthalmic solution Place 1 drop into both eyes 2 (two) times daily. Per Dr. Arlis Porta. Wait 5 mins between admin of multiple eye drops  . folic acid (FOLVITE) 097 MCG tablet Take 400 mcg by mouth daily.   Marland Kitchen lidocaine (ASPERCREME W/LIDOCAINE) 4 % cream Apply 1 application topically 3 (three) times daily as needed. Apply thin film to lumbo-sacral area for pain  . loperamide (IMODIUM) 2 MG capsule Give 4 mg by mouth prn for loose stools and 2 mg after each subsequent loose stool up to 8 doses in 24 hours  . magnesium hydroxide (MILK OF MAGNESIA) 400 MG/5ML suspension Take 30 mLs by mouth daily as needed for mild constipation. For constipation no results in 24 hours may administer bisacodyl supp  . memantine (NAMENDA) 5 MG tablet Take 5 mg by mouth 2 (two) times daily.  . mometasone (NASONEX) 50 MCG/ACT nasal spray Place 2 sprays into the nose every morning.   . potassium chloride (MICRO-K) 10 MEQ CR capsule Take 10 mEq by mouth 2 (two) times daily.   . sennosides-docusate sodium (SENOKOT-S) 8.6-50 MG tablet Take 1 tablet by mouth at bedtime.  . Skin Protectants, Misc. (ENDIT EX) Apply liberal amount topically to areas of skin irritation as needed. Ok to leave at bedside.  . tamsulosin (FLOMAX) 0.4  MG CAPS capsule Take 0.4 mg by mouth daily.  . traMADol (ULTRAM) 50 MG tablet  Take 1-2 tablets (50-100 mg total) by mouth every 4 (four) hours as needed. pain; 1- moderate, 2- severe pain  . traZODone (DESYREL) 50 MG tablet Take 50 mg by mouth at bedtime. OK to hold if pt is sleeping  . UNABLE TO FIND Regular Diet:  Regular consistency, continue thin liquids   No facility-administered encounter medications on file as of 01/24/2018.      SIGNIFICANT DIAGNOSTIC EXAMS  LABS REVIEWED: PREVIOUS :   05-05-17: wbc 6.4; hgb 11.9; hct 35.5; mcv 92.8; plt 241; glucose 96; bun 21; creat 0.99; k+ 3.6; na++ 140; ca 8.5; liver normal albumin 3.4 mag 2.3 tsh 2.221; vit B 12: 991; vit D 23.1  09-19-17: urine culture e-coli (not final) treat with doxycycline  11-18-17: wbc 6.5; hgb 12.5; hct 38.9; mcv 94.4; plt 364; glucose 139; bun 23; creat 1.02; k+ 3.8; na++ 138; ca 8.7; liver normal albumin 3.9; mag 2.3; vit D 32.8  NO NEW LABS.    Review of Systems  Unable to perform ROS: Dementia (confusion)    Physical Exam Constitutional:      Appearance: Normal appearance. He is well-developed. He is ill-appearing. He is not diaphoretic.  Neck:     Thyroid: No thyromegaly.  Cardiovascular:     Rate and Rhythm: Normal rate and regular rhythm.     Pulses: Normal pulses.     Heart sounds: Normal heart sounds.  Pulmonary:     Effort: Pulmonary effort is normal. No respiratory distress.     Breath sounds: Wheezing, rhonchi and rales present.  Abdominal:     General: Bowel sounds are normal. There is no distension.     Palpations: Abdomen is soft.     Tenderness: There is no abdominal tenderness.  Musculoskeletal:     Comments: Is able to move all extremities   Lymphadenopathy:     Cervical: No cervical adenopathy.  Skin:    General: Skin is warm and dry.  Neurological:     General: No focal deficit present.     Mental Status: He is alert.       ASSESSMENT/ PLAN:  TODAY:   1.  HAP:  hospital-acquired pneumonia: is worse: will begin augmentin 875 mg twice daily through 02-03-18 with a probiotic       MD is aware of resident's narcotic use and is in agreement with current plan of care. We will attempt to wean resident as apropriate   Ok Edwards NP [facility]  Contact [phone] Monday through Friday 8am- 5pm  After hours call [phone]

## 2018-02-20 ENCOUNTER — Encounter
Admission: RE | Admit: 2018-02-20 | Discharge: 2018-02-20 | Disposition: A | Discharge status: Home / Self Care | Payer: Medicare Other | Source: Ambulatory Visit | Attending: Internal Medicine | Admitting: Internal Medicine

## 2018-02-20 DIAGNOSIS — R05 Cough: Secondary | ICD-10-CM | POA: Insufficient documentation

## 2018-03-03 DIAGNOSIS — G301 Alzheimer's disease with late onset: Secondary | ICD-10-CM | POA: Diagnosis not present

## 2018-03-03 DIAGNOSIS — F0280 Dementia in other diseases classified elsewhere without behavioral disturbance: Secondary | ICD-10-CM | POA: Diagnosis not present

## 2018-03-03 DIAGNOSIS — I10 Essential (primary) hypertension: Secondary | ICD-10-CM | POA: Diagnosis not present

## 2018-03-03 DIAGNOSIS — I5032 Chronic diastolic (congestive) heart failure: Secondary | ICD-10-CM | POA: Diagnosis not present

## 2018-03-03 DIAGNOSIS — F325 Major depressive disorder, single episode, in full remission: Secondary | ICD-10-CM | POA: Diagnosis not present

## 2018-03-03 DIAGNOSIS — Z593 Problems related to living in residential institution: Secondary | ICD-10-CM | POA: Diagnosis not present

## 2018-03-11 ENCOUNTER — Encounter
Admission: RE | Admit: 2018-03-11 | Discharge: 2018-03-11 | Disposition: A | Discharge status: Home / Self Care | Payer: Medicare Other | Source: Ambulatory Visit | Attending: Internal Medicine | Admitting: Internal Medicine

## 2018-03-11 DIAGNOSIS — R05 Cough: Secondary | ICD-10-CM | POA: Insufficient documentation

## 2018-03-21 DIAGNOSIS — H401133 Primary open-angle glaucoma, bilateral, severe stage: Secondary | ICD-10-CM | POA: Diagnosis not present

## 2018-03-22 ENCOUNTER — Encounter: Payer: Self-pay | Admitting: Adult Health

## 2018-03-22 ENCOUNTER — Non-Acute Institutional Stay: Payer: Medicare Other | Admitting: Adult Health

## 2018-03-22 DIAGNOSIS — F0280 Dementia in other diseases classified elsewhere without behavioral disturbance: Secondary | ICD-10-CM

## 2018-03-22 DIAGNOSIS — J3089 Other allergic rhinitis: Secondary | ICD-10-CM

## 2018-03-22 DIAGNOSIS — G4733 Obstructive sleep apnea (adult) (pediatric): Secondary | ICD-10-CM | POA: Diagnosis not present

## 2018-03-22 DIAGNOSIS — G301 Alzheimer's disease with late onset: Secondary | ICD-10-CM | POA: Diagnosis not present

## 2018-03-22 DIAGNOSIS — I10 Essential (primary) hypertension: Secondary | ICD-10-CM | POA: Diagnosis not present

## 2018-03-22 NOTE — Progress Notes (Signed)
[facility] Room Number: [place] of Service:  ALF (13)   CODE STATUS: DNR  No Known Allergies  Chief Complaint  Patient presents with  . Medical Management of Chronic Issues    Essential (primary) hypertension; last onset alzheimers disease without behavioral disturbance; obstructive sleep apnea; non-seasonal allergic rhinitis due to other allergic trigger.     HPI:  He is a 86 year old long term resident of this facility being seen for the management of his chronic illnesses; hypertension; alzheimers; osa; allergic rhinitis. He has been treated for pneumonia in the past couple of months. There are no reports of changes in appetite; no reports of agitation; no reports of anxiety or insomnia.   Past Medical History:  Diagnosis Date  . Anemia   . Benign prostatic hyperplasia   . BPH without obstruction/lower urinary tract symptoms 03/27/2012  . Cervical vertebral fracture (HCC)    C7 with small subdural hematoma  . Dementia ([place])   . Depression 02/05/2015   unspecified  . Dysphagia   . Facial fracture ([place])   . Falling   . Glaucoma   . Hypertension   . Intracranial injury ([place])   . Macular degeneration    early/dry  . Malignant neoplasm of prostate ([place]) 05/22/2012  . Osteoarthritis   . POAG (primary open-angle glaucoma)   . Sleep apnea 02/04/2015  . Spondylosis of cervical joint   . Subdural hematoma ([place])   . Traumatic brain injury ([place])   . Urge incontinence 03/27/2012    Past Surgical History:  Procedure Laterality Date  . APPENDECTOMY    . AQUEOUS SHUNT Right 03/17/2015   Procedure:  . CHOLECYSTECTOMY     x 2  . GALLBLADDER SURGERY    . GLAUCOMA SURGERY Right 12/2006  . HERNIA REPAIR    . LENS SURGERY Right 12/29/2010   With i-stent  . LENS SURGERY Left 02/28/2011   With i-stent  . TONSILLECTOMY    . TURP VAPORIZATION      Social History   Socioeconomic History  . Marital status: Married    Spouse  name: Not on file  . Number of children: 3  . Years of education: 12+  . Highest education level: Master's degree (e.g., MA, MS, MEng, MEd, MSW, MBA)  Occupational History    Comment: retired Community education officer  Social Needs  . Financial resource strain: Not on file  . Food insecurity:    Worry: Not on file    Inability: Not on file  . Transportation needs:    Medical: Not on file    Non-medical: Not on file  Tobacco Use  . Smoking status: Never Smoker  . Smokeless tobacco: Never Used  Substance and Sexual Activity  . Alcohol use: Yes    Alcohol/week: 1.0 standard drinks    Types: 1 Cans of beer per week    Comment: very little  . Drug use: No  . Sexual activity: Not on file  Lifestyle  . Physical activity:    Days per week: Not on file    Minutes per session: Not on file  . Stress: Not on file  Relationships  . Social connections:    Talks on phone: Not on file    Gets together: Not on file    Attends religious service: Not on file    Active member of club or organization: Not on file    Attends meetings of clubs or organizations: Not on file  Relationship status: Not on file  . Intimate partner violence:    Fear of current or ex partner: Not on file    Emotionally abused: Not on file    Physically abused: Not on file    Forced sexual activity: Not on file  Other Topics Concern  . Not on file  Social History Narrative   Admitted to [facility] 10/18/2011   Married   Drinks 1 beer weekly    Never Smoker   DNR   Family History  Problem Relation Age of Onset  . Hypertension Father   . Anesthesia problems Neg Hx   . Cataracts Neg Hx   . Glaucoma Neg Hx   . Macular degeneration Neg Hx   . GU problems Neg Hx   . Kidney disease Neg Hx   . Prostate cancer Neg Hx       VITAL SIGNS BP (!) 167/63   Pulse 70   Temp (!) 96.6 F (35.9 C)   Resp 16   Ht 5\' 5"  (1.651 m)   Wt 166 lb 12.8 oz (75.7 kg)   SpO2 100%   BMI 27.76 kg/m   Outpatient Encounter  Medications as of 03/22/2018  Medication Sig  . acetaminophen (MAPAP ARTHRITIS PAIN) 650 MG CR tablet Take 1,300 mg by mouth 2 (two) times daily.  Marland Kitchen amLODipine (NORVASC) 5 MG tablet Take 10 mg by mouth daily.   Marland Kitchen aspirin EC 81 MG tablet Take 81 mg by mouth daily.   . Azelastine HCl (ASTEPRO) 0.15 % SOLN Place 2 sprays into both nostrils at bedtime.   . brimonidine (ALPHAGAN) 0.2 % ophthalmic solution Place 1 drop into both eyes 2 (two) times daily. Per Dr. Laveda Norman, MD  . carvedilol (COREG) 6.25 MG tablet Take 6.25 mg by mouth every 12 (twelve) hours.   . chlorhexidine (PERIDEX) 0.12 % solution Use as directed 10 mLs in the mouth or throat 2 (two) times daily.  . Cholecalciferol (VITAMIN D3) 2000 units capsule Take 4,000 Units by mouth daily. 2 caps  . citalopram (CELEXA) 20 MG tablet Take 1 tablet (20 mg total) by mouth daily.  . coal tar (NEUTROGENA T-GEL) 0.5 % shampoo Apply 1 application topically once a week. On thursdays  . dorzolamide-timolol (COSOPT) 22.3-6.8 MG/ML ophthalmic solution Place 1 drop into both eyes 2 (two) times daily. Per Dr. Arlis Porta. Wait 5 mins between admin of multiple eye drops  . folic acid (FOLVITE) 329 MCG tablet Take 400 mcg by mouth daily.   Marland Kitchen lidocaine (ASPERCREME W/LIDOCAINE) 4 % cream Apply 1 application topically 3 (three) times daily as needed. Apply thin film to lumbo-sacral area for pain  . loperamide (IMODIUM) 2 MG capsule Give 4 mg by mouth prn for loose stools and 2 mg after each subsequent loose stool up to 8 doses in 24 hours  . magnesium hydroxide (MILK OF MAGNESIA) 400 MG/5ML suspension Take 30 mLs by mouth daily as needed for mild constipation. For constipation no results in 24 hours may administer bisacodyl supp  . memantine (NAMENDA) 5 MG tablet Take 5 mg by mouth 2 (two) times daily.  . mometasone (NASONEX) 50 MCG/ACT nasal spray Place 2 sprays into the nose every morning.   . potassium chloride (MICRO-K) 10 MEQ CR capsule Take 10 mEq by mouth 2  (two) times daily.   . sennosides-docusate sodium (SENOKOT-S) 8.6-50 MG tablet Take 1 tablet by mouth at bedtime.  . Skin Protectants, Misc. (ENDIT EX) Apply liberal amount topically to areas of  skin irritation as needed. Ok to leave at bedside.  . tamsulosin (FLOMAX) 0.4 MG CAPS capsule Take 0.4 mg by mouth daily.  . traZODone (DESYREL) 50 MG tablet Take 50 mg by mouth at bedtime. OK to hold if pt is sleeping  . UNABLE TO FIND Regular Diet:  Regular consistency, continue thin liquids  . White Petrolatum-Mineral Oil (REFRESH LACRI-LUBE) OINT Apply to both eyes nightly for dryness at bedtime  . [DISCONTINUED] traMADol (ULTRAM) 50 MG tablet Take 1-2 tablets (50-100 mg total) by mouth every 4 (four) hours as needed. pain; 1- moderate, 2- severe pain (Patient not taking: Reported on 03/22/2018)   No facility-administered encounter medications on file as of 03/22/2018.      SIGNIFICANT DIAGNOSTIC EXAMS  LABS REVIEWED: PREVIOUS :   05-05-17: wbc 6.4; hgb 11.9; hct 35.5; mcv 92.8; plt 241; glucose 96; bun 21; creat 0.99; k+ 3.6; na++ 140; ca 8.5; liver normal albumin 3.4 mag 2.3 tsh 2.221; vit B 12: 991; vit D 23.1  09-19-17: urine culture e-coli (not final) treat with doxycycline  11-18-17: wbc 6.5; hgb 12.5; hct 38.9; mcv 94.4; plt 364; glucose 139; bun 23; creat 1.02; k+ 3.8; na++ 138; ca 8.7; liver normal albumin 3.9; mag 2.3; vit D 32.8  NO NEW LABS.    Review of Systems  Unable to perform ROS: Dementia (confusion)    Physical Exam Constitutional:      General: He is not in acute distress.    Appearance: Normal appearance. He is well-developed. He is ill-appearing. He is not diaphoretic.  Neck:     Musculoskeletal: Neck supple.     Thyroid: No thyromegaly.  Cardiovascular:     Rate and Rhythm: Normal rate and regular rhythm.     Pulses: Normal pulses.     Heart sounds: Normal heart sounds.  Pulmonary:     Effort: Pulmonary effort is normal. No respiratory distress.     Breath  sounds: Wheezing present.  Abdominal:     General: Bowel sounds are normal. There is no distension.     Palpations: Abdomen is soft.     Tenderness: There is no abdominal tenderness.  Musculoskeletal:     Right lower leg: Edema present.     Left lower leg: Edema present.     Comments: 1+ bilateral lower extremity edema Is able to move all extremities   Lymphadenopathy:     Cervical: No cervical adenopathy.  Skin:    General: Skin is warm and dry.  Neurological:     Mental Status: He is alert. Mental status is at baseline.  Psychiatric:        Mood and Affect: Mood normal.       ASSESSMENT/ PLAN:  TODAY:   1. Essential (primary) hypertension: is stable 167/63: will continue norvasc 5 mg daily coreg 6.25 mg twice daily asa 81 mg daily   2. OSA is stable is not using cpap; will monitor  3.  Allergic rhinitis: is stable will continue astepro 2 pufs every night and nasonex daily   4. Late onset alzheimer's disease without behavioral disturbance: is without changes: weight is 166 (previous 167) pounds; will continue namenda 5 mg twice daily and will continue to monitor his status.   PREVIOUS  5. Spondylosis without myelopathy or radiculopathy; cervical region/with chronic back pain : is stable will continue lidocaine 4% to back three times daily as needed   6.  Benign prostatic hyperplasia with urinary obstruction: is stable will continue flomax 0.4 mg daily  7.  Hypokalemia: stable k+ 3.8; will continue k+ 10 meq twice daily   8. Bilateral open-angle glaucoma: is stable will continue alphagan to both eyes twice daily cosopt to both eyes twice daily   9. Major depression single episode partial remission: is stable will continue celexa 20 mg daily  trazodone  50 mg nightly to help with sleep   10. Chronic constipation: is stable will continue senna s nightly   MD is aware of resident's narcotic use and is in agreement with current plan of care. We will attempt to wean  resident as apropriate   Ok Edwards NP [facility]  Contact [phone] Monday through Friday 8am- 5pm  After hours call [phone]

## 2018-03-25 NOTE — Addendum Note (Signed)
Addended by: Gerlene Fee on: 03/25/2018 08:28 PM   Modules accepted: Level of Service

## 2018-04-04 DIAGNOSIS — F0280 Dementia in other diseases classified elsewhere without behavioral disturbance: Secondary | ICD-10-CM | POA: Diagnosis not present

## 2018-04-04 DIAGNOSIS — R1312 Dysphagia, oropharyngeal phase: Secondary | ICD-10-CM | POA: Diagnosis not present

## 2018-04-04 DIAGNOSIS — G301 Alzheimer's disease with late onset: Secondary | ICD-10-CM | POA: Diagnosis not present

## 2018-04-04 DIAGNOSIS — M47812 Spondylosis without myelopathy or radiculopathy, cervical region: Secondary | ICD-10-CM | POA: Diagnosis not present

## 2018-04-06 DIAGNOSIS — F0280 Dementia in other diseases classified elsewhere without behavioral disturbance: Secondary | ICD-10-CM | POA: Diagnosis not present

## 2018-04-06 DIAGNOSIS — E538 Deficiency of other specified B group vitamins: Secondary | ICD-10-CM | POA: Diagnosis not present

## 2018-04-06 DIAGNOSIS — G301 Alzheimer's disease with late onset: Secondary | ICD-10-CM | POA: Diagnosis not present

## 2018-04-06 DIAGNOSIS — R5383 Other fatigue: Secondary | ICD-10-CM | POA: Diagnosis not present

## 2018-04-10 DIAGNOSIS — G301 Alzheimer's disease with late onset: Secondary | ICD-10-CM | POA: Diagnosis not present

## 2018-04-10 DIAGNOSIS — M47812 Spondylosis without myelopathy or radiculopathy, cervical region: Secondary | ICD-10-CM | POA: Diagnosis not present

## 2018-04-10 DIAGNOSIS — F0280 Dementia in other diseases classified elsewhere without behavioral disturbance: Secondary | ICD-10-CM | POA: Diagnosis not present

## 2018-04-10 DIAGNOSIS — R1312 Dysphagia, oropharyngeal phase: Secondary | ICD-10-CM | POA: Diagnosis not present

## 2018-04-11 DIAGNOSIS — F0280 Dementia in other diseases classified elsewhere without behavioral disturbance: Secondary | ICD-10-CM | POA: Diagnosis not present

## 2018-04-11 DIAGNOSIS — G301 Alzheimer's disease with late onset: Secondary | ICD-10-CM | POA: Diagnosis not present

## 2018-04-11 DIAGNOSIS — M47812 Spondylosis without myelopathy or radiculopathy, cervical region: Secondary | ICD-10-CM | POA: Diagnosis not present

## 2018-04-11 DIAGNOSIS — R1312 Dysphagia, oropharyngeal phase: Secondary | ICD-10-CM | POA: Diagnosis not present

## 2018-04-12 ENCOUNTER — Encounter
Admission: RE | Admit: 2018-04-12 | Discharge: 2018-04-12 | Disposition: A | Discharge status: Home / Self Care | Payer: Medicare Other | Source: Ambulatory Visit | Attending: Internal Medicine | Admitting: Internal Medicine

## 2018-04-12 DIAGNOSIS — R1312 Dysphagia, oropharyngeal phase: Secondary | ICD-10-CM | POA: Diagnosis not present

## 2018-04-12 DIAGNOSIS — G301 Alzheimer's disease with late onset: Secondary | ICD-10-CM | POA: Diagnosis not present

## 2018-04-12 DIAGNOSIS — F0280 Dementia in other diseases classified elsewhere without behavioral disturbance: Secondary | ICD-10-CM | POA: Diagnosis not present

## 2018-04-12 DIAGNOSIS — M47812 Spondylosis without myelopathy or radiculopathy, cervical region: Secondary | ICD-10-CM | POA: Diagnosis not present

## 2018-04-12 DIAGNOSIS — R05 Cough: Secondary | ICD-10-CM | POA: Insufficient documentation

## 2018-04-19 DIAGNOSIS — F0280 Dementia in other diseases classified elsewhere without behavioral disturbance: Secondary | ICD-10-CM | POA: Diagnosis not present

## 2018-04-19 DIAGNOSIS — G301 Alzheimer's disease with late onset: Secondary | ICD-10-CM | POA: Diagnosis not present

## 2018-04-19 DIAGNOSIS — M47812 Spondylosis without myelopathy or radiculopathy, cervical region: Secondary | ICD-10-CM | POA: Diagnosis not present

## 2018-04-19 DIAGNOSIS — R1312 Dysphagia, oropharyngeal phase: Secondary | ICD-10-CM | POA: Diagnosis not present

## 2018-04-20 DIAGNOSIS — R1312 Dysphagia, oropharyngeal phase: Secondary | ICD-10-CM | POA: Diagnosis not present

## 2018-04-20 DIAGNOSIS — F0280 Dementia in other diseases classified elsewhere without behavioral disturbance: Secondary | ICD-10-CM | POA: Diagnosis not present

## 2018-04-20 DIAGNOSIS — G301 Alzheimer's disease with late onset: Secondary | ICD-10-CM | POA: Diagnosis not present

## 2018-04-20 DIAGNOSIS — M47812 Spondylosis without myelopathy or radiculopathy, cervical region: Secondary | ICD-10-CM | POA: Diagnosis not present

## 2018-04-24 ENCOUNTER — Non-Acute Institutional Stay: Payer: Medicare Other | Admitting: Adult Health

## 2018-04-24 ENCOUNTER — Encounter: Payer: Self-pay | Admitting: Adult Health

## 2018-04-24 DIAGNOSIS — R509 Fever, unspecified: Secondary | ICD-10-CM | POA: Diagnosis not present

## 2018-04-24 DIAGNOSIS — M47812 Spondylosis without myelopathy or radiculopathy, cervical region: Secondary | ICD-10-CM | POA: Diagnosis not present

## 2018-04-24 DIAGNOSIS — R05 Cough: Secondary | ICD-10-CM

## 2018-04-24 DIAGNOSIS — R1312 Dysphagia, oropharyngeal phase: Secondary | ICD-10-CM | POA: Diagnosis not present

## 2018-04-24 DIAGNOSIS — F0280 Dementia in other diseases classified elsewhere without behavioral disturbance: Secondary | ICD-10-CM | POA: Diagnosis not present

## 2018-04-24 DIAGNOSIS — R059 Cough, unspecified: Secondary | ICD-10-CM

## 2018-04-24 DIAGNOSIS — G301 Alzheimer's disease with late onset: Secondary | ICD-10-CM | POA: Diagnosis not present

## 2018-04-24 NOTE — Progress Notes (Signed)
[facility]  [facility] Room Number: 245-P Place of Service:  ALF [phone]) Provider:  Durenda Age, NP  Patient Care Team: Kirk Ruths, MD as PCP - General (Internal Medicine) Gerlene Fee, NP as Nurse Practitioner (Geriatric Medicine)  Extended Emergency Contact Information Primary Emergency Contact: [facility] Address: [place] [address] [name] of [place] Phone: [phone] Work Phone: [phone] Mobile Phone: [phone] Relation: Son Secondary Emergency Contact: Nelia Shi Address: [address]          [place], [place] [address] [name] of [place] Phone: [phone] Work Phone: [phone] Mobile Phone: [phone] Relation: Daughter  Code Status:  DNR  Goals of care: Advanced Directive information Advanced Directives 03/22/2018  Does Patient Have a Medical Advance Directive? Yes  Type of Advance Directive Out of facility DNR (pink MOST or yellow form)  Does patient want to make changes to medical advance directive? No - Patient declined  Copy of [place] in Chart? -  Pre-existing out of facility DNR order (yellow form or pink MOST form) Yellow form placed in chart (order not valid for inpatient use)     Chief Complaint  Patient presents with  . Acute Visit    Upper respiratory tract symptoms.    HPI:  Pt is a 86 y.o. male seen today for an acute visit for symptoms of an URI. He is a long-term care resident of [facility]. He has a PMH of hypertension, Alzheimer's, OSA, BPH, TBI, and glaucoma. He was reported to have low grade fever and dry cough over the weekend. Today' s temperature was 5 F. He denies having cough. No noted dry cough during the visit. He denies any pain.   Past Medical History:  Diagnosis Date  . Anemia   . Benign prostatic hyperplasia   . BPH without obstruction/lower urinary tract symptoms 03/27/2012  . Cervical  vertebral fracture (HCC)    C7 with small subdural hematoma  . Dementia ([place])   . Depression 02/05/2015   unspecified  . Dysphagia   . Facial fracture ([place])   . Falling   . Glaucoma   . Hypertension   . Intracranial injury ([place])   . Macular degeneration    early/dry  . Malignant neoplasm of prostate ([place]) 05/22/2012  . Osteoarthritis   . POAG (primary open-angle glaucoma)   . Sleep apnea 02/04/2015  . Spondylosis of cervical joint   . Subdural hematoma ([place])   . Traumatic brain injury ([place])   . Urge incontinence 03/27/2012   Past Surgical History:  Procedure Laterality Date  . APPENDECTOMY    . AQUEOUS SHUNT Right 03/17/2015   Procedure:  . CHOLECYSTECTOMY     x 2  . GALLBLADDER SURGERY    . GLAUCOMA SURGERY Right 12/2006  . HERNIA REPAIR    . LENS SURGERY Right 12/29/2010   With i-stent  . LENS SURGERY Left 02/28/2011   With i-stent  . TONSILLECTOMY    . TURP VAPORIZATION      No Known Allergies  Outpatient Encounter Medications as of 04/24/2018  Medication Sig  . acetaminophen (MAPAP ARTHRITIS PAIN) 650 MG CR tablet Take 1,300 mg by mouth 2 (two) times daily.  Marland Kitchen amLODipine (NORVASC) 5 MG tablet Take 10 mg by mouth daily. Two tablets to = 10 mg  . aspirin EC 81 MG tablet Take 81 mg by mouth daily.   . Azelastine HCl (  ASTEPRO) 0.15 % SOLN Place 2 sprays into both nostrils at bedtime.   . brimonidine (ALPHAGAN) 0.2 % ophthalmic solution Place 1 drop into both eyes 2 (two) times daily. Per Dr. Laveda Norman, MD  . carvedilol (COREG) 6.25 MG tablet Take 6.25 mg by mouth every 12 (twelve) hours.   . chlorhexidine (PERIDEX) 0.12 % solution Use as directed 10 mLs in the mouth or throat 2 (two) times daily.  . Cholecalciferol (VITAMIN D3) 2000 units capsule Take 4,000 Units by mouth daily. 2 caps to = 4000 units  . citalopram (CELEXA) 20 MG tablet Take 1 tablet (20 mg total) by mouth daily.  . coal tar (NEUTROGENA T-GEL) 0.5 % shampoo Apply 1 application topically once a week.  On thursdays  . dorzolamide-timolol (COSOPT) 22.3-6.8 MG/ML ophthalmic solution Place 1 drop into both eyes 2 (two) times daily. Per Dr. Arlis Porta. Wait 5 mins between admin of multiple eye drops  . folic acid (FOLVITE) 132 MCG tablet Take 400 mcg by mouth daily.   Marland Kitchen guaiFENesin (ROBITUSSIN) 100 MG/5ML liquid Take 200 mg by mouth 3 (three) times daily.  Marland Kitchen lidocaine (ASPERCREME W/LIDOCAINE) 4 % cream Apply 1 application topically 3 (three) times daily as needed. Apply thin film to lumbo-sacral area for pain  . loperamide (IMODIUM) 2 MG capsule Give 4 mg by mouth prn for loose stools and 2 mg after each subsequent loose stool up to 8 doses in 24 hours  . magnesium hydroxide (MILK OF MAGNESIA) 400 MG/5ML suspension Take 30 mLs by mouth daily as needed for mild constipation. For constipation no results in 24 hours may administer bisacodyl supp  . memantine (NAMENDA) 5 MG tablet Take 5 mg by mouth 2 (two) times daily.  . mometasone (NASONEX) 50 MCG/ACT nasal spray Place 2 sprays into the nose every morning.   . potassium chloride (MICRO-K) 10 MEQ CR capsule Take 10 mEq by mouth 2 (two) times daily.   . sennosides-docusate sodium (SENOKOT-S) 8.6-50 MG tablet Take 1 tablet by mouth at bedtime.  . Skin Protectants, Misc. (ENDIT EX) Apply liberal amount topically to areas of skin irritation as needed. Ok to leave at bedside.  . tamsulosin (FLOMAX) 0.4 MG CAPS capsule Take 0.4 mg by mouth daily.  . traZODone (DESYREL) 50 MG tablet Take 50 mg by mouth at bedtime. OK to hold if pt is sleeping  . UNABLE TO FIND Regular Diet:  Regular consistency, continue thin liquids  . White Petrolatum-Mineral Oil (REFRESH LACRI-LUBE) OINT Apply to both eyes nightly for dryness at bedtime   No facility-administered encounter medications on file as of 04/24/2018.     Review of Systems  Unable to obtain  Due to dementia    Immunization History  Administered Date(s) Administered  . Influenza, High Dose Seasonal PF  11/25/2016  . Influenza-Unspecified 11/14/2015, 11/02/2016, 12/23/2017  . PPD Test 03/05/2016, 03/14/2017  . Pneumococcal Conjugate-13 09/27/2017   Pertinent  Health Maintenance Due  Topic Date Due  . PNA vac Low Risk Adult (2 of 2 - PPSV23) 09/28/2018  . INFLUENZA VACCINE  Completed    Vitals:   04/24/18 0916  BP: 136/84  Pulse: 78  Resp: 18  Temp: (!) 97 F (36.1 C)  TempSrc: Oral  SpO2: 98%  Weight: 172 lb (78 kg)  Height: 5\' 5"  (1.651 m)   Body mass index is 28.62 kg/m.  Physical Exam  GENERAL APPEARANCE: Well nourished. In no acute distress. Normal body habitus SKIN:  Skin is warm and dry.  MOUTH and THROAT:  Lips are without lesions. Oral mucosa is moist and without lesions. Tongue is normal in shape, size, and color and without lesions RESPIRATORY: Breathing is even & unlabored, BS CTAB CARDIAC: RRR, no murmur,no extra heart sounds, no edema GI: Abdomen soft, normal BS, no masses, no tenderness, no hepatomegaly, no splenomegaly EXTREMITIES: Able to move X 4 extremities NEUROLOGICAL: There is no tremor. Speech is clear. Alert to self, disoriented to time and place PSYCHIATRIC:  Affect and behavior are appropriate   Labs reviewed: Recent Labs    05/05/17 0838 11/18/17 1327  NA 140 138  K 3.6 3.8  CL 108 107  CO2 23 25  GLUCOSE 96 139*  BUN 21* 23  CREATININE 0.99 1.02  CALCIUM 8.5* 8.7*  MG 2.3 2.3   Recent Labs    05/05/17 0838 11/18/17 1327  AST 18 17  ALT 17 16  ALKPHOS 51 66  BILITOT 0.6 0.4  PROT 6.2* 7.0  ALBUMIN 3.4* 3.9   Recent Labs    05/05/17 0838 11/18/17 1327  WBC 6.4 6.5  NEUTROABS 3.8 4.2  HGB 11.9* 12.5*  HCT 35.5* 38.9*  MCV 92.8 94.4  PLT 241 364   Lab Results  Component Value Date   TSH 2.221 05/05/2017    Assessment/Plan  1. Fever, unspecified fever cause - no fever at this time, will monitor and notify provider for fever.  2. Cough - continue Guaifenesin 100 mg/5 ml give 10 ml TID X 3 days then discontinue    Family/ staff Communication: Discussed plan of care with resident and charge nurse.  Labs/tests ordered:  None  Goals of care:   Long-term care.   Durenda Age, NP [facility] and Adult Medicine [phone] (Monday-Friday 8:00 a.m. - 5:00 p.m.) [phone] (after hours)

## 2018-04-26 DIAGNOSIS — R1312 Dysphagia, oropharyngeal phase: Secondary | ICD-10-CM | POA: Diagnosis not present

## 2018-04-26 DIAGNOSIS — M47812 Spondylosis without myelopathy or radiculopathy, cervical region: Secondary | ICD-10-CM | POA: Diagnosis not present

## 2018-04-26 DIAGNOSIS — G301 Alzheimer's disease with late onset: Secondary | ICD-10-CM | POA: Diagnosis not present

## 2018-04-26 DIAGNOSIS — F0280 Dementia in other diseases classified elsewhere without behavioral disturbance: Secondary | ICD-10-CM | POA: Diagnosis not present

## 2018-04-27 DIAGNOSIS — G301 Alzheimer's disease with late onset: Secondary | ICD-10-CM | POA: Diagnosis not present

## 2018-04-27 DIAGNOSIS — R1312 Dysphagia, oropharyngeal phase: Secondary | ICD-10-CM | POA: Diagnosis not present

## 2018-04-27 DIAGNOSIS — F0280 Dementia in other diseases classified elsewhere without behavioral disturbance: Secondary | ICD-10-CM | POA: Diagnosis not present

## 2018-04-27 DIAGNOSIS — M47812 Spondylosis without myelopathy or radiculopathy, cervical region: Secondary | ICD-10-CM | POA: Diagnosis not present

## 2018-05-01 DIAGNOSIS — R1312 Dysphagia, oropharyngeal phase: Secondary | ICD-10-CM | POA: Diagnosis not present

## 2018-05-01 DIAGNOSIS — F0280 Dementia in other diseases classified elsewhere without behavioral disturbance: Secondary | ICD-10-CM | POA: Diagnosis not present

## 2018-05-01 DIAGNOSIS — G301 Alzheimer's disease with late onset: Secondary | ICD-10-CM | POA: Diagnosis not present

## 2018-05-01 DIAGNOSIS — M47812 Spondylosis without myelopathy or radiculopathy, cervical region: Secondary | ICD-10-CM | POA: Diagnosis not present

## 2018-05-03 DIAGNOSIS — M47812 Spondylosis without myelopathy or radiculopathy, cervical region: Secondary | ICD-10-CM | POA: Diagnosis not present

## 2018-05-03 DIAGNOSIS — R1312 Dysphagia, oropharyngeal phase: Secondary | ICD-10-CM | POA: Diagnosis not present

## 2018-05-03 DIAGNOSIS — G301 Alzheimer's disease with late onset: Secondary | ICD-10-CM | POA: Diagnosis not present

## 2018-05-03 DIAGNOSIS — F0280 Dementia in other diseases classified elsewhere without behavioral disturbance: Secondary | ICD-10-CM | POA: Diagnosis not present

## 2018-05-10 ENCOUNTER — Encounter
Admission: RE | Admit: 2018-05-10 | Discharge: 2018-05-10 | Disposition: A | Discharge status: Home / Self Care | Payer: Medicare Other | Source: Ambulatory Visit | Attending: Internal Medicine | Admitting: Internal Medicine

## 2018-05-10 DIAGNOSIS — M47812 Spondylosis without myelopathy or radiculopathy, cervical region: Secondary | ICD-10-CM | POA: Diagnosis not present

## 2018-05-10 DIAGNOSIS — R05 Cough: Secondary | ICD-10-CM | POA: Insufficient documentation

## 2018-05-10 DIAGNOSIS — G301 Alzheimer's disease with late onset: Secondary | ICD-10-CM | POA: Diagnosis not present

## 2018-05-10 DIAGNOSIS — R1312 Dysphagia, oropharyngeal phase: Secondary | ICD-10-CM | POA: Diagnosis not present

## 2018-05-10 DIAGNOSIS — F0280 Dementia in other diseases classified elsewhere without behavioral disturbance: Secondary | ICD-10-CM | POA: Diagnosis not present

## 2018-05-12 DIAGNOSIS — M47812 Spondylosis without myelopathy or radiculopathy, cervical region: Secondary | ICD-10-CM | POA: Diagnosis not present

## 2018-05-12 DIAGNOSIS — R1312 Dysphagia, oropharyngeal phase: Secondary | ICD-10-CM | POA: Diagnosis not present

## 2018-05-12 DIAGNOSIS — F0280 Dementia in other diseases classified elsewhere without behavioral disturbance: Secondary | ICD-10-CM | POA: Diagnosis not present

## 2018-05-12 DIAGNOSIS — G301 Alzheimer's disease with late onset: Secondary | ICD-10-CM | POA: Diagnosis not present

## 2018-05-15 DIAGNOSIS — Z593 Problems related to living in residential institution: Secondary | ICD-10-CM | POA: Diagnosis not present

## 2018-05-15 DIAGNOSIS — I5032 Chronic diastolic (congestive) heart failure: Secondary | ICD-10-CM | POA: Diagnosis not present

## 2018-05-15 DIAGNOSIS — F325 Major depressive disorder, single episode, in full remission: Secondary | ICD-10-CM | POA: Diagnosis not present

## 2018-05-15 DIAGNOSIS — G301 Alzheimer's disease with late onset: Secondary | ICD-10-CM | POA: Diagnosis not present

## 2018-05-15 DIAGNOSIS — Z0000 Encounter for general adult medical examination without abnormal findings: Secondary | ICD-10-CM | POA: Diagnosis not present

## 2018-05-15 DIAGNOSIS — F0280 Dementia in other diseases classified elsewhere without behavioral disturbance: Secondary | ICD-10-CM | POA: Diagnosis not present

## 2018-05-17 DIAGNOSIS — G301 Alzheimer's disease with late onset: Secondary | ICD-10-CM | POA: Diagnosis not present

## 2018-05-17 DIAGNOSIS — M47812 Spondylosis without myelopathy or radiculopathy, cervical region: Secondary | ICD-10-CM | POA: Diagnosis not present

## 2018-05-17 DIAGNOSIS — R1312 Dysphagia, oropharyngeal phase: Secondary | ICD-10-CM | POA: Diagnosis not present

## 2018-05-17 DIAGNOSIS — F0280 Dementia in other diseases classified elsewhere without behavioral disturbance: Secondary | ICD-10-CM | POA: Diagnosis not present

## 2018-05-18 ENCOUNTER — Other Ambulatory Visit: Payer: Self-pay

## 2018-05-18 MED ORDER — TAMSULOSIN HCL 0.4 MG PO CAPS: 0.4000 mg | ORAL_CAPSULE | Freq: Every day | ORAL | 3 refills | Status: AC

## 2018-05-19 DIAGNOSIS — M47812 Spondylosis without myelopathy or radiculopathy, cervical region: Secondary | ICD-10-CM | POA: Diagnosis not present

## 2018-05-19 DIAGNOSIS — R1312 Dysphagia, oropharyngeal phase: Secondary | ICD-10-CM | POA: Diagnosis not present

## 2018-05-19 DIAGNOSIS — F0280 Dementia in other diseases classified elsewhere without behavioral disturbance: Secondary | ICD-10-CM | POA: Diagnosis not present

## 2018-05-19 DIAGNOSIS — G301 Alzheimer's disease with late onset: Secondary | ICD-10-CM | POA: Diagnosis not present

## 2018-05-24 DIAGNOSIS — G301 Alzheimer's disease with late onset: Secondary | ICD-10-CM | POA: Diagnosis not present

## 2018-05-24 DIAGNOSIS — M47812 Spondylosis without myelopathy or radiculopathy, cervical region: Secondary | ICD-10-CM | POA: Diagnosis not present

## 2018-05-24 DIAGNOSIS — R1312 Dysphagia, oropharyngeal phase: Secondary | ICD-10-CM | POA: Diagnosis not present

## 2018-05-24 DIAGNOSIS — F0280 Dementia in other diseases classified elsewhere without behavioral disturbance: Secondary | ICD-10-CM | POA: Diagnosis not present

## 2018-05-26 DIAGNOSIS — R1312 Dysphagia, oropharyngeal phase: Secondary | ICD-10-CM | POA: Diagnosis not present

## 2018-05-26 DIAGNOSIS — M47812 Spondylosis without myelopathy or radiculopathy, cervical region: Secondary | ICD-10-CM | POA: Diagnosis not present

## 2018-05-26 DIAGNOSIS — F0280 Dementia in other diseases classified elsewhere without behavioral disturbance: Secondary | ICD-10-CM | POA: Diagnosis not present

## 2018-05-26 DIAGNOSIS — G301 Alzheimer's disease with late onset: Secondary | ICD-10-CM | POA: Diagnosis not present

## 2018-05-29 DIAGNOSIS — F0280 Dementia in other diseases classified elsewhere without behavioral disturbance: Secondary | ICD-10-CM | POA: Diagnosis not present

## 2018-05-29 DIAGNOSIS — R1312 Dysphagia, oropharyngeal phase: Secondary | ICD-10-CM | POA: Diagnosis not present

## 2018-05-29 DIAGNOSIS — G301 Alzheimer's disease with late onset: Secondary | ICD-10-CM | POA: Diagnosis not present

## 2018-05-29 DIAGNOSIS — M47812 Spondylosis without myelopathy or radiculopathy, cervical region: Secondary | ICD-10-CM | POA: Diagnosis not present

## 2018-05-31 DIAGNOSIS — G301 Alzheimer's disease with late onset: Secondary | ICD-10-CM | POA: Diagnosis not present

## 2018-05-31 DIAGNOSIS — M47812 Spondylosis without myelopathy or radiculopathy, cervical region: Secondary | ICD-10-CM | POA: Diagnosis not present

## 2018-05-31 DIAGNOSIS — R1312 Dysphagia, oropharyngeal phase: Secondary | ICD-10-CM | POA: Diagnosis not present

## 2018-05-31 DIAGNOSIS — F0280 Dementia in other diseases classified elsewhere without behavioral disturbance: Secondary | ICD-10-CM | POA: Diagnosis not present

## 2018-06-01 ENCOUNTER — Non-Acute Institutional Stay: Payer: Medicare Other | Admitting: Adult Health

## 2018-06-01 ENCOUNTER — Encounter: Payer: Self-pay | Admitting: Adult Health

## 2018-06-01 DIAGNOSIS — N401 Enlarged prostate with lower urinary tract symptoms: Secondary | ICD-10-CM

## 2018-06-01 DIAGNOSIS — J3089 Other allergic rhinitis: Secondary | ICD-10-CM | POA: Diagnosis not present

## 2018-06-01 DIAGNOSIS — I10 Essential (primary) hypertension: Secondary | ICD-10-CM | POA: Diagnosis not present

## 2018-06-01 DIAGNOSIS — N138 Other obstructive and reflux uropathy: Secondary | ICD-10-CM | POA: Diagnosis not present

## 2018-06-01 DIAGNOSIS — F0280 Dementia in other diseases classified elsewhere without behavioral disturbance: Secondary | ICD-10-CM

## 2018-06-01 DIAGNOSIS — G4709 Other insomnia: Secondary | ICD-10-CM | POA: Diagnosis not present

## 2018-06-01 DIAGNOSIS — M1990 Unspecified osteoarthritis, unspecified site: Secondary | ICD-10-CM

## 2018-06-01 DIAGNOSIS — G301 Alzheimer's disease with late onset: Secondary | ICD-10-CM

## 2018-06-01 NOTE — Progress Notes (Signed)
[facility]  [facility] Room Number: [place]:  ALF [phone]) Provider:  Durenda Age, NP  Patient Care Team: Kirk Ruths, MD as PCP - General (Internal Medicine) Gerlene Fee, NP as Nurse Practitioner (Geriatric Medicine)  Extended Emergency Contact Information Primary Emergency Contact: [facility] Address: [place] [address] [name] of [place] Phone: [phone] Work Phone: [phone] Mobile Phone: [phone] Relation: Son Secondary Emergency Contact: Nelia Shi Address: [address]          [place], [place] [address] [name] of [place] Phone: [phone] Work Phone: [phone] Mobile Phone: [phone] Relation: Daughter  Code Status:  DNR  Goals of care: Advanced Directive information Advanced Directives 06/01/2018  Does Patient Have a Medical Advance Directive? Yes  Type of Advance Directive Out of facility DNR (pink MOST or yellow form)  Does patient want to make changes to medical advance directive? No - Guardian declined  Copy of [place] in Chart? -  Pre-existing out of facility DNR order (yellow form or pink MOST form) Yellow form placed in chart (order not valid for inpatient use)     Chief Complaint  Patient presents with  . Medical Management of Chronic Issues    Routine Visit    HPI:  Pt is a 85 y.o. male seen today for medical management of chronic diseases.  He has PMH of hypertension, Alzheimer's disease, OSA, BPH, TBII and glaucoma.  He was seen in his room today. No report of having difficulty sleeping. BPs noted to be stable.   Past Medical History:  Diagnosis Date  . Anemia   . Benign prostatic hyperplasia   . BPH without obstruction/lower urinary tract symptoms 03/27/2012  . Cervical vertebral fracture (HCC)    C7 with small subdural hematoma  . Dementia ([place])   . Depression 02/05/2015   unspecified   . Dysphagia   . Facial fracture ([place])   . Falling   . Glaucoma   . Hypertension   . Intracranial injury ([place])   . Macular degeneration    early/dry  . Malignant neoplasm of prostate ([place]) 05/22/2012  . Osteoarthritis   . POAG (primary open-angle glaucoma)   . Sleep apnea 02/04/2015  . Spondylosis of cervical joint   . Subdural hematoma ([place])   . Traumatic brain injury ([place])   . Urge incontinence 03/27/2012   Past Surgical History:  Procedure Laterality Date  . APPENDECTOMY    . AQUEOUS SHUNT Right 03/17/2015   Procedure:  . CHOLECYSTECTOMY     x 2  . GALLBLADDER SURGERY    . GLAUCOMA SURGERY Right 12/2006  . HERNIA REPAIR    . LENS SURGERY Right 12/29/2010   With i-stent  . LENS SURGERY Left 02/28/2011   With i-stent  . TONSILLECTOMY    . TURP VAPORIZATION      No Known Allergies  Outpatient Encounter Medications as of 06/01/2018  Medication Sig  . acetaminophen (MAPAP ARTHRITIS PAIN) 650 MG CR tablet Take 1,300 mg by mouth 2 (two) times daily.  Marland Kitchen amLODipine (NORVASC) 5 MG tablet Take 10 mg by mouth daily. Two tablets to = 10 mg  . aspirin EC 81 MG tablet Take 81 mg by mouth daily.   . Azelastine HCl (ASTEPRO) 0.15 % SOLN Place 2 sprays into both nostrils at bedtime.   . brimonidine (ALPHAGAN) 0.2 % ophthalmic solution Place 1 drop into both  eyes 2 (two) times daily. Per Dr. Laveda Norman, MD  . carvedilol (COREG) 6.25 MG tablet Take 6.25 mg by mouth every 12 (twelve) hours.   . chlorhexidine (PERIDEX) 0.12 % solution Use as directed 10 mLs in the mouth or throat 2 (two) times daily.  . Cholecalciferol (VITAMIN D3) 2000 units capsule Take 4,000 Units by mouth daily. 2 caps to = 4000 units  . citalopram (CELEXA) 20 MG tablet Take 1 tablet (20 mg total) by mouth daily.  . coal tar (NEUTROGENA T-GEL) 0.5 % shampoo Apply 1 application topically once a week. On thursdays  . dorzolamide-timolol (COSOPT) 22.3-6.8 MG/ML ophthalmic solution Place 1 drop into both eyes 2 (two) times  daily. Per Dr. Arlis Porta. Wait 5 mins between admin of multiple eye drops  . folic acid (FOLVITE) 128 MCG tablet Take 400 mcg by mouth daily.   Marland Kitchen lidocaine (ASPERCREME W/LIDOCAINE) 4 % cream Apply 1 application topically 3 (three) times daily as needed. Apply thin film to lumbo-sacral area for pain  . loperamide (IMODIUM) 2 MG capsule Give 4 mg by mouth prn for loose stools and 2 mg after each subsequent loose stool up to 8 doses in 24 hours  . magnesium hydroxide (MILK OF MAGNESIA) 400 MG/5ML suspension Take 30 mLs by mouth daily as needed for mild constipation. For constipation no results in 24 hours may administer bisacodyl supp  . memantine (NAMENDA) 5 MG tablet Take 5 mg by mouth 2 (two) times daily.  . mometasone (NASONEX) 50 MCG/ACT nasal spray Place 2 sprays into the nose every morning.   . potassium chloride (MICRO-K) 10 MEQ CR capsule Take 10 mEq by mouth 2 (two) times daily.   . sennosides-docusate sodium (SENOKOT-S) 8.6-50 MG tablet Take 1 tablet by mouth at bedtime.  . Skin Protectants, Misc. (ENDIT EX) Apply liberal amount topically to areas of skin irritation as needed. Ok to leave at bedside.  . tamsulosin (FLOMAX) 0.4 MG CAPS capsule Take 1 capsule (0.4 mg total) by mouth daily.  . traZODone (DESYREL) 50 MG tablet Take 50 mg by mouth at bedtime. OK to hold if pt is sleeping  . UNABLE TO FIND Regular Diet:  Regular consistency, continue thin liquids  . White Petrolatum-Mineral Oil (REFRESH LACRI-LUBE) OINT Apply to both eyes nightly for dryness at bedtime  . [DISCONTINUED] guaiFENesin (ROBITUSSIN) 100 MG/5ML liquid Take 200 mg by mouth 3 (three) times daily.   No facility-administered encounter medications on file as of 06/01/2018.     Review of Systems  Unable to obtain due to dementia    Immunization History  Administered Date(s) Administered  . Influenza, High Dose Seasonal PF 11/25/2016  . Influenza-Unspecified 11/14/2015, 11/02/2016, 12/23/2017  . PPD Test 03/05/2016,  03/14/2017  . Pneumococcal Conjugate-13 09/27/2017   Pertinent  Health Maintenance Due  Topic Date Due  . INFLUENZA VACCINE  09/09/2018  . PNA vac Low Risk Adult (2 of 2 - PPSV23) 09/28/2018   No flowsheet data found.   Vitals:   06/01/18 0844  BP: 140/70  Pulse: 64  Resp: 18  Temp: 98.2 F (36.8 C)  TempSrc: Oral  SpO2: 98%  Weight: 165 lb 8 oz (75.1 kg)  Height: 5\' 5"  (1.651 m)   Body mass index is 27.54 kg/m.  Physical Exam  GENERAL APPEARANCE: Well nourished. In no acute distress. Normal body habitus SKIN:  Skin is warm and dry.  MOUTH and THROAT: Lips are without lesions. Oral mucosa is moist and without lesions. Tongue is normal in shape, size,  and color and without lesions RESPIRATORY: Breathing is even & unlabored, BS CTAB CARDIAC: RRR, no murmur,no extra heart sounds, no edema GI: Abdomen soft, normal BS, no masses, no tenderness EXTREMITIES:  Able to move X 4 extremities NEUROLOGICAL: There is no tremor. Alert to self, disoriented to time and place. PSYCHIATRIC:  Affect and behavior are appropriate   Labs reviewed: Recent Labs    11/18/17 1327  NA 138  K 3.8  CL 107  CO2 25  GLUCOSE 139*  BUN 23  CREATININE 1.02  CALCIUM 8.7*  MG 2.3   Recent Labs    11/18/17 1327  AST 17  ALT 16  ALKPHOS 66  BILITOT 0.4  PROT 7.0  ALBUMIN 3.9   Recent Labs    11/18/17 1327  WBC 6.5  NEUTROABS 4.2  HGB 12.5*  HCT 38.9*  MCV 94.4  PLT 364   Lab Results  Component Value Date   TSH 2.221 05/05/2017      Assessment/Plan  1. Benign prostatic hyperplasia with urinary obstruction - stable, continue tamsulosin 0.4 mg 1 capsule daily  2. Non-seasonal allergic rhinitis due to other allergic trigger - Stable, continue Nasonex 50 mcg/ACT 2 sprays to each nostril daily  3. Essential (primary) hypertension Well-controlled, continue carvedilol 6.25 mg 1 tab every 12 hours and amlodipine 5 mg 2 tabs daily  4. Other insomnia -Sleeps well at night  -Continue trazodone 50 mg 1 tab at bedtime  5. Osteoarthritis, unspecified osteoarthritis type, unspecified site -Denies joint pains, continue Aspercreme 4% apply thin film to lumbosacral area 3 times daily as needed, acetaminophen arthritis 650 mg 2 tabs= 1300 mg twice a day  6. Late onset Alzheimer's disease without behavioral disturbance (HCC) -Continue Namenda 5 mg 1 tab twice a day, supportive care and fall precautions   Family/ staff Communication: Discussed plan of care with charge nurse.  Labs/tests ordered:  None  Goals of care:  Long-term ALF   Durenda Age, NP [facility] and Adult Medicine [phone] (Monday-Friday 8:00 a.m. - 5:00 p.m.) [phone] (after hours)

## 2018-06-05 DIAGNOSIS — G301 Alzheimer's disease with late onset: Secondary | ICD-10-CM | POA: Diagnosis not present

## 2018-06-05 DIAGNOSIS — M47812 Spondylosis without myelopathy or radiculopathy, cervical region: Secondary | ICD-10-CM | POA: Diagnosis not present

## 2018-06-05 DIAGNOSIS — F0280 Dementia in other diseases classified elsewhere without behavioral disturbance: Secondary | ICD-10-CM | POA: Diagnosis not present

## 2018-06-05 DIAGNOSIS — R1312 Dysphagia, oropharyngeal phase: Secondary | ICD-10-CM | POA: Diagnosis not present

## 2018-06-07 DIAGNOSIS — F0280 Dementia in other diseases classified elsewhere without behavioral disturbance: Secondary | ICD-10-CM | POA: Diagnosis not present

## 2018-06-07 DIAGNOSIS — G301 Alzheimer's disease with late onset: Secondary | ICD-10-CM | POA: Diagnosis not present

## 2018-06-07 DIAGNOSIS — M47812 Spondylosis without myelopathy or radiculopathy, cervical region: Secondary | ICD-10-CM | POA: Diagnosis not present

## 2018-06-07 DIAGNOSIS — R1312 Dysphagia, oropharyngeal phase: Secondary | ICD-10-CM | POA: Diagnosis not present

## 2018-06-09 ENCOUNTER — Encounter
Admission: RE | Admit: 2018-06-09 | Discharge: 2018-06-09 | Disposition: A | Discharge status: Home / Self Care | Payer: Medicare Other | Source: Ambulatory Visit | Attending: Internal Medicine | Admitting: Internal Medicine

## 2018-06-09 DIAGNOSIS — R05 Cough: Secondary | ICD-10-CM | POA: Insufficient documentation

## 2018-06-12 DIAGNOSIS — F0280 Dementia in other diseases classified elsewhere without behavioral disturbance: Secondary | ICD-10-CM | POA: Diagnosis not present

## 2018-06-12 DIAGNOSIS — R1312 Dysphagia, oropharyngeal phase: Secondary | ICD-10-CM | POA: Diagnosis not present

## 2018-06-12 DIAGNOSIS — G301 Alzheimer's disease with late onset: Secondary | ICD-10-CM | POA: Diagnosis not present

## 2018-06-12 DIAGNOSIS — M47812 Spondylosis without myelopathy or radiculopathy, cervical region: Secondary | ICD-10-CM | POA: Diagnosis not present

## 2018-06-14 DIAGNOSIS — R1312 Dysphagia, oropharyngeal phase: Secondary | ICD-10-CM | POA: Diagnosis not present

## 2018-06-14 DIAGNOSIS — G301 Alzheimer's disease with late onset: Secondary | ICD-10-CM | POA: Diagnosis not present

## 2018-06-14 DIAGNOSIS — M47812 Spondylosis without myelopathy or radiculopathy, cervical region: Secondary | ICD-10-CM | POA: Diagnosis not present

## 2018-06-14 DIAGNOSIS — F0280 Dementia in other diseases classified elsewhere without behavioral disturbance: Secondary | ICD-10-CM | POA: Diagnosis not present

## 2018-06-21 DIAGNOSIS — R1312 Dysphagia, oropharyngeal phase: Secondary | ICD-10-CM | POA: Diagnosis not present

## 2018-06-21 DIAGNOSIS — F0280 Dementia in other diseases classified elsewhere without behavioral disturbance: Secondary | ICD-10-CM | POA: Diagnosis not present

## 2018-06-21 DIAGNOSIS — M47812 Spondylosis without myelopathy or radiculopathy, cervical region: Secondary | ICD-10-CM | POA: Diagnosis not present

## 2018-06-21 DIAGNOSIS — G301 Alzheimer's disease with late onset: Secondary | ICD-10-CM | POA: Diagnosis not present

## 2018-06-23 DIAGNOSIS — M47812 Spondylosis without myelopathy or radiculopathy, cervical region: Secondary | ICD-10-CM | POA: Diagnosis not present

## 2018-06-23 DIAGNOSIS — G301 Alzheimer's disease with late onset: Secondary | ICD-10-CM | POA: Diagnosis not present

## 2018-06-23 DIAGNOSIS — F0280 Dementia in other diseases classified elsewhere without behavioral disturbance: Secondary | ICD-10-CM | POA: Diagnosis not present

## 2018-06-23 DIAGNOSIS — R1312 Dysphagia, oropharyngeal phase: Secondary | ICD-10-CM | POA: Diagnosis not present

## 2018-06-27 DIAGNOSIS — R1312 Dysphagia, oropharyngeal phase: Secondary | ICD-10-CM | POA: Diagnosis not present

## 2018-06-27 DIAGNOSIS — G301 Alzheimer's disease with late onset: Secondary | ICD-10-CM | POA: Diagnosis not present

## 2018-06-27 DIAGNOSIS — M47812 Spondylosis without myelopathy or radiculopathy, cervical region: Secondary | ICD-10-CM | POA: Diagnosis not present

## 2018-06-27 DIAGNOSIS — F0280 Dementia in other diseases classified elsewhere without behavioral disturbance: Secondary | ICD-10-CM | POA: Diagnosis not present

## 2018-06-30 DIAGNOSIS — M47812 Spondylosis without myelopathy or radiculopathy, cervical region: Secondary | ICD-10-CM | POA: Diagnosis not present

## 2018-06-30 DIAGNOSIS — R1312 Dysphagia, oropharyngeal phase: Secondary | ICD-10-CM | POA: Diagnosis not present

## 2018-06-30 DIAGNOSIS — F0280 Dementia in other diseases classified elsewhere without behavioral disturbance: Secondary | ICD-10-CM | POA: Diagnosis not present

## 2018-06-30 DIAGNOSIS — G301 Alzheimer's disease with late onset: Secondary | ICD-10-CM | POA: Diagnosis not present

## 2018-07-03 DIAGNOSIS — R1312 Dysphagia, oropharyngeal phase: Secondary | ICD-10-CM | POA: Diagnosis not present

## 2018-07-03 DIAGNOSIS — G301 Alzheimer's disease with late onset: Secondary | ICD-10-CM | POA: Diagnosis not present

## 2018-07-03 DIAGNOSIS — M47812 Spondylosis without myelopathy or radiculopathy, cervical region: Secondary | ICD-10-CM | POA: Diagnosis not present

## 2018-07-03 DIAGNOSIS — F0280 Dementia in other diseases classified elsewhere without behavioral disturbance: Secondary | ICD-10-CM | POA: Diagnosis not present

## 2018-07-10 ENCOUNTER — Encounter
Admission: RE | Admit: 2018-07-10 | Discharge: 2018-07-10 | Disposition: A | Discharge status: Home / Self Care | Payer: Medicare Other | Source: Ambulatory Visit | Attending: Internal Medicine | Admitting: Internal Medicine

## 2018-07-10 DIAGNOSIS — R05 Cough: Secondary | ICD-10-CM | POA: Insufficient documentation

## 2018-07-12 DIAGNOSIS — R1312 Dysphagia, oropharyngeal phase: Secondary | ICD-10-CM | POA: Diagnosis not present

## 2018-07-12 DIAGNOSIS — M47812 Spondylosis without myelopathy or radiculopathy, cervical region: Secondary | ICD-10-CM | POA: Diagnosis not present

## 2018-07-12 DIAGNOSIS — G301 Alzheimer's disease with late onset: Secondary | ICD-10-CM | POA: Diagnosis not present

## 2018-07-12 DIAGNOSIS — F0280 Dementia in other diseases classified elsewhere without behavioral disturbance: Secondary | ICD-10-CM | POA: Diagnosis not present

## 2018-07-14 DIAGNOSIS — R1312 Dysphagia, oropharyngeal phase: Secondary | ICD-10-CM | POA: Diagnosis not present

## 2018-07-14 DIAGNOSIS — G301 Alzheimer's disease with late onset: Secondary | ICD-10-CM | POA: Diagnosis not present

## 2018-07-14 DIAGNOSIS — F0280 Dementia in other diseases classified elsewhere without behavioral disturbance: Secondary | ICD-10-CM | POA: Diagnosis not present

## 2018-07-14 DIAGNOSIS — M47812 Spondylosis without myelopathy or radiculopathy, cervical region: Secondary | ICD-10-CM | POA: Diagnosis not present

## 2018-07-18 DIAGNOSIS — Z20828 Contact with and (suspected) exposure to other viral communicable diseases: Secondary | ICD-10-CM | POA: Diagnosis not present

## 2018-07-19 DIAGNOSIS — F0280 Dementia in other diseases classified elsewhere without behavioral disturbance: Secondary | ICD-10-CM | POA: Diagnosis not present

## 2018-07-19 DIAGNOSIS — G301 Alzheimer's disease with late onset: Secondary | ICD-10-CM | POA: Diagnosis not present

## 2018-07-19 DIAGNOSIS — R1312 Dysphagia, oropharyngeal phase: Secondary | ICD-10-CM | POA: Diagnosis not present

## 2018-07-19 DIAGNOSIS — M47812 Spondylosis without myelopathy or radiculopathy, cervical region: Secondary | ICD-10-CM | POA: Diagnosis not present

## 2018-07-21 DIAGNOSIS — M47812 Spondylosis without myelopathy or radiculopathy, cervical region: Secondary | ICD-10-CM | POA: Diagnosis not present

## 2018-07-21 DIAGNOSIS — F0280 Dementia in other diseases classified elsewhere without behavioral disturbance: Secondary | ICD-10-CM | POA: Diagnosis not present

## 2018-07-21 DIAGNOSIS — R1312 Dysphagia, oropharyngeal phase: Secondary | ICD-10-CM | POA: Diagnosis not present

## 2018-07-21 DIAGNOSIS — G301 Alzheimer's disease with late onset: Secondary | ICD-10-CM | POA: Diagnosis not present

## 2018-07-24 ENCOUNTER — Non-Acute Institutional Stay: Payer: Medicare Other | Admitting: Adult Health

## 2018-07-24 ENCOUNTER — Encounter: Payer: Self-pay | Admitting: Adult Health

## 2018-07-24 DIAGNOSIS — R400 Somnolence: Secondary | ICD-10-CM

## 2018-07-24 DIAGNOSIS — I10 Essential (primary) hypertension: Secondary | ICD-10-CM

## 2018-07-24 DIAGNOSIS — F0280 Dementia in other diseases classified elsewhere without behavioral disturbance: Secondary | ICD-10-CM

## 2018-07-24 DIAGNOSIS — E876 Hypokalemia: Secondary | ICD-10-CM | POA: Diagnosis not present

## 2018-07-24 DIAGNOSIS — N401 Enlarged prostate with lower urinary tract symptoms: Secondary | ICD-10-CM

## 2018-07-24 DIAGNOSIS — N138 Other obstructive and reflux uropathy: Secondary | ICD-10-CM

## 2018-07-24 DIAGNOSIS — F324 Major depressive disorder, single episode, in partial remission: Secondary | ICD-10-CM | POA: Diagnosis not present

## 2018-07-24 DIAGNOSIS — G301 Alzheimer's disease with late onset: Secondary | ICD-10-CM | POA: Diagnosis not present

## 2018-07-24 NOTE — Progress Notes (Signed)
Location:  [facility] Room Number: [place]:  [phone]) Provider:  Durenda Age, DNP, FNP-BC  Patient Care Team: Kirk Ruths, MD as PCP - General (Internal Medicine) Nyoka Cowden Phylis Bougie, NP as Nurse Practitioner (Geriatric Medicine)  Extended Emergency Contact Information Primary Emergency Contact: [facility] Address: [place] [address] [name] of [place] Phone: [phone] Work Phone: [phone] Mobile Phone: [phone] Relation: Son Secondary Emergency Contact: Nelia Shi Address: [address]          [place], [place] [address] [name] of [place] Phone: [phone] Work Phone: [phone] Mobile Phone: [phone] Relation: Daughter  Code Status:  DNR  Goals of care: Advanced Directive information Advanced Directives 07/24/2018  Does Patient Have a Medical Advance Directive? Yes  Type of Advance Directive Out of facility DNR (pink MOST or yellow form)  Does patient want to make changes to medical advance directive? No - Patient declined  Copy of [place] in Chart? -  Pre-existing out of facility DNR order (yellow form or pink MOST form) Yellow form placed in chart (order not valid for inpatient use)     Chief Complaint  Patient presents with  . Medical Management of Chronic Issues    Routine TVAB visit    HPI:  Pt is a 85 y.o. male seen today for medical management of chronic diseases.  He is a long-term  resident of [place].  He has a PMH of hypertension, Alzheimer's, OSA, BPH, TBI, and glaucoma. He was seen sleeping in his room today. He woke up from verbal greetings. He currently takes Trazodone for insomnia. BPs 136/76, 132/76, 136/66, 129/68. He takes carvedilol and amlodipine for hypertension.   Past Medical History:  Diagnosis Date  . Anemia   . Benign prostatic hyperplasia   . BPH without obstruction/lower urinary tract  symptoms 03/27/2012  . Cervical vertebral fracture (HCC)    C7 with small subdural hematoma  . Dementia ([place])   . Depression 02/05/2015   unspecified  . Dysphagia   . Facial fracture ([place])   . Falling   . Glaucoma   . Hypertension   . Intracranial injury ([place])   . Macular degeneration    early/dry  . Malignant neoplasm of prostate ([place]) 05/22/2012  . Osteoarthritis   . POAG (primary open-angle glaucoma)   . Sleep apnea 02/04/2015  . Spondylosis of cervical joint   . Subdural hematoma ([place])   . Traumatic brain injury ([place])   . Urge incontinence 03/27/2012   Past Surgical History:  Procedure Laterality Date  . APPENDECTOMY    . AQUEOUS SHUNT Right 03/17/2015   Procedure:  . CHOLECYSTECTOMY     x 2  . GALLBLADDER SURGERY    . GLAUCOMA SURGERY Right 12/2006  . HERNIA REPAIR    . LENS SURGERY Right 12/29/2010   With i-stent  . LENS SURGERY Left 02/28/2011   With i-stent  . TONSILLECTOMY    . TURP VAPORIZATION      No Known Allergies  Outpatient Encounter Medications as of 07/24/2018  Medication Sig  . acetaminophen (MAPAP ARTHRITIS PAIN) 650 MG CR tablet Take 1,300 mg by mouth 2 (two) times daily.  Marland Kitchen amLODipine (NORVASC) 5 MG tablet Take 10 mg by mouth daily. Two tablets to = 10 mg  . aspirin EC 81 MG tablet Take 81 mg by mouth daily.   . Azelastine HCl (ASTEPRO) 0.15 %  SOLN Place 2 sprays into both nostrils at bedtime.   . brimonidine (ALPHAGAN) 0.2 % ophthalmic solution Place 1 drop into both eyes 2 (two) times daily. Per Dr. Laveda Norman, MD  . carvedilol (COREG) 6.25 MG tablet Take 6.25 mg by mouth every 12 (twelve) hours.   . chlorhexidine (PERIDEX) 0.12 % solution Use as directed 10 mLs in the mouth or throat 2 (two) times daily.  . Cholecalciferol (VITAMIN D3) 2000 units capsule Take 4,000 Units by mouth daily. 2 caps to = 4000 units  . citalopram (CELEXA) 20 MG tablet Take 1 tablet (20 mg total) by mouth daily.  . coal tar (NEUTROGENA T-GEL) 0.5 % shampoo Apply 1  application topically once a week. On thursdays  . dorzolamide-timolol (COSOPT) 22.3-6.8 MG/ML ophthalmic solution Place 1 drop into both eyes 2 (two) times daily. Per Dr. Arlis Porta. Wait 5 mins between admin of multiple eye drops  . folic acid (FOLVITE) 062 MCG tablet Take 400 mcg by mouth daily.   Marland Kitchen lidocaine (ASPERCREME W/LIDOCAINE) 4 % cream Apply 1 application topically 3 (three) times daily as needed. Apply thin film to lumbo-sacral area for pain  . loperamide (IMODIUM) 2 MG capsule Give 4 mg by mouth prn for loose stools and 2 mg after each subsequent loose stool up to 8 doses in 24 hours  . magnesium hydroxide (MILK OF MAGNESIA) 400 MG/5ML suspension Take 30 mLs by mouth daily as needed for mild constipation. For constipation no results in 24 hours may administer bisacodyl supp  . memantine (NAMENDA) 5 MG tablet Take 5 mg by mouth 2 (two) times daily.  . mometasone (NASONEX) 50 MCG/ACT nasal spray Place 2 sprays into the nose every morning.   . potassium chloride (MICRO-K) 10 MEQ CR capsule Take 10 mEq by mouth 2 (two) times daily.   . sennosides-docusate sodium (SENOKOT-S) 8.6-50 MG tablet Take 1 tablet by mouth at bedtime.  . Skin Protectants, Misc. (ENDIT EX) Apply liberal amount topically to areas of skin irritation as needed. Ok to leave at bedside.  . tamsulosin (FLOMAX) 0.4 MG CAPS capsule Take 1 capsule (0.4 mg total) by mouth daily.  . traZODone (DESYREL) 50 MG tablet Take 50 mg by mouth at bedtime. OK to hold if pt is sleeping  . UNABLE TO FIND Regular Diet:  Regular consistency, continue thin liquids  . White Petrolatum-Mineral Oil (REFRESH LACRI-LUBE) OINT Apply to both eyes nightly for dryness at bedtime   No facility-administered encounter medications on file as of 07/24/2018.     Review of Systems  Unable to obtain due to dementia    Immunization History  Administered Date(s) Administered  . Influenza, High Dose Seasonal PF 11/25/2016  . Influenza-Unspecified 11/14/2015,  11/02/2016, 12/23/2017  . PPD Test 03/05/2016, 03/14/2017  . Pneumococcal Conjugate-13 09/27/2017   Pertinent  Health Maintenance Due  Topic Date Due  . INFLUENZA VACCINE  09/09/2018  . PNA vac Low Risk Adult (2 of 2 - PPSV23) 09/28/2018    Vitals:   07/24/18 1352  BP: 136/66  Pulse: 60  Resp: 18  Temp: (!) 95.5 F (35.3 C)  TempSrc: Oral  SpO2: 96%  Weight: 168 lb 12.8 oz (76.6 kg)  Height: 5\' 5"  (1.651 m)   Body mass index is 28.09 kg/m.  Physical Exam  GENERAL APPEARANCE: Well nourished. In no acute distress. Normal body habitus MOUTH and THROAT: Lips are without lesions. Oral mucosa is moist and without lesions. Tongue is normal in shape, size, and color and without lesions RESPIRATORY:  Breathing is even & unlabored, BS CTAB CARDIAC: RRR, no murmur,no extra heart sounds, BLE 2+ edema GI: Abdomen soft, normal BS, no masses, no tenderness EXTREMITIES:  Able to move x 4 extremities NEUROLOGICAL: There is no tremor. Speech is clear. Alert to self, disoriented to time and pace. Sleepy. PSYCHIATRIC:  Affect and behavior are appropriate  Labs reviewed: Recent Labs    11/18/17 1327  NA 138  K 3.8  CL 107  CO2 25  GLUCOSE 139*  BUN 23  CREATININE 1.02  CALCIUM 8.7*  MG 2.3   Recent Labs    11/18/17 1327  AST 17  ALT 16  ALKPHOS 66  BILITOT 0.4  PROT 7.0  ALBUMIN 3.9   Recent Labs    11/18/17 1327  WBC 6.5  NEUTROABS 4.2  HGB 12.5*  HCT 38.9*  MCV 94.4  PLT 364   Lab Results  Component Value Date   TSH 2.221 05/05/2017    Assessment/Plan  1. Somnolence - will change Trazodone to PRN X 14 days, monitor  2. Essential (primary) hypertension - well- controlled, continue carvedilol 6.25 mg 1 tab every 12 hours and amlodipine 5 mg 2 tabs daily  3. Hypokalemia Lab Results  Component Value Date   K 3.8 11/18/2017  -Continue KCL ER 10 meq 1 capsule BID  4. Benign prostatic hyperplasia with urinary obstruction -Continue tamsulosin 0.4 mg 1  capsule daily  5. Major depressive disorder with single episode, in partial remission (HCC) -Mood is stable, continue citalopram 20 mg 1 tab daily  6. Late onset Alzheimer's disease without behavioral disturbance (HCC) -Continue Namenda 5 mg 1 tab twice a day, fall precautions and supportive care    Family/ staff Communication: Discussed plan of care with resident.  Labs/tests ordered:  CMP  Goals of care:   Long-term care.   Durenda Age, DNP, FNP-BC [facility] [phone] (Monday-Friday 8:00 a.m. - 5:00 p.m.) [phone] (after hours)

## 2018-07-25 DIAGNOSIS — R400 Somnolence: Secondary | ICD-10-CM | POA: Insufficient documentation

## 2018-07-26 DIAGNOSIS — R1312 Dysphagia, oropharyngeal phase: Secondary | ICD-10-CM | POA: Diagnosis not present

## 2018-07-26 DIAGNOSIS — F0280 Dementia in other diseases classified elsewhere without behavioral disturbance: Secondary | ICD-10-CM | POA: Diagnosis not present

## 2018-07-26 DIAGNOSIS — G301 Alzheimer's disease with late onset: Secondary | ICD-10-CM | POA: Diagnosis not present

## 2018-07-26 DIAGNOSIS — M47812 Spondylosis without myelopathy or radiculopathy, cervical region: Secondary | ICD-10-CM | POA: Diagnosis not present

## 2018-07-28 DIAGNOSIS — R1312 Dysphagia, oropharyngeal phase: Secondary | ICD-10-CM | POA: Diagnosis not present

## 2018-07-28 DIAGNOSIS — M47812 Spondylosis without myelopathy or radiculopathy, cervical region: Secondary | ICD-10-CM | POA: Diagnosis not present

## 2018-07-28 DIAGNOSIS — G301 Alzheimer's disease with late onset: Secondary | ICD-10-CM | POA: Diagnosis not present

## 2018-07-28 DIAGNOSIS — F0280 Dementia in other diseases classified elsewhere without behavioral disturbance: Secondary | ICD-10-CM | POA: Diagnosis not present

## 2018-07-31 DIAGNOSIS — K922 Gastrointestinal hemorrhage, unspecified: Secondary | ICD-10-CM | POA: Diagnosis not present

## 2018-07-31 DIAGNOSIS — F0280 Dementia in other diseases classified elsewhere without behavioral disturbance: Secondary | ICD-10-CM | POA: Diagnosis not present

## 2018-07-31 DIAGNOSIS — I5032 Chronic diastolic (congestive) heart failure: Secondary | ICD-10-CM | POA: Diagnosis not present

## 2018-07-31 DIAGNOSIS — G301 Alzheimer's disease with late onset: Secondary | ICD-10-CM | POA: Diagnosis not present

## 2018-08-01 ENCOUNTER — Other Ambulatory Visit
Admission: RE | Admit: 2018-08-01 | Discharge: 2018-08-01 | Disposition: A | Discharge status: Home / Self Care | Payer: Medicare Other | Source: Ambulatory Visit | Attending: Internal Medicine | Admitting: Internal Medicine

## 2018-08-01 ENCOUNTER — Other Ambulatory Visit
Admission: RE | Admit: 2018-08-01 | Discharge: 2018-08-01 | Disposition: A | Discharge status: Home / Self Care | Payer: Medicare Other | Source: Ambulatory Visit | Attending: Adult Health | Admitting: Adult Health

## 2018-08-01 DIAGNOSIS — E876 Hypokalemia: Secondary | ICD-10-CM | POA: Insufficient documentation

## 2018-08-01 LAB — CBC WITH DIFFERENTIAL/PLATELET
Abs Immature Granulocytes: 0.04 10*3/uL (ref 0.00–0.07)
Basophils Absolute: 0 10*3/uL (ref 0.0–0.1)
Basophils Relative: 1 %
Eosinophils Absolute: 0.5 10*3/uL (ref 0.0–0.5)
Eosinophils Relative: 6 %
HCT: 33.3 % — ABNORMAL LOW (ref 39.0–52.0)
Hemoglobin: 10.5 g/dL — ABNORMAL LOW (ref 13.0–17.0)
Immature Granulocytes: 1 %
Lymphocytes Relative: 25 %
Lymphs Abs: 2 10*3/uL (ref 0.7–4.0)
MCH: 30.1 pg (ref 26.0–34.0)
MCHC: 31.5 g/dL (ref 30.0–36.0)
MCV: 95.4 fL (ref 80.0–100.0)
Monocytes Absolute: 0.6 10*3/uL (ref 0.1–1.0)
Monocytes Relative: 8 %
Neutro Abs: 4.6 10*3/uL (ref 1.7–7.7)
Neutrophils Relative %: 59 %
Platelets: 584 10*3/uL — ABNORMAL HIGH (ref 150–400)
RBC: 3.49 MIL/uL — ABNORMAL LOW (ref 4.22–5.81)
RDW: 14.4 % (ref 11.5–15.5)
WBC: 7.8 10*3/uL (ref 4.0–10.5)
nRBC: 0 % (ref 0.0–0.2)

## 2018-08-01 LAB — COMPREHENSIVE METABOLIC PANEL
ALT: 19 U/L (ref 0–44)
AST: 12 U/L — ABNORMAL LOW (ref 15–41)
Albumin: 3.5 g/dL (ref 3.5–5.0)
Alkaline Phosphatase: 52 U/L (ref 38–126)
Anion gap: 8 (ref 5–15)
BUN: 23 mg/dL (ref 8–23)
CO2: 23 mmol/L (ref 22–32)
Calcium: 8.6 mg/dL — ABNORMAL LOW (ref 8.9–10.3)
Chloride: 108 mmol/L (ref 98–111)
Creatinine, Ser: 0.85 mg/dL (ref 0.61–1.24)
GFR calc Af Amer: >60 mL/min (ref >60)
GFR calc non Af Amer: >60 mL/min (ref >60)
Glucose, Bld: 99 mg/dL (ref 70–99)
Potassium: 4 mmol/L (ref 3.5–5.1)
Sodium: 139 mmol/L (ref 135–145)
Total Bilirubin: 0.7 mg/dL (ref 0.3–1.2)
Total Protein: 6.5 g/dL (ref 6.5–8.1)

## 2018-08-03 DIAGNOSIS — R1312 Dysphagia, oropharyngeal phase: Secondary | ICD-10-CM | POA: Diagnosis not present

## 2018-08-03 DIAGNOSIS — M47812 Spondylosis without myelopathy or radiculopathy, cervical region: Secondary | ICD-10-CM | POA: Diagnosis not present

## 2018-08-03 DIAGNOSIS — G301 Alzheimer's disease with late onset: Secondary | ICD-10-CM | POA: Diagnosis not present

## 2018-08-03 DIAGNOSIS — F0280 Dementia in other diseases classified elsewhere without behavioral disturbance: Secondary | ICD-10-CM | POA: Diagnosis not present

## 2018-08-04 DIAGNOSIS — M47812 Spondylosis without myelopathy or radiculopathy, cervical region: Secondary | ICD-10-CM | POA: Diagnosis not present

## 2018-08-04 DIAGNOSIS — G301 Alzheimer's disease with late onset: Secondary | ICD-10-CM | POA: Diagnosis not present

## 2018-08-04 DIAGNOSIS — F0280 Dementia in other diseases classified elsewhere without behavioral disturbance: Secondary | ICD-10-CM | POA: Diagnosis not present

## 2018-08-04 DIAGNOSIS — R1312 Dysphagia, oropharyngeal phase: Secondary | ICD-10-CM | POA: Diagnosis not present

## 2018-08-08 DIAGNOSIS — F0280 Dementia in other diseases classified elsewhere without behavioral disturbance: Secondary | ICD-10-CM | POA: Diagnosis not present

## 2018-08-08 DIAGNOSIS — M47812 Spondylosis without myelopathy or radiculopathy, cervical region: Secondary | ICD-10-CM | POA: Diagnosis not present

## 2018-08-08 DIAGNOSIS — R1312 Dysphagia, oropharyngeal phase: Secondary | ICD-10-CM | POA: Diagnosis not present

## 2018-08-08 DIAGNOSIS — G301 Alzheimer's disease with late onset: Secondary | ICD-10-CM | POA: Diagnosis not present

## 2018-08-09 ENCOUNTER — Encounter
Admission: RE | Admit: 2018-08-09 | Discharge: 2018-08-09 | Disposition: A | Discharge status: Home / Self Care | Payer: Medicare Other | Source: Ambulatory Visit | Attending: Internal Medicine | Admitting: Internal Medicine

## 2018-08-10 DIAGNOSIS — Z9181 History of falling: Secondary | ICD-10-CM | POA: Diagnosis not present

## 2018-08-10 DIAGNOSIS — R1312 Dysphagia, oropharyngeal phase: Secondary | ICD-10-CM | POA: Diagnosis not present

## 2018-08-10 DIAGNOSIS — F0280 Dementia in other diseases classified elsewhere without behavioral disturbance: Secondary | ICD-10-CM | POA: Diagnosis not present

## 2018-08-10 DIAGNOSIS — G301 Alzheimer's disease with late onset: Secondary | ICD-10-CM | POA: Diagnosis not present

## 2018-08-10 DIAGNOSIS — M6281 Muscle weakness (generalized): Secondary | ICD-10-CM | POA: Diagnosis not present

## 2018-08-10 DIAGNOSIS — H4010X0 Unspecified open-angle glaucoma, stage unspecified: Secondary | ICD-10-CM | POA: Diagnosis not present

## 2018-08-10 DIAGNOSIS — R2689 Other abnormalities of gait and mobility: Secondary | ICD-10-CM | POA: Diagnosis not present

## 2018-08-10 DIAGNOSIS — M47812 Spondylosis without myelopathy or radiculopathy, cervical region: Secondary | ICD-10-CM | POA: Diagnosis not present

## 2018-08-15 DIAGNOSIS — H4010X0 Unspecified open-angle glaucoma, stage unspecified: Secondary | ICD-10-CM | POA: Diagnosis not present

## 2018-08-15 DIAGNOSIS — F0280 Dementia in other diseases classified elsewhere without behavioral disturbance: Secondary | ICD-10-CM | POA: Diagnosis not present

## 2018-08-15 DIAGNOSIS — R2689 Other abnormalities of gait and mobility: Secondary | ICD-10-CM | POA: Diagnosis not present

## 2018-08-15 DIAGNOSIS — R1312 Dysphagia, oropharyngeal phase: Secondary | ICD-10-CM | POA: Diagnosis not present

## 2018-08-15 DIAGNOSIS — G301 Alzheimer's disease with late onset: Secondary | ICD-10-CM | POA: Diagnosis not present

## 2018-08-15 DIAGNOSIS — M6281 Muscle weakness (generalized): Secondary | ICD-10-CM | POA: Diagnosis not present

## 2018-08-18 DIAGNOSIS — F0280 Dementia in other diseases classified elsewhere without behavioral disturbance: Secondary | ICD-10-CM | POA: Diagnosis not present

## 2018-08-18 DIAGNOSIS — M6281 Muscle weakness (generalized): Secondary | ICD-10-CM | POA: Diagnosis not present

## 2018-08-18 DIAGNOSIS — R1312 Dysphagia, oropharyngeal phase: Secondary | ICD-10-CM | POA: Diagnosis not present

## 2018-08-18 DIAGNOSIS — R2689 Other abnormalities of gait and mobility: Secondary | ICD-10-CM | POA: Diagnosis not present

## 2018-08-18 DIAGNOSIS — G301 Alzheimer's disease with late onset: Secondary | ICD-10-CM | POA: Diagnosis not present

## 2018-08-18 DIAGNOSIS — H4010X0 Unspecified open-angle glaucoma, stage unspecified: Secondary | ICD-10-CM | POA: Diagnosis not present

## 2018-08-21 DIAGNOSIS — M6281 Muscle weakness (generalized): Secondary | ICD-10-CM | POA: Diagnosis not present

## 2018-08-21 DIAGNOSIS — R2689 Other abnormalities of gait and mobility: Secondary | ICD-10-CM | POA: Diagnosis not present

## 2018-08-21 DIAGNOSIS — G301 Alzheimer's disease with late onset: Secondary | ICD-10-CM | POA: Diagnosis not present

## 2018-08-21 DIAGNOSIS — R1312 Dysphagia, oropharyngeal phase: Secondary | ICD-10-CM | POA: Diagnosis not present

## 2018-08-21 DIAGNOSIS — F0280 Dementia in other diseases classified elsewhere without behavioral disturbance: Secondary | ICD-10-CM | POA: Diagnosis not present

## 2018-08-21 DIAGNOSIS — H4010X0 Unspecified open-angle glaucoma, stage unspecified: Secondary | ICD-10-CM | POA: Diagnosis not present

## 2018-08-22 DIAGNOSIS — R1312 Dysphagia, oropharyngeal phase: Secondary | ICD-10-CM | POA: Diagnosis not present

## 2018-08-22 DIAGNOSIS — G301 Alzheimer's disease with late onset: Secondary | ICD-10-CM | POA: Diagnosis not present

## 2018-08-22 DIAGNOSIS — M6281 Muscle weakness (generalized): Secondary | ICD-10-CM | POA: Diagnosis not present

## 2018-08-22 DIAGNOSIS — F0280 Dementia in other diseases classified elsewhere without behavioral disturbance: Secondary | ICD-10-CM | POA: Diagnosis not present

## 2018-08-22 DIAGNOSIS — H4010X0 Unspecified open-angle glaucoma, stage unspecified: Secondary | ICD-10-CM | POA: Diagnosis not present

## 2018-08-22 DIAGNOSIS — R2689 Other abnormalities of gait and mobility: Secondary | ICD-10-CM | POA: Diagnosis not present

## 2018-08-24 DIAGNOSIS — G301 Alzheimer's disease with late onset: Secondary | ICD-10-CM | POA: Diagnosis not present

## 2018-08-24 DIAGNOSIS — F0280 Dementia in other diseases classified elsewhere without behavioral disturbance: Secondary | ICD-10-CM | POA: Diagnosis not present

## 2018-08-24 DIAGNOSIS — H4010X0 Unspecified open-angle glaucoma, stage unspecified: Secondary | ICD-10-CM | POA: Diagnosis not present

## 2018-08-24 DIAGNOSIS — M6281 Muscle weakness (generalized): Secondary | ICD-10-CM | POA: Diagnosis not present

## 2018-08-24 DIAGNOSIS — R1312 Dysphagia, oropharyngeal phase: Secondary | ICD-10-CM | POA: Diagnosis not present

## 2018-08-24 DIAGNOSIS — R2689 Other abnormalities of gait and mobility: Secondary | ICD-10-CM | POA: Diagnosis not present

## 2018-08-28 DIAGNOSIS — G301 Alzheimer's disease with late onset: Secondary | ICD-10-CM | POA: Diagnosis not present

## 2018-08-28 DIAGNOSIS — R2689 Other abnormalities of gait and mobility: Secondary | ICD-10-CM | POA: Diagnosis not present

## 2018-08-28 DIAGNOSIS — R1312 Dysphagia, oropharyngeal phase: Secondary | ICD-10-CM | POA: Diagnosis not present

## 2018-08-28 DIAGNOSIS — H4010X0 Unspecified open-angle glaucoma, stage unspecified: Secondary | ICD-10-CM | POA: Diagnosis not present

## 2018-08-28 DIAGNOSIS — M6281 Muscle weakness (generalized): Secondary | ICD-10-CM | POA: Diagnosis not present

## 2018-08-28 DIAGNOSIS — F0280 Dementia in other diseases classified elsewhere without behavioral disturbance: Secondary | ICD-10-CM | POA: Diagnosis not present

## 2018-08-29 DIAGNOSIS — M6281 Muscle weakness (generalized): Secondary | ICD-10-CM | POA: Diagnosis not present

## 2018-08-29 DIAGNOSIS — R1312 Dysphagia, oropharyngeal phase: Secondary | ICD-10-CM | POA: Diagnosis not present

## 2018-08-29 DIAGNOSIS — F0280 Dementia in other diseases classified elsewhere without behavioral disturbance: Secondary | ICD-10-CM | POA: Diagnosis not present

## 2018-08-29 DIAGNOSIS — H4010X0 Unspecified open-angle glaucoma, stage unspecified: Secondary | ICD-10-CM | POA: Diagnosis not present

## 2018-08-29 DIAGNOSIS — R2689 Other abnormalities of gait and mobility: Secondary | ICD-10-CM | POA: Diagnosis not present

## 2018-08-29 DIAGNOSIS — G301 Alzheimer's disease with late onset: Secondary | ICD-10-CM | POA: Diagnosis not present

## 2018-08-30 DIAGNOSIS — R1312 Dysphagia, oropharyngeal phase: Secondary | ICD-10-CM | POA: Diagnosis not present

## 2018-08-30 DIAGNOSIS — G301 Alzheimer's disease with late onset: Secondary | ICD-10-CM | POA: Diagnosis not present

## 2018-08-30 DIAGNOSIS — R2689 Other abnormalities of gait and mobility: Secondary | ICD-10-CM | POA: Diagnosis not present

## 2018-08-30 DIAGNOSIS — H4010X0 Unspecified open-angle glaucoma, stage unspecified: Secondary | ICD-10-CM | POA: Diagnosis not present

## 2018-08-30 DIAGNOSIS — M6281 Muscle weakness (generalized): Secondary | ICD-10-CM | POA: Diagnosis not present

## 2018-08-30 DIAGNOSIS — F0280 Dementia in other diseases classified elsewhere without behavioral disturbance: Secondary | ICD-10-CM | POA: Diagnosis not present

## 2018-08-31 DIAGNOSIS — G301 Alzheimer's disease with late onset: Secondary | ICD-10-CM | POA: Diagnosis not present

## 2018-08-31 DIAGNOSIS — R1312 Dysphagia, oropharyngeal phase: Secondary | ICD-10-CM | POA: Diagnosis not present

## 2018-08-31 DIAGNOSIS — F0280 Dementia in other diseases classified elsewhere without behavioral disturbance: Secondary | ICD-10-CM | POA: Diagnosis not present

## 2018-08-31 DIAGNOSIS — H4010X0 Unspecified open-angle glaucoma, stage unspecified: Secondary | ICD-10-CM | POA: Diagnosis not present

## 2018-08-31 DIAGNOSIS — R2689 Other abnormalities of gait and mobility: Secondary | ICD-10-CM | POA: Diagnosis not present

## 2018-08-31 DIAGNOSIS — M6281 Muscle weakness (generalized): Secondary | ICD-10-CM | POA: Diagnosis not present

## 2018-09-01 DIAGNOSIS — R2689 Other abnormalities of gait and mobility: Secondary | ICD-10-CM | POA: Diagnosis not present

## 2018-09-01 DIAGNOSIS — H4010X0 Unspecified open-angle glaucoma, stage unspecified: Secondary | ICD-10-CM | POA: Diagnosis not present

## 2018-09-01 DIAGNOSIS — R1312 Dysphagia, oropharyngeal phase: Secondary | ICD-10-CM | POA: Diagnosis not present

## 2018-09-01 DIAGNOSIS — G301 Alzheimer's disease with late onset: Secondary | ICD-10-CM | POA: Diagnosis not present

## 2018-09-01 DIAGNOSIS — M6281 Muscle weakness (generalized): Secondary | ICD-10-CM | POA: Diagnosis not present

## 2018-09-01 DIAGNOSIS — F0280 Dementia in other diseases classified elsewhere without behavioral disturbance: Secondary | ICD-10-CM | POA: Diagnosis not present

## 2018-09-04 DIAGNOSIS — G301 Alzheimer's disease with late onset: Secondary | ICD-10-CM | POA: Diagnosis not present

## 2018-09-04 DIAGNOSIS — H4010X0 Unspecified open-angle glaucoma, stage unspecified: Secondary | ICD-10-CM | POA: Diagnosis not present

## 2018-09-04 DIAGNOSIS — R1312 Dysphagia, oropharyngeal phase: Secondary | ICD-10-CM | POA: Diagnosis not present

## 2018-09-04 DIAGNOSIS — M6281 Muscle weakness (generalized): Secondary | ICD-10-CM | POA: Diagnosis not present

## 2018-09-04 DIAGNOSIS — F0280 Dementia in other diseases classified elsewhere without behavioral disturbance: Secondary | ICD-10-CM | POA: Diagnosis not present

## 2018-09-04 DIAGNOSIS — R2689 Other abnormalities of gait and mobility: Secondary | ICD-10-CM | POA: Diagnosis not present

## 2018-09-05 DIAGNOSIS — G301 Alzheimer's disease with late onset: Secondary | ICD-10-CM | POA: Diagnosis not present

## 2018-09-05 DIAGNOSIS — R1312 Dysphagia, oropharyngeal phase: Secondary | ICD-10-CM | POA: Diagnosis not present

## 2018-09-05 DIAGNOSIS — M6281 Muscle weakness (generalized): Secondary | ICD-10-CM | POA: Diagnosis not present

## 2018-09-05 DIAGNOSIS — F0280 Dementia in other diseases classified elsewhere without behavioral disturbance: Secondary | ICD-10-CM | POA: Diagnosis not present

## 2018-09-05 DIAGNOSIS — R2689 Other abnormalities of gait and mobility: Secondary | ICD-10-CM | POA: Diagnosis not present

## 2018-09-05 DIAGNOSIS — H4010X0 Unspecified open-angle glaucoma, stage unspecified: Secondary | ICD-10-CM | POA: Diagnosis not present

## 2018-09-06 DIAGNOSIS — R2689 Other abnormalities of gait and mobility: Secondary | ICD-10-CM | POA: Diagnosis not present

## 2018-09-06 DIAGNOSIS — R1312 Dysphagia, oropharyngeal phase: Secondary | ICD-10-CM | POA: Diagnosis not present

## 2018-09-06 DIAGNOSIS — G301 Alzheimer's disease with late onset: Secondary | ICD-10-CM | POA: Diagnosis not present

## 2018-09-06 DIAGNOSIS — F0280 Dementia in other diseases classified elsewhere without behavioral disturbance: Secondary | ICD-10-CM | POA: Diagnosis not present

## 2018-09-06 DIAGNOSIS — M6281 Muscle weakness (generalized): Secondary | ICD-10-CM | POA: Diagnosis not present

## 2018-09-06 DIAGNOSIS — H4010X0 Unspecified open-angle glaucoma, stage unspecified: Secondary | ICD-10-CM | POA: Diagnosis not present

## 2018-09-07 ENCOUNTER — Other Ambulatory Visit: Payer: Self-pay

## 2018-09-07 DIAGNOSIS — G301 Alzheimer's disease with late onset: Secondary | ICD-10-CM | POA: Diagnosis not present

## 2018-09-07 DIAGNOSIS — R2689 Other abnormalities of gait and mobility: Secondary | ICD-10-CM | POA: Diagnosis not present

## 2018-09-07 DIAGNOSIS — R1312 Dysphagia, oropharyngeal phase: Secondary | ICD-10-CM | POA: Diagnosis not present

## 2018-09-07 DIAGNOSIS — H4010X0 Unspecified open-angle glaucoma, stage unspecified: Secondary | ICD-10-CM | POA: Diagnosis not present

## 2018-09-07 DIAGNOSIS — M6281 Muscle weakness (generalized): Secondary | ICD-10-CM | POA: Diagnosis not present

## 2018-09-07 DIAGNOSIS — F0280 Dementia in other diseases classified elsewhere without behavioral disturbance: Secondary | ICD-10-CM | POA: Diagnosis not present

## 2018-09-08 DIAGNOSIS — R2689 Other abnormalities of gait and mobility: Secondary | ICD-10-CM | POA: Diagnosis not present

## 2018-09-08 DIAGNOSIS — M6281 Muscle weakness (generalized): Secondary | ICD-10-CM | POA: Diagnosis not present

## 2018-09-08 DIAGNOSIS — R1312 Dysphagia, oropharyngeal phase: Secondary | ICD-10-CM | POA: Diagnosis not present

## 2018-09-08 DIAGNOSIS — G301 Alzheimer's disease with late onset: Secondary | ICD-10-CM | POA: Diagnosis not present

## 2018-09-08 DIAGNOSIS — F0280 Dementia in other diseases classified elsewhere without behavioral disturbance: Secondary | ICD-10-CM | POA: Diagnosis not present

## 2018-09-08 DIAGNOSIS — H4010X0 Unspecified open-angle glaucoma, stage unspecified: Secondary | ICD-10-CM | POA: Diagnosis not present

## 2018-09-09 ENCOUNTER — Encounter
Admission: RE | Admit: 2018-09-09 | Discharge: 2018-09-09 | Disposition: A | Discharge status: Home / Self Care | Payer: Medicare Other | Source: Ambulatory Visit | Attending: Internal Medicine | Admitting: Internal Medicine

## 2018-09-11 DIAGNOSIS — M47812 Spondylosis without myelopathy or radiculopathy, cervical region: Secondary | ICD-10-CM | POA: Diagnosis not present

## 2018-09-11 DIAGNOSIS — G301 Alzheimer's disease with late onset: Secondary | ICD-10-CM | POA: Diagnosis not present

## 2018-09-11 DIAGNOSIS — R1312 Dysphagia, oropharyngeal phase: Secondary | ICD-10-CM | POA: Diagnosis not present

## 2018-09-11 DIAGNOSIS — Z9181 History of falling: Secondary | ICD-10-CM | POA: Diagnosis not present

## 2018-09-11 DIAGNOSIS — H4010X0 Unspecified open-angle glaucoma, stage unspecified: Secondary | ICD-10-CM | POA: Diagnosis not present

## 2018-09-11 DIAGNOSIS — M6281 Muscle weakness (generalized): Secondary | ICD-10-CM | POA: Diagnosis not present

## 2018-09-11 DIAGNOSIS — R2689 Other abnormalities of gait and mobility: Secondary | ICD-10-CM | POA: Diagnosis not present

## 2018-09-11 DIAGNOSIS — F0280 Dementia in other diseases classified elsewhere without behavioral disturbance: Secondary | ICD-10-CM | POA: Diagnosis not present

## 2018-09-12 DIAGNOSIS — M6281 Muscle weakness (generalized): Secondary | ICD-10-CM | POA: Diagnosis not present

## 2018-09-12 DIAGNOSIS — G301 Alzheimer's disease with late onset: Secondary | ICD-10-CM | POA: Diagnosis not present

## 2018-09-12 DIAGNOSIS — H4010X0 Unspecified open-angle glaucoma, stage unspecified: Secondary | ICD-10-CM | POA: Diagnosis not present

## 2018-09-12 DIAGNOSIS — F0280 Dementia in other diseases classified elsewhere without behavioral disturbance: Secondary | ICD-10-CM | POA: Diagnosis not present

## 2018-09-12 DIAGNOSIS — R1312 Dysphagia, oropharyngeal phase: Secondary | ICD-10-CM | POA: Diagnosis not present

## 2018-09-12 DIAGNOSIS — R2689 Other abnormalities of gait and mobility: Secondary | ICD-10-CM | POA: Diagnosis not present

## 2018-09-14 DIAGNOSIS — R2689 Other abnormalities of gait and mobility: Secondary | ICD-10-CM | POA: Diagnosis not present

## 2018-09-14 DIAGNOSIS — G301 Alzheimer's disease with late onset: Secondary | ICD-10-CM | POA: Diagnosis not present

## 2018-09-14 DIAGNOSIS — F0280 Dementia in other diseases classified elsewhere without behavioral disturbance: Secondary | ICD-10-CM | POA: Diagnosis not present

## 2018-09-14 DIAGNOSIS — R1312 Dysphagia, oropharyngeal phase: Secondary | ICD-10-CM | POA: Diagnosis not present

## 2018-09-14 DIAGNOSIS — M6281 Muscle weakness (generalized): Secondary | ICD-10-CM | POA: Diagnosis not present

## 2018-09-14 DIAGNOSIS — H4010X0 Unspecified open-angle glaucoma, stage unspecified: Secondary | ICD-10-CM | POA: Diagnosis not present

## 2018-09-15 DIAGNOSIS — R2689 Other abnormalities of gait and mobility: Secondary | ICD-10-CM | POA: Diagnosis not present

## 2018-09-15 DIAGNOSIS — R1312 Dysphagia, oropharyngeal phase: Secondary | ICD-10-CM | POA: Diagnosis not present

## 2018-09-15 DIAGNOSIS — H4010X0 Unspecified open-angle glaucoma, stage unspecified: Secondary | ICD-10-CM | POA: Diagnosis not present

## 2018-09-15 DIAGNOSIS — M6281 Muscle weakness (generalized): Secondary | ICD-10-CM | POA: Diagnosis not present

## 2018-09-15 DIAGNOSIS — G301 Alzheimer's disease with late onset: Secondary | ICD-10-CM | POA: Diagnosis not present

## 2018-09-15 DIAGNOSIS — F0280 Dementia in other diseases classified elsewhere without behavioral disturbance: Secondary | ICD-10-CM | POA: Diagnosis not present

## 2018-09-18 DIAGNOSIS — G301 Alzheimer's disease with late onset: Secondary | ICD-10-CM | POA: Diagnosis not present

## 2018-09-18 DIAGNOSIS — M6281 Muscle weakness (generalized): Secondary | ICD-10-CM | POA: Diagnosis not present

## 2018-09-18 DIAGNOSIS — H4010X0 Unspecified open-angle glaucoma, stage unspecified: Secondary | ICD-10-CM | POA: Diagnosis not present

## 2018-09-18 DIAGNOSIS — R1312 Dysphagia, oropharyngeal phase: Secondary | ICD-10-CM | POA: Diagnosis not present

## 2018-09-18 DIAGNOSIS — F0280 Dementia in other diseases classified elsewhere without behavioral disturbance: Secondary | ICD-10-CM | POA: Diagnosis not present

## 2018-09-18 DIAGNOSIS — R2689 Other abnormalities of gait and mobility: Secondary | ICD-10-CM | POA: Diagnosis not present

## 2018-09-19 DIAGNOSIS — R1312 Dysphagia, oropharyngeal phase: Secondary | ICD-10-CM | POA: Diagnosis not present

## 2018-09-19 DIAGNOSIS — R2689 Other abnormalities of gait and mobility: Secondary | ICD-10-CM | POA: Diagnosis not present

## 2018-09-19 DIAGNOSIS — G301 Alzheimer's disease with late onset: Secondary | ICD-10-CM | POA: Diagnosis not present

## 2018-09-19 DIAGNOSIS — H4010X0 Unspecified open-angle glaucoma, stage unspecified: Secondary | ICD-10-CM | POA: Diagnosis not present

## 2018-09-19 DIAGNOSIS — M6281 Muscle weakness (generalized): Secondary | ICD-10-CM | POA: Diagnosis not present

## 2018-09-19 DIAGNOSIS — F0280 Dementia in other diseases classified elsewhere without behavioral disturbance: Secondary | ICD-10-CM | POA: Diagnosis not present

## 2018-09-20 DIAGNOSIS — R1312 Dysphagia, oropharyngeal phase: Secondary | ICD-10-CM | POA: Diagnosis not present

## 2018-09-20 DIAGNOSIS — R2689 Other abnormalities of gait and mobility: Secondary | ICD-10-CM | POA: Diagnosis not present

## 2018-09-20 DIAGNOSIS — F0280 Dementia in other diseases classified elsewhere without behavioral disturbance: Secondary | ICD-10-CM | POA: Diagnosis not present

## 2018-09-20 DIAGNOSIS — G301 Alzheimer's disease with late onset: Secondary | ICD-10-CM | POA: Diagnosis not present

## 2018-09-20 DIAGNOSIS — H4010X0 Unspecified open-angle glaucoma, stage unspecified: Secondary | ICD-10-CM | POA: Diagnosis not present

## 2018-09-20 DIAGNOSIS — M6281 Muscle weakness (generalized): Secondary | ICD-10-CM | POA: Diagnosis not present

## 2018-09-21 DIAGNOSIS — M6281 Muscle weakness (generalized): Secondary | ICD-10-CM | POA: Diagnosis not present

## 2018-09-21 DIAGNOSIS — R1312 Dysphagia, oropharyngeal phase: Secondary | ICD-10-CM | POA: Diagnosis not present

## 2018-09-21 DIAGNOSIS — G301 Alzheimer's disease with late onset: Secondary | ICD-10-CM | POA: Diagnosis not present

## 2018-09-21 DIAGNOSIS — R2689 Other abnormalities of gait and mobility: Secondary | ICD-10-CM | POA: Diagnosis not present

## 2018-09-21 DIAGNOSIS — H4010X0 Unspecified open-angle glaucoma, stage unspecified: Secondary | ICD-10-CM | POA: Diagnosis not present

## 2018-09-21 DIAGNOSIS — F0280 Dementia in other diseases classified elsewhere without behavioral disturbance: Secondary | ICD-10-CM | POA: Diagnosis not present

## 2018-09-26 DIAGNOSIS — R2689 Other abnormalities of gait and mobility: Secondary | ICD-10-CM | POA: Diagnosis not present

## 2018-09-26 DIAGNOSIS — H4010X0 Unspecified open-angle glaucoma, stage unspecified: Secondary | ICD-10-CM | POA: Diagnosis not present

## 2018-09-26 DIAGNOSIS — R1312 Dysphagia, oropharyngeal phase: Secondary | ICD-10-CM | POA: Diagnosis not present

## 2018-09-26 DIAGNOSIS — G301 Alzheimer's disease with late onset: Secondary | ICD-10-CM | POA: Diagnosis not present

## 2018-09-26 DIAGNOSIS — F0280 Dementia in other diseases classified elsewhere without behavioral disturbance: Secondary | ICD-10-CM | POA: Diagnosis not present

## 2018-09-26 DIAGNOSIS — M6281 Muscle weakness (generalized): Secondary | ICD-10-CM | POA: Diagnosis not present

## 2018-09-27 DIAGNOSIS — R2689 Other abnormalities of gait and mobility: Secondary | ICD-10-CM | POA: Diagnosis not present

## 2018-09-27 DIAGNOSIS — G301 Alzheimer's disease with late onset: Secondary | ICD-10-CM | POA: Diagnosis not present

## 2018-09-27 DIAGNOSIS — R1312 Dysphagia, oropharyngeal phase: Secondary | ICD-10-CM | POA: Diagnosis not present

## 2018-09-27 DIAGNOSIS — H4010X0 Unspecified open-angle glaucoma, stage unspecified: Secondary | ICD-10-CM | POA: Diagnosis not present

## 2018-09-27 DIAGNOSIS — F0280 Dementia in other diseases classified elsewhere without behavioral disturbance: Secondary | ICD-10-CM | POA: Diagnosis not present

## 2018-09-27 DIAGNOSIS — M6281 Muscle weakness (generalized): Secondary | ICD-10-CM | POA: Diagnosis not present

## 2018-09-28 DIAGNOSIS — F0280 Dementia in other diseases classified elsewhere without behavioral disturbance: Secondary | ICD-10-CM | POA: Diagnosis not present

## 2018-09-28 DIAGNOSIS — H4010X0 Unspecified open-angle glaucoma, stage unspecified: Secondary | ICD-10-CM | POA: Diagnosis not present

## 2018-09-28 DIAGNOSIS — R2689 Other abnormalities of gait and mobility: Secondary | ICD-10-CM | POA: Diagnosis not present

## 2018-09-28 DIAGNOSIS — R1312 Dysphagia, oropharyngeal phase: Secondary | ICD-10-CM | POA: Diagnosis not present

## 2018-09-28 DIAGNOSIS — G301 Alzheimer's disease with late onset: Secondary | ICD-10-CM | POA: Diagnosis not present

## 2018-09-28 DIAGNOSIS — M6281 Muscle weakness (generalized): Secondary | ICD-10-CM | POA: Diagnosis not present

## 2018-10-02 DIAGNOSIS — R2689 Other abnormalities of gait and mobility: Secondary | ICD-10-CM | POA: Diagnosis not present

## 2018-10-02 DIAGNOSIS — G301 Alzheimer's disease with late onset: Secondary | ICD-10-CM | POA: Diagnosis not present

## 2018-10-02 DIAGNOSIS — M6281 Muscle weakness (generalized): Secondary | ICD-10-CM | POA: Diagnosis not present

## 2018-10-02 DIAGNOSIS — F0280 Dementia in other diseases classified elsewhere without behavioral disturbance: Secondary | ICD-10-CM | POA: Diagnosis not present

## 2018-10-02 DIAGNOSIS — R1312 Dysphagia, oropharyngeal phase: Secondary | ICD-10-CM | POA: Diagnosis not present

## 2018-10-02 DIAGNOSIS — H4010X0 Unspecified open-angle glaucoma, stage unspecified: Secondary | ICD-10-CM | POA: Diagnosis not present

## 2018-10-03 DIAGNOSIS — G301 Alzheimer's disease with late onset: Secondary | ICD-10-CM | POA: Diagnosis not present

## 2018-10-03 DIAGNOSIS — M6281 Muscle weakness (generalized): Secondary | ICD-10-CM | POA: Diagnosis not present

## 2018-10-03 DIAGNOSIS — H4010X0 Unspecified open-angle glaucoma, stage unspecified: Secondary | ICD-10-CM | POA: Diagnosis not present

## 2018-10-03 DIAGNOSIS — R2689 Other abnormalities of gait and mobility: Secondary | ICD-10-CM | POA: Diagnosis not present

## 2018-10-03 DIAGNOSIS — F0280 Dementia in other diseases classified elsewhere without behavioral disturbance: Secondary | ICD-10-CM | POA: Diagnosis not present

## 2018-10-03 DIAGNOSIS — R1312 Dysphagia, oropharyngeal phase: Secondary | ICD-10-CM | POA: Diagnosis not present

## 2018-10-04 DIAGNOSIS — F0280 Dementia in other diseases classified elsewhere without behavioral disturbance: Secondary | ICD-10-CM | POA: Diagnosis not present

## 2018-10-04 DIAGNOSIS — M6281 Muscle weakness (generalized): Secondary | ICD-10-CM | POA: Diagnosis not present

## 2018-10-04 DIAGNOSIS — G301 Alzheimer's disease with late onset: Secondary | ICD-10-CM | POA: Diagnosis not present

## 2018-10-04 DIAGNOSIS — R2689 Other abnormalities of gait and mobility: Secondary | ICD-10-CM | POA: Diagnosis not present

## 2018-10-04 DIAGNOSIS — H4010X0 Unspecified open-angle glaucoma, stage unspecified: Secondary | ICD-10-CM | POA: Diagnosis not present

## 2018-10-04 DIAGNOSIS — R1312 Dysphagia, oropharyngeal phase: Secondary | ICD-10-CM | POA: Diagnosis not present

## 2018-10-05 DIAGNOSIS — H4010X0 Unspecified open-angle glaucoma, stage unspecified: Secondary | ICD-10-CM | POA: Diagnosis not present

## 2018-10-05 DIAGNOSIS — R2689 Other abnormalities of gait and mobility: Secondary | ICD-10-CM | POA: Diagnosis not present

## 2018-10-05 DIAGNOSIS — M6281 Muscle weakness (generalized): Secondary | ICD-10-CM | POA: Diagnosis not present

## 2018-10-05 DIAGNOSIS — R1312 Dysphagia, oropharyngeal phase: Secondary | ICD-10-CM | POA: Diagnosis not present

## 2018-10-05 DIAGNOSIS — G301 Alzheimer's disease with late onset: Secondary | ICD-10-CM | POA: Diagnosis not present

## 2018-10-05 DIAGNOSIS — F0280 Dementia in other diseases classified elsewhere without behavioral disturbance: Secondary | ICD-10-CM | POA: Diagnosis not present

## 2018-10-06 DIAGNOSIS — R2689 Other abnormalities of gait and mobility: Secondary | ICD-10-CM | POA: Diagnosis not present

## 2018-10-06 DIAGNOSIS — M6281 Muscle weakness (generalized): Secondary | ICD-10-CM | POA: Diagnosis not present

## 2018-10-06 DIAGNOSIS — F0280 Dementia in other diseases classified elsewhere without behavioral disturbance: Secondary | ICD-10-CM | POA: Diagnosis not present

## 2018-10-06 DIAGNOSIS — G301 Alzheimer's disease with late onset: Secondary | ICD-10-CM | POA: Diagnosis not present

## 2018-10-06 DIAGNOSIS — H4010X0 Unspecified open-angle glaucoma, stage unspecified: Secondary | ICD-10-CM | POA: Diagnosis not present

## 2018-10-06 DIAGNOSIS — R1312 Dysphagia, oropharyngeal phase: Secondary | ICD-10-CM | POA: Diagnosis not present

## 2018-10-09 DIAGNOSIS — R1312 Dysphagia, oropharyngeal phase: Secondary | ICD-10-CM | POA: Diagnosis not present

## 2018-10-09 DIAGNOSIS — G301 Alzheimer's disease with late onset: Secondary | ICD-10-CM | POA: Diagnosis not present

## 2018-10-09 DIAGNOSIS — H4010X0 Unspecified open-angle glaucoma, stage unspecified: Secondary | ICD-10-CM | POA: Diagnosis not present

## 2018-10-09 DIAGNOSIS — F0280 Dementia in other diseases classified elsewhere without behavioral disturbance: Secondary | ICD-10-CM | POA: Diagnosis not present

## 2018-10-09 DIAGNOSIS — M6281 Muscle weakness (generalized): Secondary | ICD-10-CM | POA: Diagnosis not present

## 2018-10-09 DIAGNOSIS — R2689 Other abnormalities of gait and mobility: Secondary | ICD-10-CM | POA: Diagnosis not present

## 2018-10-10 ENCOUNTER — Encounter
Admission: RE | Admit: 2018-10-10 | Discharge: 2018-10-10 | Disposition: A | Discharge status: Home / Self Care | Payer: Medicare Other | Source: Ambulatory Visit | Attending: Internal Medicine | Admitting: Internal Medicine

## 2018-10-11 ENCOUNTER — Other Ambulatory Visit
Admission: RE | Admit: 2018-10-11 | Discharge: 2018-10-11 | Disposition: A | Discharge status: Home / Self Care | Payer: Medicare Other | Source: Skilled Nursing Facility | Attending: Internal Medicine | Admitting: Internal Medicine

## 2018-10-11 DIAGNOSIS — H4010X0 Unspecified open-angle glaucoma, stage unspecified: Secondary | ICD-10-CM | POA: Diagnosis not present

## 2018-10-11 DIAGNOSIS — R2689 Other abnormalities of gait and mobility: Secondary | ICD-10-CM | POA: Diagnosis not present

## 2018-10-11 DIAGNOSIS — Z9181 History of falling: Secondary | ICD-10-CM | POA: Diagnosis not present

## 2018-10-11 DIAGNOSIS — M47812 Spondylosis without myelopathy or radiculopathy, cervical region: Secondary | ICD-10-CM | POA: Diagnosis not present

## 2018-10-11 DIAGNOSIS — M6281 Muscle weakness (generalized): Secondary | ICD-10-CM | POA: Diagnosis not present

## 2018-10-11 DIAGNOSIS — F0280 Dementia in other diseases classified elsewhere without behavioral disturbance: Secondary | ICD-10-CM | POA: Diagnosis not present

## 2018-10-11 DIAGNOSIS — R1312 Dysphagia, oropharyngeal phase: Secondary | ICD-10-CM | POA: Diagnosis not present

## 2018-10-11 DIAGNOSIS — R358 Other polyuria: Secondary | ICD-10-CM | POA: Diagnosis not present

## 2018-10-11 DIAGNOSIS — G301 Alzheimer's disease with late onset: Secondary | ICD-10-CM | POA: Diagnosis not present

## 2018-10-11 LAB — URINALYSIS, ROUTINE W REFLEX MICROSCOPIC
Bilirubin Urine: NEGATIVE
Glucose, UA: NEGATIVE mg/dL
Hgb urine dipstick: NEGATIVE
Ketones, ur: NEGATIVE mg/dL
Leukocytes,Ua: NEGATIVE
Nitrite: NEGATIVE
Protein, ur: NEGATIVE mg/dL
Specific Gravity, Urine: 1.021 (ref 1.005–1.030)
pH: 5 (ref 5.0–8.0)

## 2018-10-12 DIAGNOSIS — M6281 Muscle weakness (generalized): Secondary | ICD-10-CM | POA: Diagnosis not present

## 2018-10-12 DIAGNOSIS — G301 Alzheimer's disease with late onset: Secondary | ICD-10-CM | POA: Diagnosis not present

## 2018-10-12 DIAGNOSIS — H4010X0 Unspecified open-angle glaucoma, stage unspecified: Secondary | ICD-10-CM | POA: Diagnosis not present

## 2018-10-12 DIAGNOSIS — F0280 Dementia in other diseases classified elsewhere without behavioral disturbance: Secondary | ICD-10-CM | POA: Diagnosis not present

## 2018-10-12 DIAGNOSIS — R1312 Dysphagia, oropharyngeal phase: Secondary | ICD-10-CM | POA: Diagnosis not present

## 2018-10-12 DIAGNOSIS — R2689 Other abnormalities of gait and mobility: Secondary | ICD-10-CM | POA: Diagnosis not present

## 2018-10-12 LAB — URINE CULTURE: Culture: NO GROWTH

## 2018-10-13 DIAGNOSIS — G301 Alzheimer's disease with late onset: Secondary | ICD-10-CM | POA: Diagnosis not present

## 2018-10-13 DIAGNOSIS — R1312 Dysphagia, oropharyngeal phase: Secondary | ICD-10-CM | POA: Diagnosis not present

## 2018-10-13 DIAGNOSIS — F0280 Dementia in other diseases classified elsewhere without behavioral disturbance: Secondary | ICD-10-CM | POA: Diagnosis not present

## 2018-10-13 DIAGNOSIS — R2689 Other abnormalities of gait and mobility: Secondary | ICD-10-CM | POA: Diagnosis not present

## 2018-10-13 DIAGNOSIS — M6281 Muscle weakness (generalized): Secondary | ICD-10-CM | POA: Diagnosis not present

## 2018-10-13 DIAGNOSIS — H4010X0 Unspecified open-angle glaucoma, stage unspecified: Secondary | ICD-10-CM | POA: Diagnosis not present

## 2018-10-16 DIAGNOSIS — G301 Alzheimer's disease with late onset: Secondary | ICD-10-CM | POA: Diagnosis not present

## 2018-10-16 DIAGNOSIS — F0280 Dementia in other diseases classified elsewhere without behavioral disturbance: Secondary | ICD-10-CM | POA: Diagnosis not present

## 2018-10-16 DIAGNOSIS — M6281 Muscle weakness (generalized): Secondary | ICD-10-CM | POA: Diagnosis not present

## 2018-10-16 DIAGNOSIS — H4010X0 Unspecified open-angle glaucoma, stage unspecified: Secondary | ICD-10-CM | POA: Diagnosis not present

## 2018-10-16 DIAGNOSIS — R1312 Dysphagia, oropharyngeal phase: Secondary | ICD-10-CM | POA: Diagnosis not present

## 2018-10-16 DIAGNOSIS — R2689 Other abnormalities of gait and mobility: Secondary | ICD-10-CM | POA: Diagnosis not present

## 2018-10-17 DIAGNOSIS — R2689 Other abnormalities of gait and mobility: Secondary | ICD-10-CM | POA: Diagnosis not present

## 2018-10-17 DIAGNOSIS — H4010X0 Unspecified open-angle glaucoma, stage unspecified: Secondary | ICD-10-CM | POA: Diagnosis not present

## 2018-10-17 DIAGNOSIS — F0280 Dementia in other diseases classified elsewhere without behavioral disturbance: Secondary | ICD-10-CM | POA: Diagnosis not present

## 2018-10-17 DIAGNOSIS — R1312 Dysphagia, oropharyngeal phase: Secondary | ICD-10-CM | POA: Diagnosis not present

## 2018-10-17 DIAGNOSIS — M6281 Muscle weakness (generalized): Secondary | ICD-10-CM | POA: Diagnosis not present

## 2018-10-17 DIAGNOSIS — G301 Alzheimer's disease with late onset: Secondary | ICD-10-CM | POA: Diagnosis not present

## 2018-10-18 DIAGNOSIS — H4010X0 Unspecified open-angle glaucoma, stage unspecified: Secondary | ICD-10-CM | POA: Diagnosis not present

## 2018-10-18 DIAGNOSIS — G301 Alzheimer's disease with late onset: Secondary | ICD-10-CM | POA: Diagnosis not present

## 2018-10-18 DIAGNOSIS — R1312 Dysphagia, oropharyngeal phase: Secondary | ICD-10-CM | POA: Diagnosis not present

## 2018-10-18 DIAGNOSIS — F0280 Dementia in other diseases classified elsewhere without behavioral disturbance: Secondary | ICD-10-CM | POA: Diagnosis not present

## 2018-10-18 DIAGNOSIS — R2689 Other abnormalities of gait and mobility: Secondary | ICD-10-CM | POA: Diagnosis not present

## 2018-10-18 DIAGNOSIS — M6281 Muscle weakness (generalized): Secondary | ICD-10-CM | POA: Diagnosis not present

## 2018-10-19 DIAGNOSIS — M6281 Muscle weakness (generalized): Secondary | ICD-10-CM | POA: Diagnosis not present

## 2018-10-19 DIAGNOSIS — H4010X0 Unspecified open-angle glaucoma, stage unspecified: Secondary | ICD-10-CM | POA: Diagnosis not present

## 2018-10-19 DIAGNOSIS — G301 Alzheimer's disease with late onset: Secondary | ICD-10-CM | POA: Diagnosis not present

## 2018-10-19 DIAGNOSIS — F0280 Dementia in other diseases classified elsewhere without behavioral disturbance: Secondary | ICD-10-CM | POA: Diagnosis not present

## 2018-10-19 DIAGNOSIS — R2689 Other abnormalities of gait and mobility: Secondary | ICD-10-CM | POA: Diagnosis not present

## 2018-10-19 DIAGNOSIS — R1312 Dysphagia, oropharyngeal phase: Secondary | ICD-10-CM | POA: Diagnosis not present

## 2018-10-20 DIAGNOSIS — F0280 Dementia in other diseases classified elsewhere without behavioral disturbance: Secondary | ICD-10-CM | POA: Diagnosis not present

## 2018-10-20 DIAGNOSIS — R1312 Dysphagia, oropharyngeal phase: Secondary | ICD-10-CM | POA: Diagnosis not present

## 2018-10-20 DIAGNOSIS — R2689 Other abnormalities of gait and mobility: Secondary | ICD-10-CM | POA: Diagnosis not present

## 2018-10-20 DIAGNOSIS — H4010X0 Unspecified open-angle glaucoma, stage unspecified: Secondary | ICD-10-CM | POA: Diagnosis not present

## 2018-10-20 DIAGNOSIS — M6281 Muscle weakness (generalized): Secondary | ICD-10-CM | POA: Diagnosis not present

## 2018-10-20 DIAGNOSIS — G301 Alzheimer's disease with late onset: Secondary | ICD-10-CM | POA: Diagnosis not present

## 2018-10-23 DIAGNOSIS — G301 Alzheimer's disease with late onset: Secondary | ICD-10-CM | POA: Diagnosis not present

## 2018-10-23 DIAGNOSIS — R2689 Other abnormalities of gait and mobility: Secondary | ICD-10-CM | POA: Diagnosis not present

## 2018-10-23 DIAGNOSIS — R1312 Dysphagia, oropharyngeal phase: Secondary | ICD-10-CM | POA: Diagnosis not present

## 2018-10-23 DIAGNOSIS — H4010X0 Unspecified open-angle glaucoma, stage unspecified: Secondary | ICD-10-CM | POA: Diagnosis not present

## 2018-10-23 DIAGNOSIS — F0280 Dementia in other diseases classified elsewhere without behavioral disturbance: Secondary | ICD-10-CM | POA: Diagnosis not present

## 2018-10-23 DIAGNOSIS — M6281 Muscle weakness (generalized): Secondary | ICD-10-CM | POA: Diagnosis not present

## 2018-10-24 DIAGNOSIS — R1312 Dysphagia, oropharyngeal phase: Secondary | ICD-10-CM | POA: Diagnosis not present

## 2018-10-24 DIAGNOSIS — G301 Alzheimer's disease with late onset: Secondary | ICD-10-CM | POA: Diagnosis not present

## 2018-10-24 DIAGNOSIS — F0280 Dementia in other diseases classified elsewhere without behavioral disturbance: Secondary | ICD-10-CM | POA: Diagnosis not present

## 2018-10-24 DIAGNOSIS — H4010X0 Unspecified open-angle glaucoma, stage unspecified: Secondary | ICD-10-CM | POA: Diagnosis not present

## 2018-10-24 DIAGNOSIS — M6281 Muscle weakness (generalized): Secondary | ICD-10-CM | POA: Diagnosis not present

## 2018-10-24 DIAGNOSIS — R2689 Other abnormalities of gait and mobility: Secondary | ICD-10-CM | POA: Diagnosis not present

## 2018-10-24 DIAGNOSIS — F325 Major depressive disorder, single episode, in full remission: Secondary | ICD-10-CM | POA: Diagnosis not present

## 2018-10-24 DIAGNOSIS — I5032 Chronic diastolic (congestive) heart failure: Secondary | ICD-10-CM | POA: Diagnosis not present

## 2018-10-24 DIAGNOSIS — Z593 Problems related to living in residential institution: Secondary | ICD-10-CM | POA: Diagnosis not present

## 2018-10-26 DIAGNOSIS — R2689 Other abnormalities of gait and mobility: Secondary | ICD-10-CM | POA: Diagnosis not present

## 2018-10-26 DIAGNOSIS — M6281 Muscle weakness (generalized): Secondary | ICD-10-CM | POA: Diagnosis not present

## 2018-10-26 DIAGNOSIS — R1312 Dysphagia, oropharyngeal phase: Secondary | ICD-10-CM | POA: Diagnosis not present

## 2018-10-26 DIAGNOSIS — H4010X0 Unspecified open-angle glaucoma, stage unspecified: Secondary | ICD-10-CM | POA: Diagnosis not present

## 2018-10-26 DIAGNOSIS — G301 Alzheimer's disease with late onset: Secondary | ICD-10-CM | POA: Diagnosis not present

## 2018-10-26 DIAGNOSIS — F0280 Dementia in other diseases classified elsewhere without behavioral disturbance: Secondary | ICD-10-CM | POA: Diagnosis not present

## 2018-10-30 DIAGNOSIS — H4010X0 Unspecified open-angle glaucoma, stage unspecified: Secondary | ICD-10-CM | POA: Diagnosis not present

## 2018-10-30 DIAGNOSIS — F0280 Dementia in other diseases classified elsewhere without behavioral disturbance: Secondary | ICD-10-CM | POA: Diagnosis not present

## 2018-10-30 DIAGNOSIS — M6281 Muscle weakness (generalized): Secondary | ICD-10-CM | POA: Diagnosis not present

## 2018-10-30 DIAGNOSIS — R2689 Other abnormalities of gait and mobility: Secondary | ICD-10-CM | POA: Diagnosis not present

## 2018-10-30 DIAGNOSIS — G301 Alzheimer's disease with late onset: Secondary | ICD-10-CM | POA: Diagnosis not present

## 2018-10-30 DIAGNOSIS — R1312 Dysphagia, oropharyngeal phase: Secondary | ICD-10-CM | POA: Diagnosis not present

## 2018-10-31 DIAGNOSIS — F0280 Dementia in other diseases classified elsewhere without behavioral disturbance: Secondary | ICD-10-CM | POA: Diagnosis not present

## 2018-10-31 DIAGNOSIS — R1312 Dysphagia, oropharyngeal phase: Secondary | ICD-10-CM | POA: Diagnosis not present

## 2018-10-31 DIAGNOSIS — M6281 Muscle weakness (generalized): Secondary | ICD-10-CM | POA: Diagnosis not present

## 2018-10-31 DIAGNOSIS — H4010X0 Unspecified open-angle glaucoma, stage unspecified: Secondary | ICD-10-CM | POA: Diagnosis not present

## 2018-10-31 DIAGNOSIS — G301 Alzheimer's disease with late onset: Secondary | ICD-10-CM | POA: Diagnosis not present

## 2018-10-31 DIAGNOSIS — R2689 Other abnormalities of gait and mobility: Secondary | ICD-10-CM | POA: Diagnosis not present

## 2018-11-01 DIAGNOSIS — M6281 Muscle weakness (generalized): Secondary | ICD-10-CM | POA: Diagnosis not present

## 2018-11-01 DIAGNOSIS — F0280 Dementia in other diseases classified elsewhere without behavioral disturbance: Secondary | ICD-10-CM | POA: Diagnosis not present

## 2018-11-01 DIAGNOSIS — R2689 Other abnormalities of gait and mobility: Secondary | ICD-10-CM | POA: Diagnosis not present

## 2018-11-01 DIAGNOSIS — G301 Alzheimer's disease with late onset: Secondary | ICD-10-CM | POA: Diagnosis not present

## 2018-11-01 DIAGNOSIS — R1312 Dysphagia, oropharyngeal phase: Secondary | ICD-10-CM | POA: Diagnosis not present

## 2018-11-01 DIAGNOSIS — H4010X0 Unspecified open-angle glaucoma, stage unspecified: Secondary | ICD-10-CM | POA: Diagnosis not present

## 2018-11-02 DIAGNOSIS — H4010X0 Unspecified open-angle glaucoma, stage unspecified: Secondary | ICD-10-CM | POA: Diagnosis not present

## 2018-11-02 DIAGNOSIS — G301 Alzheimer's disease with late onset: Secondary | ICD-10-CM | POA: Diagnosis not present

## 2018-11-02 DIAGNOSIS — R2689 Other abnormalities of gait and mobility: Secondary | ICD-10-CM | POA: Diagnosis not present

## 2018-11-02 DIAGNOSIS — R1312 Dysphagia, oropharyngeal phase: Secondary | ICD-10-CM | POA: Diagnosis not present

## 2018-11-02 DIAGNOSIS — F0280 Dementia in other diseases classified elsewhere without behavioral disturbance: Secondary | ICD-10-CM | POA: Diagnosis not present

## 2018-11-02 DIAGNOSIS — M6281 Muscle weakness (generalized): Secondary | ICD-10-CM | POA: Diagnosis not present

## 2018-11-06 DIAGNOSIS — R1312 Dysphagia, oropharyngeal phase: Secondary | ICD-10-CM | POA: Diagnosis not present

## 2018-11-06 DIAGNOSIS — H4010X0 Unspecified open-angle glaucoma, stage unspecified: Secondary | ICD-10-CM | POA: Diagnosis not present

## 2018-11-06 DIAGNOSIS — R2689 Other abnormalities of gait and mobility: Secondary | ICD-10-CM | POA: Diagnosis not present

## 2018-11-06 DIAGNOSIS — G301 Alzheimer's disease with late onset: Secondary | ICD-10-CM | POA: Diagnosis not present

## 2018-11-06 DIAGNOSIS — F0280 Dementia in other diseases classified elsewhere without behavioral disturbance: Secondary | ICD-10-CM | POA: Diagnosis not present

## 2018-11-06 DIAGNOSIS — M6281 Muscle weakness (generalized): Secondary | ICD-10-CM | POA: Diagnosis not present

## 2018-11-07 DIAGNOSIS — G301 Alzheimer's disease with late onset: Secondary | ICD-10-CM | POA: Diagnosis not present

## 2018-11-07 DIAGNOSIS — M6281 Muscle weakness (generalized): Secondary | ICD-10-CM | POA: Diagnosis not present

## 2018-11-07 DIAGNOSIS — R1312 Dysphagia, oropharyngeal phase: Secondary | ICD-10-CM | POA: Diagnosis not present

## 2018-11-07 DIAGNOSIS — F0280 Dementia in other diseases classified elsewhere without behavioral disturbance: Secondary | ICD-10-CM | POA: Diagnosis not present

## 2018-11-07 DIAGNOSIS — R2689 Other abnormalities of gait and mobility: Secondary | ICD-10-CM | POA: Diagnosis not present

## 2018-11-07 DIAGNOSIS — H4010X0 Unspecified open-angle glaucoma, stage unspecified: Secondary | ICD-10-CM | POA: Diagnosis not present

## 2018-11-08 DIAGNOSIS — M6281 Muscle weakness (generalized): Secondary | ICD-10-CM | POA: Diagnosis not present

## 2018-11-08 DIAGNOSIS — F0280 Dementia in other diseases classified elsewhere without behavioral disturbance: Secondary | ICD-10-CM | POA: Diagnosis not present

## 2018-11-08 DIAGNOSIS — R1312 Dysphagia, oropharyngeal phase: Secondary | ICD-10-CM | POA: Diagnosis not present

## 2018-11-08 DIAGNOSIS — H4010X0 Unspecified open-angle glaucoma, stage unspecified: Secondary | ICD-10-CM | POA: Diagnosis not present

## 2018-11-08 DIAGNOSIS — G301 Alzheimer's disease with late onset: Secondary | ICD-10-CM | POA: Diagnosis not present

## 2018-11-08 DIAGNOSIS — R2689 Other abnormalities of gait and mobility: Secondary | ICD-10-CM | POA: Diagnosis not present

## 2018-11-10 ENCOUNTER — Encounter
Admission: RE | Admit: 2018-11-10 | Discharge: 2018-11-10 | Disposition: A | Discharge status: Home / Self Care | Payer: Medicare Other | Source: Ambulatory Visit | Attending: Internal Medicine | Admitting: Internal Medicine

## 2018-11-13 DIAGNOSIS — R2689 Other abnormalities of gait and mobility: Secondary | ICD-10-CM | POA: Diagnosis not present

## 2018-11-13 DIAGNOSIS — Z9181 History of falling: Secondary | ICD-10-CM | POA: Diagnosis not present

## 2018-11-13 DIAGNOSIS — G301 Alzheimer's disease with late onset: Secondary | ICD-10-CM | POA: Diagnosis not present

## 2018-11-13 DIAGNOSIS — M47812 Spondylosis without myelopathy or radiculopathy, cervical region: Secondary | ICD-10-CM | POA: Diagnosis not present

## 2018-11-13 DIAGNOSIS — R1312 Dysphagia, oropharyngeal phase: Secondary | ICD-10-CM | POA: Diagnosis not present

## 2018-11-13 DIAGNOSIS — F0280 Dementia in other diseases classified elsewhere without behavioral disturbance: Secondary | ICD-10-CM | POA: Diagnosis not present

## 2018-11-13 DIAGNOSIS — M6281 Muscle weakness (generalized): Secondary | ICD-10-CM | POA: Diagnosis not present

## 2018-11-13 DIAGNOSIS — H4010X0 Unspecified open-angle glaucoma, stage unspecified: Secondary | ICD-10-CM | POA: Diagnosis not present

## 2018-11-15 DIAGNOSIS — R1312 Dysphagia, oropharyngeal phase: Secondary | ICD-10-CM | POA: Diagnosis not present

## 2018-11-15 DIAGNOSIS — G301 Alzheimer's disease with late onset: Secondary | ICD-10-CM | POA: Diagnosis not present

## 2018-11-15 DIAGNOSIS — H4010X0 Unspecified open-angle glaucoma, stage unspecified: Secondary | ICD-10-CM | POA: Diagnosis not present

## 2018-11-15 DIAGNOSIS — R2689 Other abnormalities of gait and mobility: Secondary | ICD-10-CM | POA: Diagnosis not present

## 2018-11-15 DIAGNOSIS — F0280 Dementia in other diseases classified elsewhere without behavioral disturbance: Secondary | ICD-10-CM | POA: Diagnosis not present

## 2018-11-15 DIAGNOSIS — M6281 Muscle weakness (generalized): Secondary | ICD-10-CM | POA: Diagnosis not present

## 2018-12-18 ENCOUNTER — Encounter
Admission: RE | Admit: 2018-12-18 | Discharge: 2018-12-18 | Disposition: A | Discharge status: Home / Self Care | Payer: Medicare Other | Source: Ambulatory Visit | Attending: Internal Medicine | Admitting: Internal Medicine

## 2019-01-15 IMAGING — RF DG SWALLOWING FUNCTION
10 series · 13 of 24 positions shown · non-contrast
Comparison: None.

CLINICAL DATA: Dysphagia

EXAM:
MODIFIED BARIUM SWALLOW
TECHNIQUE: Different consistencies of barium were administered orally to the
patient by the Speech Pathologist. Imaging of the pharynx was
performed in the lateral projection.
FLUOROSCOPY TIME:  Fluoroscopy Time:  1.2 minutes
Radiation Exposure Index (if provided by the fluoroscopic device):
1.5 mGy
Number of Acquired Spot Images: 0

[Series 1: run · 1 of 19 frames shown (1 of 10)]
[frame 3/19]
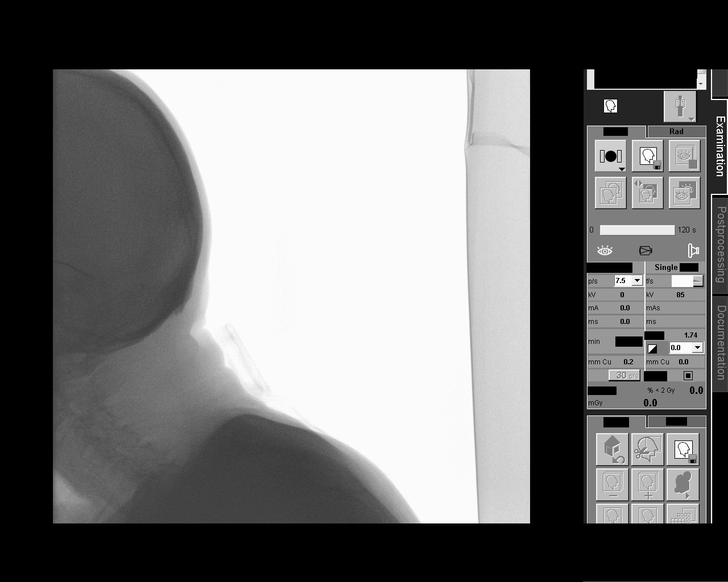

[Series 2: run · 2 of 19 frames shown (2 of 10)]
[frame 3/19]
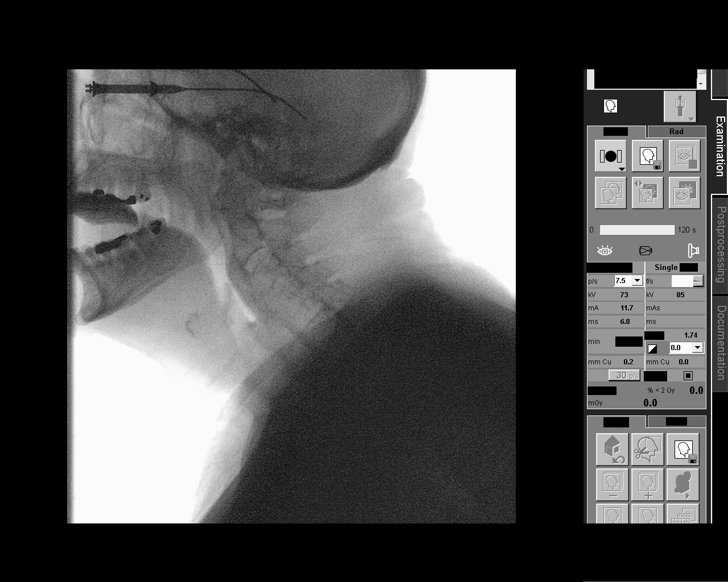
[frame 17/19]
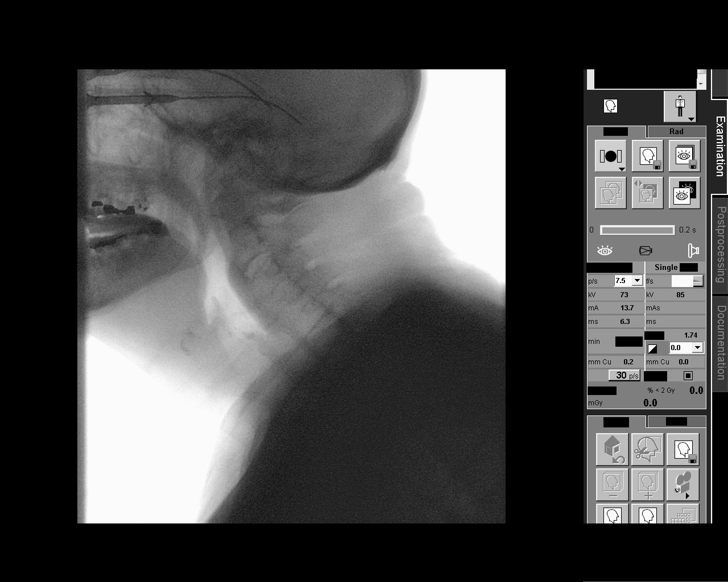

[Series 3: run · 1 of 121 frames shown (3 of 10)]
[frame 103/121]
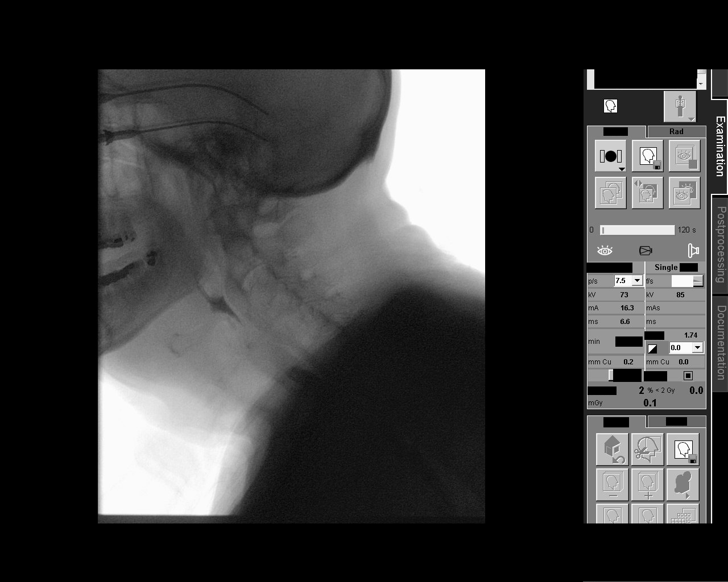

[Series 4: run · 1 of 171 frames shown (4 of 10)]
[frame 86/171]
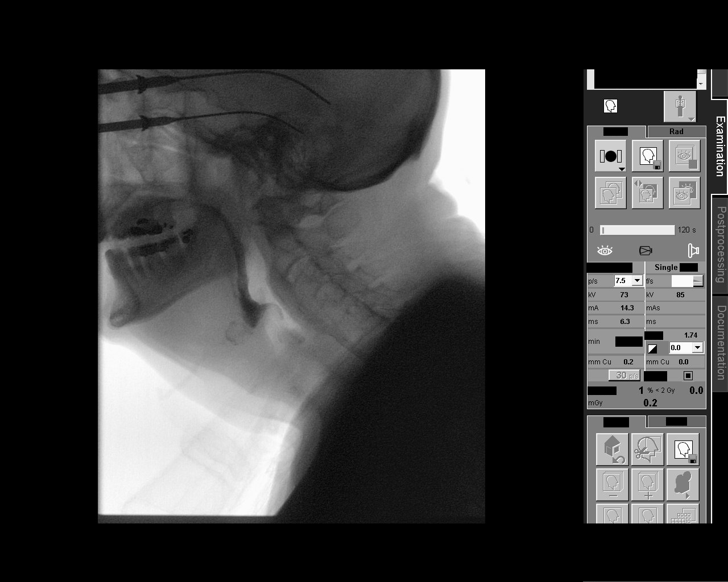

[Series 5: run · 1 of 671 frames shown (5 of 10)]
[frame 101/671]
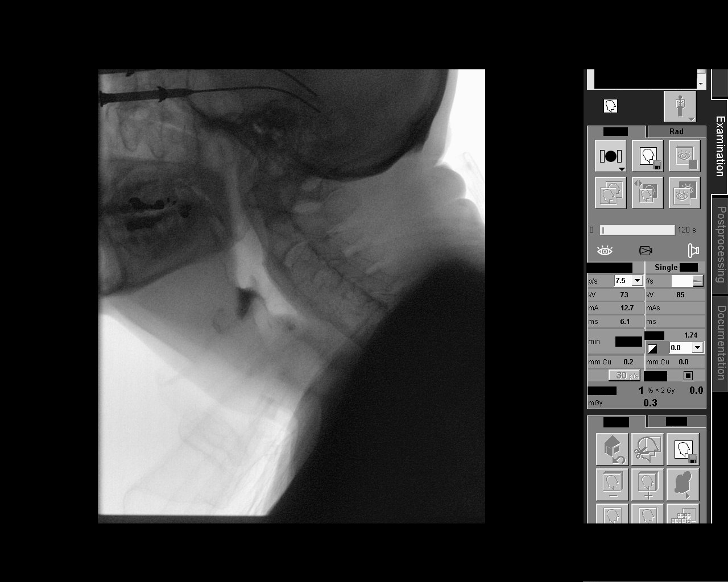

[Series 6: run · 2 of 29 frames shown (6 of 10)]
[frame 5/29]
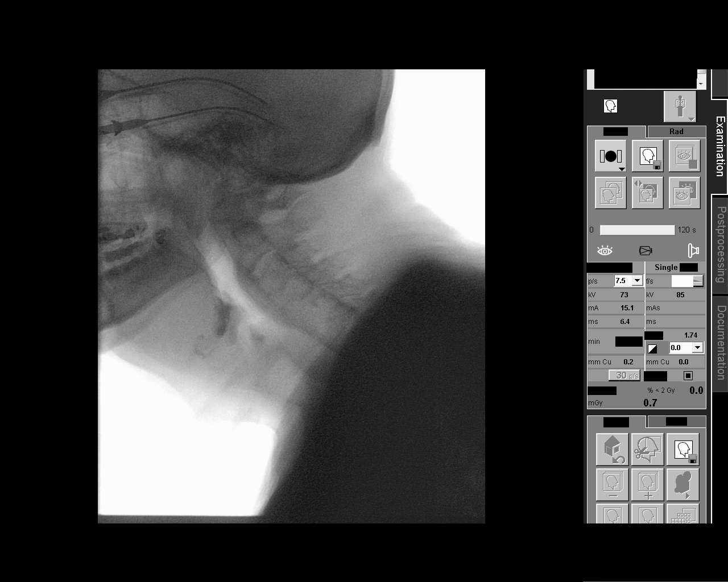
[frame 25/29]
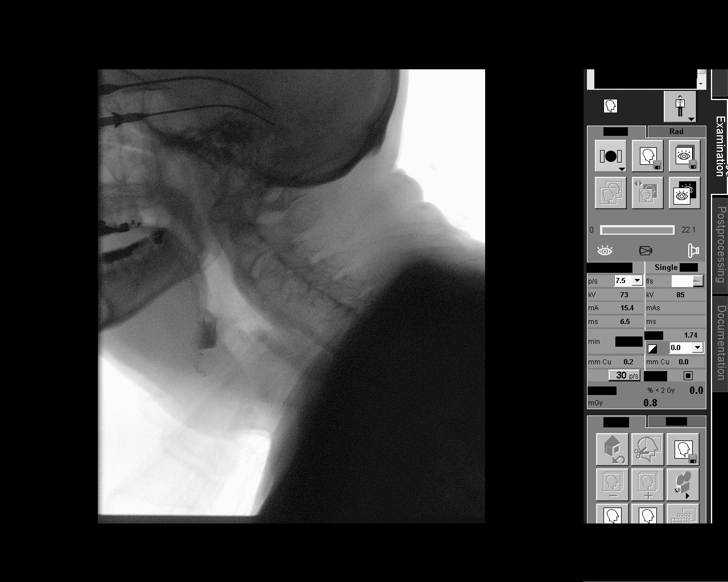

[Series 7: run · 1 of 290 frames shown (7 of 10)]
[frame 146/290]
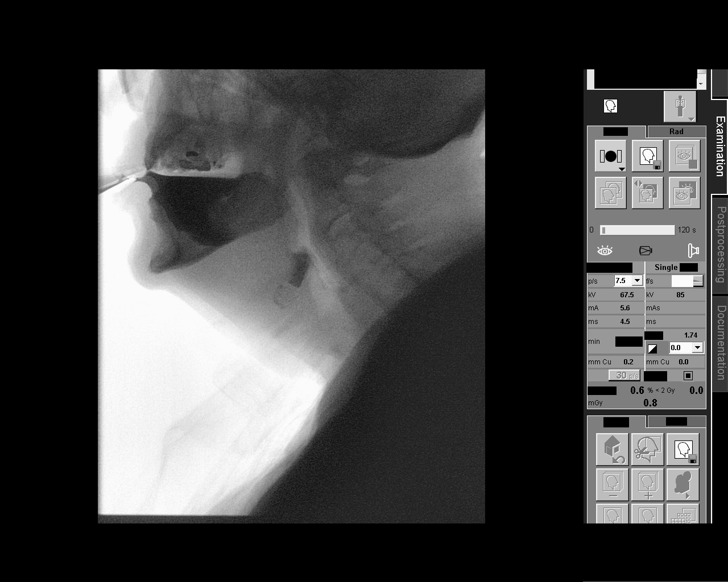

[Series 8: run · 1 of 367 frames shown (8 of 10)]
[frame 184/367]
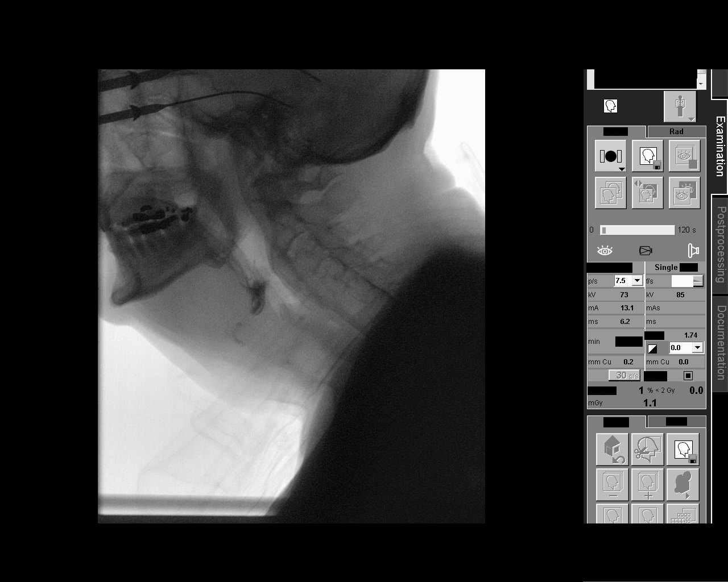

[Series 9: run · 1 of 504 frames shown (9 of 10)]
[frame 119/504]
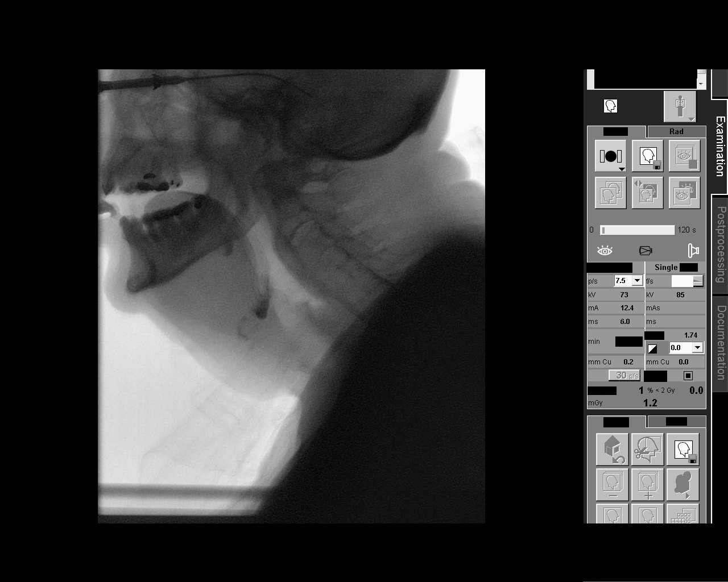

[Series 10: run · 2 of 75 frames shown (10 of 10)]
[frame 12/75]
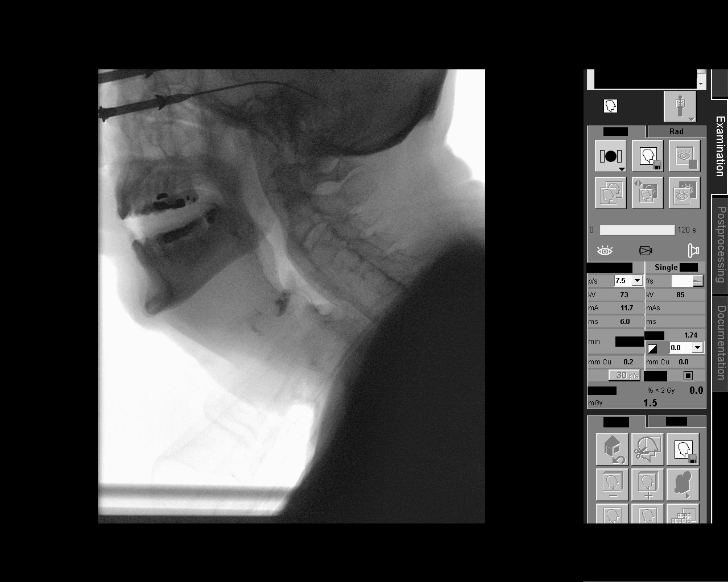
[frame 74/75]
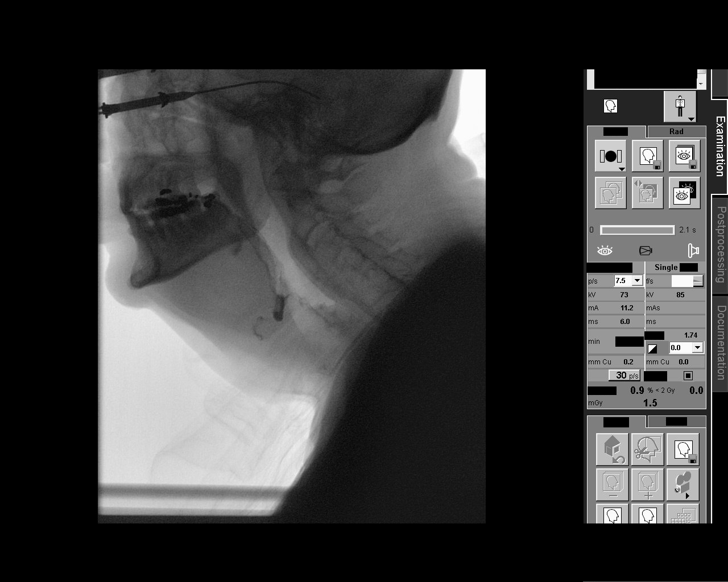

[13 of 24 positions shown; findings below may reference images not displayed]

FINDINGS: Thin liquid-Mild vallecular residual. Otherwise within normal limits

Nectar thick liquid- Mild vallecular residual. Otherwise within
normal limits

Pure- Mild vallecular residual. Otherwise within normal limits

Pure with cracker- Mild vallecular residual. Otherwise within normal
limits
IMPRESSION: Modified barium swallow as described above.

Please refer to the Speech Pathologists report for complete details
and recommendations.

## 2019-01-28 ENCOUNTER — Other Ambulatory Visit (HOSPITAL_COMMUNITY): Payer: Self-pay | Admitting: Critical Care Medicine

## 2019-01-28 DIAGNOSIS — I10 Essential (primary) hypertension: Secondary | ICD-10-CM

## 2019-01-28 DIAGNOSIS — U071 COVID-19: Secondary | ICD-10-CM

## 2019-01-28 NOTE — Progress Notes (Signed)
  I connected by phone with Tony [name] on 01/28/2019 at 6:54 PM to discuss the potential use of an new treatment for mild to moderate COVID-19 viral infection in non-hospitalized patients.  This patient is a 85 y.o. male that meets the FDA criteria for Emergency Use Authorization of bamlanivimab or casirivimab\imdevimab.  Has a (+) direct SARS-CoV-2 viral test result  Has mild or moderate COVID-19   Is ? 85 years of age and weighs ? 40 kg  Is NOT hospitalized due to COVID-19  Is NOT requiring oxygen therapy or requiring an increase in baseline oxygen flow rate due to COVID-19  Is within 10 days of symptom onset  Has at least one of the high risk factor(s) for progression to severe COVID-19 and/or hospitalization as defined in EUA.  Specific high risk criteria : >/= 83 yo, HTN,    I have spoken and communicated the following to the patient or parent/caregiver:  1. FDA has authorized the emergency use of bamlanivimab and casirivimab\imdevimab for the treatment of mild to moderate COVID-19 in adults and pediatric patients with positive results of direct SARS-CoV-2 viral testing who are 44 years of age and older weighing at least 40 kg, and who are at high risk for progressing to severe COVID-19 and/or hospitalization.  2. The significant known and potential risks and benefits of bamlanivimab and casirivimab\imdevimab, and the extent to which such potential risks and benefits are unknown.  3. Information on available alternative treatments and the risks and benefits of those alternatives, including clinical trials.  4. Patients treated with bamlanivimab and casirivimab\imdevimab should continue to self-isolate and use infection control measures (e.g., wear mask, isolate, social distance, avoid sharing personal items, clean and disinfect "high touch" surfaces, and frequent handwashing) according to CDC guidelines.   5. The patient or parent/caregiver has the option to accept or refuse  bamlanivimab or casirivimab\imdevimab .  After reviewing this information with the patient, The patient agreed to proceed with receiving the bamlanimivab infusion and will be provided a copy of the Fact sheet prior to receiving the infusion.[name] 01/28/2019 6:54 PM

## 2019-01-30 ENCOUNTER — Ambulatory Visit
Admission: RE | Admit: 2019-01-30 | Discharge: 2019-01-30 | Disposition: A | Discharge status: Home / Self Care | Payer: Medicare Other | Source: Ambulatory Visit | Attending: Critical Care Medicine | Admitting: Critical Care Medicine

## 2019-01-30 ENCOUNTER — Ambulatory Visit (HOSPITAL_COMMUNITY): Payer: Medicare Other

## 2019-01-30 ENCOUNTER — Ambulatory Visit: Admission: RE | Admit: 2019-01-30 | Payer: Medicare Other | Source: Ambulatory Visit

## 2019-01-30 DIAGNOSIS — U071 COVID-19: Secondary | ICD-10-CM

## 2019-01-30 DIAGNOSIS — I10 Essential (primary) hypertension: Secondary | ICD-10-CM

## 2019-01-30 MED ORDER — SODIUM CHLORIDE 0.9 % IV SOLN
700.0000 mg | Freq: Once | INTRAVENOUS | Status: DC
  Filled 2019-01-30: qty 20

## 2019-03-29 ENCOUNTER — Encounter
Admission: RE | Admit: 2019-03-29 | Discharge: 2019-03-29 | Disposition: A | Discharge status: Home / Self Care | Payer: Medicare Other | Source: Ambulatory Visit | Attending: Internal Medicine | Admitting: Internal Medicine

## 2019-05-25 ENCOUNTER — Other Ambulatory Visit
Admission: RE | Admit: 2019-05-25 | Discharge: 2019-05-25 | Disposition: A | Discharge status: Home / Self Care | Payer: Medicare Other | Source: Ambulatory Visit | Attending: Internal Medicine | Admitting: Internal Medicine

## 2019-05-25 DIAGNOSIS — E876 Hypokalemia: Secondary | ICD-10-CM | POA: Insufficient documentation

## 2019-05-25 LAB — BASIC METABOLIC PANEL
Anion gap: 9 (ref 5–15)
BUN: 31 mg/dL — ABNORMAL HIGH (ref 8–23)
CO2: 24 mmol/L (ref 22–32)
Calcium: 8.3 mg/dL — ABNORMAL LOW (ref 8.9–10.3)
Chloride: 105 mmol/L (ref 98–111)
Creatinine, Ser: 0.94 mg/dL (ref 0.61–1.24)
GFR calc Af Amer: >60 mL/min (ref >60)
GFR calc non Af Amer: >60 mL/min (ref >60)
Glucose, Bld: 94 mg/dL (ref 70–99)
Potassium: 5.5 mmol/L — ABNORMAL HIGH (ref 3.5–5.1)
Sodium: 138 mmol/L (ref 135–145)

## 2019-06-11 ENCOUNTER — Encounter
Admission: RE | Admit: 2019-06-11 | Discharge: 2019-06-11 | Disposition: A | Discharge status: Home / Self Care | Payer: Medicare Other | Source: Ambulatory Visit | Attending: Internal Medicine | Admitting: Internal Medicine

## 2019-07-25 ENCOUNTER — Encounter
Admission: RE | Admit: 2019-07-25 | Discharge: 2019-07-25 | Disposition: A | Discharge status: Home / Self Care | Payer: Medicare Other | Source: Ambulatory Visit | Attending: Internal Medicine | Admitting: Internal Medicine

## 2019-10-16 ENCOUNTER — Encounter
Admission: RE | Admit: 2019-10-16 | Discharge: 2019-10-16 | Disposition: A | Discharge status: Home / Self Care | Payer: Medicare Other | Source: Ambulatory Visit | Attending: Internal Medicine | Admitting: Internal Medicine

## 2020-04-08 DEATH — deceased
